# Patient Record
Sex: Male | Born: 1950 | Race: Black or African American | Hispanic: No
Health system: Southern US, Community
[De-identification: ages and names within clinical notes are randomized; demographics above are authoritative.]

## PROBLEM LIST (undated history)

## (undated) DIAGNOSIS — R0789 Other chest pain: Secondary | ICD-10-CM

## (undated) DIAGNOSIS — G4730 Sleep apnea, unspecified: Secondary | ICD-10-CM

## (undated) DIAGNOSIS — N289 Disorder of kidney and ureter, unspecified: Secondary | ICD-10-CM

## (undated) DIAGNOSIS — Z0000 Encounter for general adult medical examination without abnormal findings: Secondary | ICD-10-CM

## (undated) DIAGNOSIS — K5790 Diverticulosis of intestine, part unspecified, without perforation or abscess without bleeding: Secondary | ICD-10-CM

## (undated) DIAGNOSIS — D509 Iron deficiency anemia, unspecified: Secondary | ICD-10-CM

## (undated) DIAGNOSIS — B019 Varicella without complication: Secondary | ICD-10-CM

## (undated) DIAGNOSIS — E079 Disorder of thyroid, unspecified: Secondary | ICD-10-CM

## (undated) DIAGNOSIS — I10 Essential (primary) hypertension: Secondary | ICD-10-CM

## (undated) DIAGNOSIS — G4733 Obstructive sleep apnea (adult) (pediatric): Secondary | ICD-10-CM

## (undated) DIAGNOSIS — K219 Gastro-esophageal reflux disease without esophagitis: Secondary | ICD-10-CM

## (undated) DIAGNOSIS — E119 Type 2 diabetes mellitus without complications: Secondary | ICD-10-CM

## (undated) HISTORY — DX: Iron deficiency anemia, unspecified: D50.9

## (undated) HISTORY — DX: Encounter for general adult medical examination without abnormal findings: Z00.00

## (undated) HISTORY — DX: Varicella without complication: B01.9

## (undated) HISTORY — DX: Disorder of kidney and ureter, unspecified: N28.9

## (undated) HISTORY — DX: Other chest pain: R07.89

## (undated) HISTORY — DX: Essential (primary) hypertension: I10

## (undated) HISTORY — DX: Type 2 diabetes mellitus without complications: E11.9

## (undated) HISTORY — DX: Obstructive sleep apnea (adult) (pediatric): G47.33

## (undated) HISTORY — PX: KNEE SURGERY: SHX244

## (undated) HISTORY — DX: Gastro-esophageal reflux disease without esophagitis: K21.9

## (undated) HISTORY — DX: Morbid (severe) obesity due to excess calories: E66.01

## (undated) HISTORY — DX: Diverticulosis of intestine, part unspecified, without perforation or abscess without bleeding: K57.90

## (undated) HISTORY — DX: Disorder of thyroid, unspecified: E07.9

## (undated) HISTORY — DX: Sleep apnea, unspecified: G47.30

---

## 2001-08-03 ENCOUNTER — Observation Stay (HOSPITAL_COMMUNITY): Admission: EM | Admit: 2001-08-03 | Discharge: 2001-08-04 | Payer: Self-pay | Admitting: Emergency Medicine

## 2004-03-07 ENCOUNTER — Ambulatory Visit: Payer: Self-pay | Admitting: Internal Medicine

## 2004-05-02 ENCOUNTER — Ambulatory Visit: Payer: Self-pay | Admitting: Internal Medicine

## 2005-03-22 ENCOUNTER — Encounter: Admission: RE | Admit: 2005-03-22 | Discharge: 2005-03-22 | Payer: Self-pay | Admitting: Orthopaedic Surgery

## 2005-03-26 ENCOUNTER — Ambulatory Visit (HOSPITAL_COMMUNITY): Admission: RE | Admit: 2005-03-26 | Discharge: 2005-03-26 | Payer: Self-pay | Admitting: Orthopaedic Surgery

## 2005-03-26 ENCOUNTER — Ambulatory Visit (HOSPITAL_BASED_OUTPATIENT_CLINIC_OR_DEPARTMENT_OTHER): Admission: RE | Admit: 2005-03-26 | Discharge: 2005-03-26 | Payer: Self-pay | Admitting: Orthopaedic Surgery

## 2005-04-12 ENCOUNTER — Ambulatory Visit: Payer: Self-pay | Admitting: Internal Medicine

## 2005-06-03 ENCOUNTER — Ambulatory Visit: Payer: Self-pay | Admitting: Internal Medicine

## 2005-08-26 ENCOUNTER — Ambulatory Visit: Payer: Self-pay | Admitting: Internal Medicine

## 2005-08-29 ENCOUNTER — Ambulatory Visit: Payer: Self-pay | Admitting: Internal Medicine

## 2005-09-04 ENCOUNTER — Emergency Department (HOSPITAL_COMMUNITY): Admission: EM | Admit: 2005-09-04 | Discharge: 2005-09-04 | Payer: Self-pay | Admitting: Emergency Medicine

## 2005-09-16 ENCOUNTER — Ambulatory Visit: Payer: Self-pay | Admitting: Internal Medicine

## 2005-09-18 ENCOUNTER — Encounter: Admission: RE | Admit: 2005-09-18 | Discharge: 2005-09-18 | Payer: Self-pay | Admitting: Internal Medicine

## 2005-10-28 ENCOUNTER — Ambulatory Visit: Payer: Self-pay | Admitting: Internal Medicine

## 2006-04-15 ENCOUNTER — Ambulatory Visit: Payer: Self-pay | Admitting: Internal Medicine

## 2006-04-29 ENCOUNTER — Ambulatory Visit: Payer: Self-pay | Admitting: Pulmonary Disease

## 2006-06-28 ENCOUNTER — Emergency Department (HOSPITAL_COMMUNITY): Admission: EM | Admit: 2006-06-28 | Discharge: 2006-06-28 | Payer: Self-pay | Admitting: Emergency Medicine

## 2006-07-09 ENCOUNTER — Ambulatory Visit: Payer: Self-pay | Admitting: Internal Medicine

## 2006-07-09 LAB — CONVERTED CEMR LAB
BUN: 21 mg/dL (ref 6–23)
Creatinine, Ser: 1.5 mg/dL (ref 0.4–1.5)
GFR calc Af Amer: 62 mL/min
GFR calc non Af Amer: 51 mL/min
Hgb A1c MFr Bld: 6.7 % — ABNORMAL HIGH (ref 4.6–6.0)
Potassium: 3.6 meq/L (ref 3.5–5.1)

## 2006-09-16 ENCOUNTER — Ambulatory Visit: Payer: Self-pay | Admitting: Internal Medicine

## 2006-09-16 LAB — CONVERTED CEMR LAB
Albumin: 3.6 g/dL (ref 3.5–5.2)
Basophils Absolute: 0 10*3/uL (ref 0.0–0.1)
Bilirubin, Direct: 0.1 mg/dL (ref 0.0–0.3)
CRP, High Sensitivity: 4 (ref 0.00–5.00)
Cholesterol: 148 mg/dL (ref 0–200)
Eosinophils Absolute: 0.2 10*3/uL (ref 0.0–0.6)
Eosinophils Relative: 2.9 % (ref 0.0–5.0)
GFR calc non Af Amer: 48 mL/min
Glucose, Bld: 86 mg/dL (ref 70–99)
HCT: 38 % — ABNORMAL LOW (ref 39.0–52.0)
Hemoglobin: 13.1 g/dL (ref 13.0–17.0)
Hgb A1c MFr Bld: 6.6 % — ABNORMAL HIGH (ref 4.6–6.0)
Lymphocytes Relative: 30.5 % (ref 12.0–46.0)
MCHC: 34.4 g/dL (ref 30.0–36.0)
MCV: 76.2 fL — ABNORMAL LOW (ref 78.0–100.0)
Monocytes Absolute: 0.3 10*3/uL (ref 0.2–0.7)
Neutrophils Relative %: 60.8 % (ref 43.0–77.0)
Potassium: 4.5 meq/L (ref 3.5–5.1)
Sodium: 144 meq/L (ref 135–145)
Total Bilirubin: 1 mg/dL (ref 0.3–1.2)
Total CHOL/HDL Ratio: 4.2

## 2006-10-14 ENCOUNTER — Ambulatory Visit: Payer: Self-pay | Admitting: Internal Medicine

## 2006-12-15 ENCOUNTER — Ambulatory Visit: Payer: Self-pay | Admitting: Internal Medicine

## 2006-12-15 LAB — CONVERTED CEMR LAB
Basophils Absolute: 0 10*3/uL (ref 0.0–0.1)
Calcium: 9.7 mg/dL (ref 8.4–10.5)
Chloride: 107 meq/L (ref 96–112)
Creatinine, Ser: 1.6 mg/dL — ABNORMAL HIGH (ref 0.4–1.5)
Eosinophils Absolute: 0.2 10*3/uL (ref 0.0–0.6)
GFR calc non Af Amer: 48 mL/min
HCT: 37.1 % — ABNORMAL LOW (ref 39.0–52.0)
Hgb A1c MFr Bld: 6.7 % — ABNORMAL HIGH (ref 4.6–6.0)
Iron: 66 ug/dL (ref 42–165)
MCHC: 33.3 g/dL (ref 30.0–36.0)
MCV: 77.8 fL — ABNORMAL LOW (ref 78.0–100.0)
Neutrophils Relative %: 63.8 % (ref 43.0–77.0)
Platelets: 230 10*3/uL (ref 150–400)
RBC: 4.76 M/uL (ref 4.22–5.81)
RDW: 14.2 % (ref 11.5–14.6)
Saturation Ratios: 19.4 % — ABNORMAL LOW (ref 20.0–50.0)
Sodium: 145 meq/L (ref 135–145)
Transferrin: 242.9 mg/dL (ref >=212.0)
WBC: 7.3 10*3/uL (ref 4.5–10.5)

## 2007-02-18 DIAGNOSIS — I10 Essential (primary) hypertension: Secondary | ICD-10-CM

## 2007-02-18 DIAGNOSIS — D539 Nutritional anemia, unspecified: Secondary | ICD-10-CM | POA: Insufficient documentation

## 2007-02-18 DIAGNOSIS — E039 Hypothyroidism, unspecified: Secondary | ICD-10-CM | POA: Insufficient documentation

## 2007-02-18 DIAGNOSIS — E1165 Type 2 diabetes mellitus with hyperglycemia: Secondary | ICD-10-CM | POA: Insufficient documentation

## 2007-02-18 DIAGNOSIS — N259 Disorder resulting from impaired renal tubular function, unspecified: Secondary | ICD-10-CM | POA: Insufficient documentation

## 2007-02-18 DIAGNOSIS — E1159 Type 2 diabetes mellitus with other circulatory complications: Secondary | ICD-10-CM

## 2007-07-16 ENCOUNTER — Ambulatory Visit: Payer: Self-pay | Admitting: Pulmonary Disease

## 2007-07-16 ENCOUNTER — Encounter: Payer: Self-pay | Admitting: Adult Health

## 2007-07-16 ENCOUNTER — Ambulatory Visit: Payer: Self-pay | Admitting: Internal Medicine

## 2007-07-16 DIAGNOSIS — R0602 Shortness of breath: Secondary | ICD-10-CM | POA: Insufficient documentation

## 2007-07-20 LAB — CONVERTED CEMR LAB
BUN: 17 mg/dL (ref 6–23)
CO2: 33 meq/L — ABNORMAL HIGH (ref 19–32)
Chloride: 104 meq/L (ref 96–112)
Eosinophils Relative: 2.2 % (ref 0.0–5.0)
GFR calc Af Amer: 57 mL/min
Glucose, Bld: 86 mg/dL (ref 70–99)
Lymphocytes Relative: 26.3 % (ref 12.0–46.0)
Monocytes Absolute: 0.5 10*3/uL (ref 0.1–1.0)
Monocytes Relative: 7.7 % (ref 3.0–12.0)
Neutrophils Relative %: 63.6 % (ref 43.0–77.0)
Platelets: 232 10*3/uL (ref 150–400)
Potassium: 4.3 meq/L (ref 3.5–5.1)
RDW: 14.2 % (ref 11.5–14.6)
Sodium: 144 meq/L (ref 135–145)
WBC: 6.8 10*3/uL (ref 4.5–10.5)

## 2007-07-22 ENCOUNTER — Encounter: Payer: Self-pay | Admitting: Internal Medicine

## 2007-07-22 ENCOUNTER — Ambulatory Visit (HOSPITAL_BASED_OUTPATIENT_CLINIC_OR_DEPARTMENT_OTHER): Admission: RE | Admit: 2007-07-22 | Discharge: 2007-07-22 | Payer: Self-pay | Admitting: Internal Medicine

## 2007-07-28 ENCOUNTER — Ambulatory Visit: Payer: Self-pay | Admitting: Pulmonary Disease

## 2007-08-10 ENCOUNTER — Ambulatory Visit: Payer: Self-pay | Admitting: Internal Medicine

## 2007-08-12 ENCOUNTER — Ambulatory Visit: Payer: Self-pay

## 2007-08-13 ENCOUNTER — Encounter: Payer: Self-pay | Admitting: Adult Health

## 2007-08-13 ENCOUNTER — Ambulatory Visit: Payer: Self-pay

## 2007-08-24 ENCOUNTER — Ambulatory Visit: Payer: Self-pay | Admitting: Internal Medicine

## 2007-08-27 ENCOUNTER — Encounter: Admission: RE | Admit: 2007-08-27 | Discharge: 2007-08-27 | Payer: Self-pay | Admitting: Internal Medicine

## 2007-08-27 ENCOUNTER — Encounter: Payer: Self-pay | Admitting: Internal Medicine

## 2007-08-27 DIAGNOSIS — G4733 Obstructive sleep apnea (adult) (pediatric): Secondary | ICD-10-CM

## 2007-08-28 ENCOUNTER — Telehealth (INDEPENDENT_AMBULATORY_CARE_PROVIDER_SITE_OTHER): Payer: Self-pay | Admitting: *Deleted

## 2007-09-15 ENCOUNTER — Encounter: Payer: Self-pay | Admitting: Internal Medicine

## 2007-10-05 ENCOUNTER — Ambulatory Visit: Payer: Self-pay | Admitting: Pulmonary Disease

## 2007-10-11 ENCOUNTER — Encounter: Payer: Self-pay | Admitting: Pulmonary Disease

## 2007-10-16 ENCOUNTER — Telehealth (INDEPENDENT_AMBULATORY_CARE_PROVIDER_SITE_OTHER): Payer: Self-pay | Admitting: *Deleted

## 2007-10-28 ENCOUNTER — Telehealth (INDEPENDENT_AMBULATORY_CARE_PROVIDER_SITE_OTHER): Payer: Self-pay | Admitting: *Deleted

## 2007-11-05 ENCOUNTER — Ambulatory Visit: Payer: Self-pay | Admitting: Internal Medicine

## 2007-11-05 DIAGNOSIS — K589 Irritable bowel syndrome without diarrhea: Secondary | ICD-10-CM | POA: Insufficient documentation

## 2007-11-05 LAB — CONVERTED CEMR LAB
Alkaline Phosphatase: 65 units/L (ref 39–117)
Basophils Absolute: 0 10*3/uL (ref 0.0–0.1)
Bilirubin, Direct: 0.1 mg/dL (ref 0.0–0.3)
Calcium: 9.6 mg/dL (ref 8.4–10.5)
Cholesterol: 144 mg/dL (ref 0–200)
GFR calc Af Amer: 54 mL/min
HCT: 36.6 % — ABNORMAL LOW (ref 39.0–52.0)
HDL: 37.3 mg/dL — ABNORMAL LOW (ref >39.0)
Hemoglobin: 12.4 g/dL — ABNORMAL LOW (ref 13.0–17.0)
LDL Cholesterol: 96 mg/dL (ref 0–99)
Lymphocytes Relative: 22.7 % (ref 12.0–46.0)
MCHC: 33.8 g/dL (ref 30.0–36.0)
Monocytes Absolute: 0.4 10*3/uL (ref 0.1–1.0)
Neutro Abs: 4.1 10*3/uL (ref 1.4–7.7)
Platelets: 233 10*3/uL (ref 150–400)
Potassium: 4 meq/L (ref 3.5–5.1)
RDW: 14.6 % (ref 11.5–14.6)
Sodium: 140 meq/L (ref 135–145)
Total Bilirubin: 1.1 mg/dL (ref 0.3–1.2)
Total CHOL/HDL Ratio: 3.9
Triglycerides: 56 mg/dL (ref 0–149)

## 2007-11-11 ENCOUNTER — Encounter: Payer: Self-pay | Admitting: Internal Medicine

## 2007-11-19 ENCOUNTER — Ambulatory Visit: Payer: Self-pay | Admitting: Internal Medicine

## 2007-11-23 LAB — CONVERTED CEMR LAB: Hgb A1c MFr Bld: 6.2 % — ABNORMAL HIGH (ref 4.6–6.0)

## 2007-12-09 ENCOUNTER — Encounter: Payer: Self-pay | Admitting: Internal Medicine

## 2008-08-03 ENCOUNTER — Ambulatory Visit: Payer: Self-pay | Admitting: Internal Medicine

## 2008-08-03 LAB — CONVERTED CEMR LAB
Creatinine,U: 169.1 mg/dL
Microalb Creat Ratio: 1.8 mg/g (ref 0.0–30.0)
Microalb, Ur: 0.3 mg/dL (ref 0.0–1.9)

## 2008-08-04 ENCOUNTER — Encounter: Payer: Self-pay | Admitting: Adult Health

## 2008-08-04 ENCOUNTER — Ambulatory Visit: Payer: Self-pay | Admitting: Pulmonary Disease

## 2008-08-04 DIAGNOSIS — R42 Dizziness and giddiness: Secondary | ICD-10-CM

## 2008-08-09 ENCOUNTER — Ambulatory Visit: Payer: Self-pay | Admitting: Internal Medicine

## 2008-08-09 LAB — CONVERTED CEMR LAB
Basophils Absolute: 0.1 10*3/uL (ref 0.0–0.1)
Basophils Relative: 0.8 % (ref 0.0–3.0)
Bilirubin Urine: NEGATIVE
Calcium: 9.6 mg/dL (ref 8.4–10.5)
Eosinophils Absolute: 0.2 10*3/uL (ref 0.0–0.7)
Eosinophils Relative: 3 % (ref 0.0–5.0)
GFR calc non Af Amer: 61.52 mL/min (ref >60)
Hgb A1c MFr Bld: 6.3 % (ref 4.6–6.5)
Ketones, ur: NEGATIVE mg/dL
Leukocytes, UA: NEGATIVE
Lymphocytes Relative: 22.1 % (ref 12.0–46.0)
MCHC: 34.7 g/dL (ref 30.0–36.0)
Monocytes Relative: 6.7 % (ref 3.0–12.0)
Neutrophils Relative %: 67.4 % (ref 43.0–77.0)
RBC: 4.79 M/uL (ref 4.22–5.81)
Sodium: 141 meq/L (ref 135–145)
Specific Gravity, Urine: 1.01 (ref 1.000–1.030)
TSH: 5.85 microintl units/mL — ABNORMAL HIGH (ref 0.35–5.50)
Urine Glucose: NEGATIVE mg/dL
WBC: 8 10*3/uL (ref 4.5–10.5)
pH: 7 (ref 5.0–8.0)

## 2008-10-03 ENCOUNTER — Encounter: Payer: Self-pay | Admitting: Pulmonary Disease

## 2008-10-04 ENCOUNTER — Ambulatory Visit: Payer: Self-pay | Admitting: Pulmonary Disease

## 2009-01-01 ENCOUNTER — Encounter: Payer: Self-pay | Admitting: Pulmonary Disease

## 2009-01-23 ENCOUNTER — Encounter: Payer: Self-pay | Admitting: Internal Medicine

## 2009-02-17 ENCOUNTER — Encounter: Payer: Self-pay | Admitting: Pulmonary Disease

## 2009-06-13 ENCOUNTER — Telehealth (INDEPENDENT_AMBULATORY_CARE_PROVIDER_SITE_OTHER): Payer: Self-pay | Admitting: *Deleted

## 2009-06-13 ENCOUNTER — Ambulatory Visit: Payer: Self-pay | Admitting: Internal Medicine

## 2009-06-13 DIAGNOSIS — N401 Enlarged prostate with lower urinary tract symptoms: Secondary | ICD-10-CM

## 2009-06-13 DIAGNOSIS — E7800 Pure hypercholesterolemia, unspecified: Secondary | ICD-10-CM

## 2009-06-13 LAB — CONVERTED CEMR LAB
ALT: 32 units/L (ref 0–53)
AST: 34 units/L (ref 0–37)
Alkaline Phosphatase: 65 units/L (ref 39–117)
BUN: 14 mg/dL (ref 6–23)
Basophils Relative: 0.8 % (ref 0.0–3.0)
Bilirubin, Direct: 0.2 mg/dL (ref 0.0–0.3)
CRP, High Sensitivity: 6.5 — ABNORMAL HIGH (ref 0.00–5.00)
Calcium: 9.6 mg/dL (ref 8.4–10.5)
Chloride: 108 meq/L (ref 96–112)
Cholesterol: 151 mg/dL (ref 0–200)
Creatinine, Ser: 1.4 mg/dL (ref 0.4–1.5)
Eosinophils Relative: 2.6 % (ref 0.0–5.0)
GFR calc non Af Amer: 66.42 mL/min (ref >60)
Hgb A1c MFr Bld: 6.7 % — ABNORMAL HIGH (ref 4.6–6.5)
Ketones, ur: NEGATIVE mg/dL
LDL Cholesterol: 94 mg/dL (ref 0–99)
Leukocytes, UA: NEGATIVE
Lymphocytes Relative: 32.2 % (ref 12.0–46.0)
Monocytes Absolute: 0.3 10*3/uL (ref 0.1–1.0)
Monocytes Relative: 5 % (ref 3.0–12.0)
Neutrophils Relative %: 59.4 % (ref 43.0–77.0)
Platelets: 205 10*3/uL (ref 150.0–400.0)
Pro B Natriuretic peptide (BNP): 26 pg/mL (ref 0.0–100.0)
RBC: 4.98 M/uL (ref 4.22–5.81)
Specific Gravity, Urine: 1.02 (ref 1.000–1.030)
Total Bilirubin: 0.9 mg/dL (ref 0.3–1.2)
Total CHOL/HDL Ratio: 3
Total Protein, Urine: NEGATIVE mg/dL
Total Protein: 7.7 g/dL (ref 6.0–8.3)
Triglycerides: 52 mg/dL (ref 0.0–149.0)
VLDL: 10.4 mg/dL (ref 0.0–40.0)
WBC: 6.5 10*3/uL (ref 4.5–10.5)
pH: 6.5 (ref 5.0–8.0)

## 2009-06-28 ENCOUNTER — Ambulatory Visit: Payer: Self-pay | Admitting: Internal Medicine

## 2009-09-12 ENCOUNTER — Encounter: Payer: Self-pay | Admitting: Internal Medicine

## 2009-09-26 ENCOUNTER — Encounter: Payer: Self-pay | Admitting: Internal Medicine

## 2009-10-06 ENCOUNTER — Emergency Department (HOSPITAL_COMMUNITY): Admission: EM | Admit: 2009-10-06 | Discharge: 2009-10-07 | Payer: Self-pay | Admitting: Emergency Medicine

## 2009-10-24 ENCOUNTER — Ambulatory Visit: Payer: Self-pay | Admitting: Internal Medicine

## 2009-10-24 ENCOUNTER — Telehealth: Payer: Self-pay | Admitting: Internal Medicine

## 2009-10-30 LAB — CONVERTED CEMR LAB
Calcium: 9.7 mg/dL (ref 8.4–10.5)
Chloride: 104 meq/L (ref 96–112)
Creatinine, Ser: 1.6 mg/dL — ABNORMAL HIGH (ref 0.4–1.5)
Hgb A1c MFr Bld: 6 % (ref 4.6–6.5)
Sodium: 140 meq/L (ref 135–145)
TSH: 3.79 microintl units/mL (ref 0.35–5.50)

## 2009-12-12 ENCOUNTER — Encounter: Payer: Self-pay | Admitting: Internal Medicine

## 2010-01-26 ENCOUNTER — Encounter: Payer: Self-pay | Admitting: Internal Medicine

## 2010-02-01 ENCOUNTER — Telehealth: Payer: Self-pay | Admitting: Internal Medicine

## 2010-02-01 ENCOUNTER — Encounter: Payer: Self-pay | Admitting: Internal Medicine

## 2010-02-14 ENCOUNTER — Encounter: Payer: Self-pay | Admitting: Internal Medicine

## 2010-05-10 NOTE — Miscellaneous (Signed)
Summary: change of omeprazole to pantoprozole  Medications Added PANTOPRAZOLE SODIUM 40 MG TBEC (PANTOPRAZOLE SODIUM) take one tablet by mouth once daily       Clinical Lists Changes  Medications: Changed medication from OMEPRAZOLE 20 MG  CPDR (OMEPRAZOLE) Take 1 tablet by mouth once a day to PANTOPRAZOLE SODIUM 40 MG TBEC (PANTOPRAZOLE SODIUM) take one tablet by mouth once daily - Signed Rx of PANTOPRAZOLE SODIUM 40 MG TBEC (PANTOPRAZOLE SODIUM) take one tablet by mouth once daily;  #30 x 5;  Signed;  Entered by: Randell Loop CMA;  Authorized by: Nyoka Cowden MD;  Method used: [facility] [address] [phone]*, [address], [place], [place], [place]  [id number], Ph: [phone] or [phone], Fax: [phone]    Prescriptions: PANTOPRAZOLE SODIUM 40 MG TBEC (PANTOPRAZOLE SODIUM) take one tablet by mouth once daily  #30 x 5   Entered by:   Randell Loop CMA   Authorized by:   Nyoka Cowden MD   Signed by:   Randell Loop CMA on 01/26/2010   Method used:   [facility] to        [facility]  [address] [phone]* (retail)       [address]       [place], [place]  [id number]       Ph: [phone] or [phone]       Fax: [phone]   RxID:   [id number]  this replaces the omeprazole 20mg  Randell Loop [facility]  January 26, 2010 4:25 PM

## 2010-05-10 NOTE — Letter (Signed)
Summary: [facility]   Imported By: Lester Starrucca 10/02/2009 10:45:51  _____________________________________________________________________  External Attachment:    Type:   Image     Comment:   External Document

## 2010-05-10 NOTE — Assessment & Plan Note (Signed)
Summary: Primary svc/ cpx   Copy to:  Matthew Keller Primary Provider/Referring Provider:  [name]   History of Present Illness: 58  yobm  with  AO DM, HTN,and hyperlipidemia, morbid obesity & obstructive sleep apnea. 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  08/04/08 ov  cc  dizziness 5 days ago . He describes loose stools prior to this episode. Put on meclizine which makes him drowsy but helps. c/o Lt tinnitus x 4 y, neg ENT evaluation. On 3 BP meds, Orthostatic on exam with reproducible dizziness. Labs OK, cr 1.5  rec meclizine, no driving while dizzy or using meclizine.  October 04, 2008 Vassie Loll ov Sleep Medicine FU >> dizziness has not recurred, went to [place] for a 9 day trip, took CPAP, using it more than 4h/ night, Epworth Sleepiness Score 5, denies daytime drowsiness or problems driving. Nasal pillows work well, c/o water in tubing sometimes.  June 13, 2009 CPX new doe steps only,  x months otherwise no sob.  Pt denies any significant sore throat, dysphagia, itching, sneezing,  nasal congestion or excess secretions,  fever, chills, sweats, unintended wt loss, pleuritic or exertional cp, hempoptysis, change in activity tolerance  orthopnea pnd or leg swelling.  no tia or claudication.  Current Medications (verified): 1)  Omeprazole 20 Mg  Cpdr (Omeprazole) .... Take 1 Tablet By Mouth Once A Day 2)  Diovan Hct 320-25 Mg  Tabs (Valsartan-Hydrochlorothiazide) .... Take 1 Tablet By Mouth Once A Day 3)  Minoxidil 2.5 Mg  Tabs (Minoxidil) .... 2 Tabs Once Daily 4)  Verapamil Hcl Cr 240 Mg  Tbcr (Verapamil Hcl) .... Take 1 Tablet By Mouth Once A Day 5)  Levothyroxine Sodium 50 Mcg Tabs (Levothyroxine Sodium) .... Take 1 Tablet By Mouth Once A Day 6)  Actos 30 Mg  Tabs (Pioglitazone Hcl) .... Take 1 Tablet By Mouth Once A Day 7)  Glimepiride 2 Mg  Tabs (Glimepiride) .... Take 1 Tablet By Mouth Once A Day 8)  Citrucel   Powd (Methylcellulose (Laxative)) .... As Needed 9)  Autocpap ....  Use At Bedtime 10)  Veramyst 27.5 Mcg/spray  Susp (Fluticasone Furoate) .... As Needed For Nasal Congestion 11)  Tylenol 325 Mg  Tabs (Acetaminophen) .... Per Bottle 12)  Sulindac 200 Mg  Tabs (Sulindac) .... Take 1 Tablet By Mouth Two Times A Day As Needed 13)  Onetouch Ultra Test   Strp (Glucose Blood) .... 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 Ross  - LHC 08/03/01 nl coronaries and lv fn, lvedp 30  ~Cardiac w/u by Dr Tenny Craw 5/09 neg cardiolite for ischemia, rec risk reduction  DIVERTICULOSIS  colonoscopy 9/01..............................................................R  Kaplan  Severe OSA- on cpap, sleep study 09/2007,..................................................Marland KitchenDr. Vassie Loll       (HE IS COMMERCIAL BUS DRIVER) HEALTH MAINTENANCE..................................................................................Marland KitchenWert     - Pneumovax 09/2006     - Td 04/2004     - CPX June 13, 2009   Past Pulmonary History:  Pulmonary History: PSG shows obstructive sleep apnea with AHI 12/h, increases during REM to 50/h, lowest desaturation 75% c/w severe OSA. 6/09>> set up auto CPAP 5-20 cm, humidifier & nasal pillows  7/09>>reviewed auto CPAP download 5/29 to 7/5 , poor compliance , pressure 11.4  Family History: Reviewed history from 11/05/2007 and no changes required. Throat ca in brother who smoked Etoh in one brother neg premature ascvd DM in mother  Social History: Reviewed history from 08/03/2008 and no changes required. 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 Nl external ear canals without cough reflex NECK :  without JVD/Nodes/TM/ nl carotid upstrokes bilaterally LUNGS: no acc muscle use, clear to A and P bilaterally without cough on insp or exp maneuvers CV:  RRR  no s3 or murmur or increase in P2, no edema  ABD:  soft and nontender with nl excursion in the supine position. No bruits or organomegaly, bowel sounds nl MS:  warm without deformities, calf tenderness, cyanosis or clubbing SKIN: warm and dry without lesions   NEURO:  alert, approp, no deficits GU testes down bilaterally no nodules no IH Rectal mild bph, no mass. stool g neg    Cholesterol               151 mg/dL                   1-610     ATP III Classification            Desirable:  < 200 mg/dL                    Borderline High:  200 - 239 mg/dL               High:  > = 240 mg/dL   Triglycerides             52.0 mg/dL                  9.6-045.4     Normal:  <150 mg/dL     Borderline High:  098 - 199 mg/dL   HDL                       11.91 mg/dL                 >47.82   VLDL Cholesterol          10.4 mg/dL                  9.5-62.1   LDL Cholesterol           94 mg/dL                    3-08  CHO/HDL Ratio:  CHD Risk  3                    Men          Women     1/2 Average Risk     3.4          3.3     Average Risk          5.0          4.4     2X  Average Risk          9.6          7.1     3X Average Risk          15.0          11.0                           Tests: (2) BMP (METABOL)   Sodium                    143 mEq/L                   135-145   Potassium                 4.0 mEq/L                   3.5-5.1   Chloride                  108 mEq/L                   96-112   Carbon Dioxide       [H]  33 mEq/L                    19-32   Glucose              [H]  135 mg/dL                   21-30   BUN                       14 mg/dL                    8-65   Creatinine                1.4 mg/dL                   7.8-4.6   Calcium                   9.6 mg/dL                   9.6-29.5   GFR                       66.42 mL/min                >60  Tests: (3) CBC Platelet w/Diff (CBCD)   White Cell Count          6.5 K/uL                    4.5-10.5   Red Cell Count            4.98 Mil/uL  4.22-5.81   Hemoglobin                13.2 g/dL                   29.5-28.4   Hematocrit                40.3 %                      39.0-52.0   MCV                       80.9 fl                     78.0-100.0   MCHC                      32.7 g/dL                   13.2-44.0   RDW                       14.1 %                      11.5-14.6   Platelet Count            205.0 K/uL                  150.0-400.0   Neutrophil %              59.4 %                      43.0-77.0   Lymphocyte %              32.2 %                      12.0-46.0   Monocyte %                5.0 %                       3.0-12.0   Eosinophils%              2.6 %                       0.0-5.0   Basophils %               0.8 %                       0.0-3.0   Neutrophill Absolute      3.8 K/uL                    1.4-7.7   Lymphocyte Absolute       2.1 K/uL                    0.7-4.0   Monocyte Absolute         0.3 K/uL                    0.1-1.0  Eosinophils, Absolute                             0.2 K/uL  0.0-0.7   Basophils Absolute        0.1 K/uL                     0.0-0.1  Tests: (4) Hepatic/Liver Function Panel (HEPATIC)   Total Bilirubin           0.9 mg/dL                   0.4-5.4   Direct Bilirubin          0.2 mg/dL                   0.9-8.1   Alkaline Phosphatase      65 U/L                      39-117   AST                       34 U/L                      0-37   ALT                       32 U/L                      0-53   Total Protein             7.7 g/dL                    1.9-1.4   Albumin                   3.7 g/dL                    7.8-2.9  Tests: (5) TSH (TSH)   FastTSH                   3.72 uIU/mL                 0.35-5.50  Tests: (6) Full Range CRP (FCRP)   Full Range CRP       [H]  6.50 mg/L                   0.00-5.00     Note:  An elevated hs-CRP (>5 mg/L) should be repeated after 2 weeks to rule out recent infection or trauma.  Tests: (7) UDip Only (UDIP)   Color                     YELLOW       RANGE:  Yellow;Lt. Yellow   Clarity                   CLEAR                       Clear   Specific Gravity          1.020                       1.000 - 1.030   Urine Ph                  6.5  5.0-8.0   Protein                   NEGATIVE                    Negative   Urine Glucose             NEGATIVE                    Negative   Ketones                   NEGATIVE                    Negative   Urine Bilirubin           NEGATIVE                    Negative   Blood                     NEGATIVE                    Negative   Urobilinogen              0.2                         0.0 - 1.0   Leukocyte Esterace        NEGATIVE                    Negative   Nitrite                   NEGATIVE                    Negative  Tests: (8) Prostate Specific Antigen (PSA)   PSA-Hyb                   0.38 ng/mL                  0.10-4.00  Tests: (9) Hemoglobin A1C (A1C)   Hemoglobin A1C       [H]  6.7 %                       4.6-6.5  CXR  Procedure date:  06/13/2009  Findings:      Comparison:  11/05/2007   Findings: Decreased lung volumes with minimal basilar atelectasis. Normal heart size and vascularity.  No new superimposed pneumonia, edema, effusion or pneumothorax.  Midline trachea.  Overall stable exam.   IMPRESSION: Stable low volume exam with atelectasis  EKG  Procedure date:  06/13/2009  Findings:      nsr, wnl  Impression & Recommendations:  Problem # 1:  HYPERCHOLESTEROLEMIA (ICD-272.0)  Target < 70 LDL because of dm  Labs Reviewed: SGOT: 27 (11/05/2007)   SGPT: 23 (11/05/2007), wet   HDL:37.3 (11/05/2007), 35.4 (09/16/2006)  LDL:96 (11/05/2007),100 (09/16/2006)   LDL June 13, 2009 = 94  Chol:144 (11/05/2007), 148 (09/16/2006)  Trig:56 (11/05/2007), 63 (09/16/2006)  Problem # 2:  DYSPNEA (ICD-786.05) c/w deconditioning, reconditioning discussed.   Problem # 3:  DIABETES MELLITUS (ICD-250.00)  His updated medication list for this problem includes:    Actos 30 Mg Tabs (Pioglitazone hcl) .[name]... Take 1 tablet by mouth once a day    Glimepiride 2 Mg Tabs (Glimepiride) .[name]... Take  1 tablet by mouth once a day  Orders: Nutrition Referral (Nutrition) TLB-A1C / Hgb A1C (Glycohemoglobin) (83036-A1C)  Problem # 4:  RENAL INSUFFICIENCY (ICD-588.9) creat 1.5 > 1.4 June 13, 2009   Problem # 5:  HYPOTHYROIDISM, BORDERLINE (ICD-244.9)  His updated medication list for this problem includes:    Levothyroxine Sodium 50 Mcg Tabs (Levothyroxine sodium) .[name]... Take 1 tablet by mouth once a day  Labs Reviewed: TSH: 5.85 (08/03/2008)   > 3.72  June 13, 2009  HgBA1c: 6.3 (08/03/2008) Chol: 144 (11/05/2007)   HDL: 37.3 (11/05/2007)   LDL: 96 (11/05/2007)   TG: 56 (11/05/2007)  Other Orders: EKG w/ Interpretation (93000) T-2 View CXR (71020TC) TLB-Lipid Panel (80061-LIPID) TLB-BMP (Basic Metabolic Panel-BMET) (80048-METABOL) TLB-CBC Platelet - w/Differential (85025-CBCD) TLB-Hepatic/Liver Function Pnl (80076-HEPATIC) TLB-TSH (Thyroid Stimulating Hormone)  (84443-TSH) TLB-CRP-High Sensitivity (C-Reactive Protein) (86140-FCRP) TLB-Udip ONLY (81003-UDIP) TLB-PSA (Prostate Specific Antigen) (84153-PSA) TLB-BNP (B-Natriuretic Peptide) (83880-BNPR) Est. Patient 40-64 years (96295)  Patient Instructions: 1)  See Tammy NP w/in 2 weeks with all your medications, even over the counter meds, separated in two separate bags, the ones you take no matter what vs the ones you stop once you feel better and take only as needed.  She will generate for you a new user friendly medication calendar that will put [place] all on the same page re: your medication use. She will also go over your labs 2)  See Patient Care Coordinator before leaving for nutrition with a food diary 3)  Weight control is simply a matter of calorie balance which needs to be tilted in your favor by eating less and exercising more.  To get the most out of exercise, you need to be continuously aware that you are short of breath, but never out of breath, for 30 minutes daily. As you improve, it will actually be easier for you to do the same amount in  30 minutes so always push to the level where you are short of breath.    [facility] ECG  ID: [id number] Patient: Matthew Keller, Matthew Keller DOB: April 06, 1951 Age: 60 Years Old Sex: Male Race: Black Height: 71 Weight: 303 Status: Unconfirmed Past Medical History:  RENAL INSUFFICIENCY (ICD-588.9) Baseline 1.6 Creat DIABETES MELLITUS (ICD-250.00) ANEMIA, MICROCYTIC (ICD-281.9) HYPOTHYROIDISM, BORDERLINE (ICD-244.9) HYPERTENSION (ICD-401.9) MORBID OBESITY (ICD-278.01)   - All time high 310 2009   - Target wt  =  208  for BMI < 30 Atypical CP........................................................................................................P. Ross  - LHC 08/03/01 nl coronaries and lv fn, lvedp 30  ~Cardiac w/u by Dr Tenny Craw 5/09 neg cardiolite for ischemia, rec risk reduction  DIVERTICULOSIS  colonoscopy  9/01..............................................................R  Kaplan  Severe OSA- on cpap, sleep study 09/2007,..................................................Marland KitchenDr. Vassie Loll       (HE IS COMMERCIAL BUS DRIVER) HEALTH MAINTENANCE..................................................................................Marland KitchenWert  Recorded: 06/13/2009 09:18 AM P/PR: 113 ms / 157 ms - Heart rate (maximum exercise) QRS: 83 QT/QTc/QTd: 415 ms / 413 ms / 63 ms - Heart rate (maximum exercise)  P/QRS/T axis: 69 deg / 49 deg / 35 deg - Heart rate (maximum exercise)  Heartrate: 59 bpm  Interpretation:  nsr, wnl   CXR  Procedure date:  06/13/2009  Findings:      Comparison: 11/05/2007   Findings: Decreased lung volumes with minimal basilar atelectasis. Normal heart size and vascularity.  No new superimposed pneumonia, edema, effusion or pneumothorax.  Midline trachea.  Overall stable exam.   IMPRESSION: Stable low volume exam with atelectasis  EKG  Procedure date:  06/13/2009  Findings:  nsr, wnl

## 2010-05-10 NOTE — Assessment & Plan Note (Signed)
Summary: NP follow up - med calendar   Copy to:  Jarold Motto Primary Provider/Referring Provider:  [name]  CC:  est med calendar - pt did not bring any meds today.  no complaints..  History of Present Illness: 58  yobm  with  AO DM, HTN,and hyperlipidemia, morbid obesity & obstructive sleep apnea. 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  08/04/08 ov  cc  dizziness 5 days ago . He describes loose stools prior to this episode. Put on meclizine which makes him drowsy but helps. c/o Lt tinnitus x 4 y, neg ENT evaluation. On 3 BP meds, Orthostatic on exam with reproducible dizziness. Labs OK, cr 1.5  rec meclizine, no driving while dizzy or using meclizine.  October 04, 2008 Vassie Loll ov Sleep Medicine FU >> dizziness has not recurred, went to [place] for a 9 day trip, took CPAP, using it more than 4h/ night, Epworth Sleepiness Score 5, denies daytime drowsiness or problems driving. Nasal pillows work well, c/o water in tubing sometimes.  June 13, 2009 CPX new doe steps only,  x months otherwise no sob.  Pt denies any significant sore throat, dysphagia, itching, sneezing,  nasal congestion or excess secretions,  fever, chills, sweats, unintended wt loss, pleuritic or exertional cp, hempoptysis, change in activity tolerance  orthopnea pnd or leg swelling.  no tia or claudication.  June 28, 2009-Returns for follow up and med review. Unfortunately he did not bring meds today, we reviewed his list and update his med calendar. Gave him a new copy to bring along with his meds next ov. He has been doing well really working on diet, lost 8lbs, has had no fried foods. . Tolerating exericse in gym. Labs last visit showed good cholestrol on diet only. CRP was at  ~6. , A1C 6.7. Talked about diet, sweets and exercise. Denies chest pain, dyspnea, orthopnea, hemoptysis, fever, n/v/d, edema, headache.  Medications Prior to Update: 1)  Omeprazole 20 Mg  Cpdr (Omeprazole) .... Take 1 Tablet By Mouth Once A  Day 2)  Diovan Hct 320-25 Mg  Tabs (Valsartan-Hydrochlorothiazide) .... Take 1 Tablet By Mouth Once A Day 3)  Minoxidil 2.5 Mg  Tabs (Minoxidil) .... 2 Tabs Once Daily 4)  Verapamil Hcl Cr 240 Mg  Tbcr (Verapamil Hcl) .... Take 1 Tablet By Mouth Once A Day 5)  Levothyroxine Sodium 50 Mcg Tabs (Levothyroxine Sodium) .... Take 1 Tablet By Mouth Once A Day 6)  Actos 30 Mg  Tabs (Pioglitazone Hcl) .... Take 1 Tablet By Mouth Once A Day 7)  Glimepiride 2 Mg  Tabs (Glimepiride) .... Take 1 Tablet By Mouth Once A Day 8)  Citrucel   Powd (Methylcellulose (Laxative)) .... As Needed 9)  Autocpap .... Use At Bedtime 10)  Veramyst 27.5 Mcg/spray  Susp (Fluticasone Furoate) .... As Needed For Nasal Congestion 11)  Tylenol 325 Mg  Tabs (Acetaminophen) .... Per Bottle 12)  Sulindac 200 Mg  Tabs (Sulindac) .... Take 1 Tablet By Mouth Two Times A Day As Needed 13)  Onetouch Ultra Test   Strp (Glucose Blood) .... Use As Directed  Current Medications (verified): 1)  Omeprazole 20 Mg  Cpdr (Omeprazole) .... Take 1 Tablet By Mouth Once A Day 2)  Diovan Hct 320-25 Mg  Tabs (Valsartan-Hydrochlorothiazide) .... Take 1 Tablet By Mouth Once A Day 3)  Minoxidil 2.5 Mg  Tabs (Minoxidil) .... 2 Tabs Once Daily 4)  Verapamil Hcl Cr 240 Mg  Tbcr (Verapamil Hcl) .Marland KitchenMarland KitchenMarland Kitchen  Take 1 Tablet By Mouth Once A Day 5)  Levothyroxine Sodium 50 Mcg Tabs (Levothyroxine Sodium) .... Take 1 Tablet By Mouth Once A Day 6)  Actos 30 Mg  Tabs (Pioglitazone Hcl) .... Take 1 Tablet By Mouth Once A Day 7)  Glimepiride 2 Mg  Tabs (Glimepiride) .... Take 1 Tablet By Mouth Once A Day 8)  Citrucel   Powd (Methylcellulose (Laxative)) .... As Needed 9)  Autocpap .... Use At Bedtime 10)  Veramyst 27.5 Mcg/spray  Susp (Fluticasone Furoate) .... As Needed For Nasal Congestion 11)  Tylenol 325 Mg  Tabs (Acetaminophen) .... Per Bottle 12)  Sulindac 200 Mg  Tabs (Sulindac) .... Take 1 Tablet By Mouth Two Times A Day As Needed 13)  Onetouch Ultra Test   Strp  (Glucose Blood) .... 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 Ross  - LHC 08/03/01 nl coronaries and lv fn, lvedp 30  ~Cardiac w/u by Dr Tenny Craw 5/09 neg cardiolite for ischemia, rec risk reduction  DIVERTICULOSIS  colonoscopy 9/01..............................................................R  Kaplan  Severe OSA- on cpap, sleep study 09/2007,..................................................Marland KitchenDr. 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 6/09>> set up auto CPAP 5-20 cm, humidifier & nasal pillows  7/09>>reviewed auto CPAP download 5/29 to 7/5 , poor compliance , pressure 11.4  Review of Systems      See  HPI  Vital Signs:  Patient profile:   60 year old male Height:      71 inches Weight:      295 pounds BMI:     41.29 O2 Sat:      98 % on Room air Temp:     97.0 degrees F oral Pulse rate:   67 / minute BP sitting:   146 / 86  (left arm) Cuff size:   regular  Vitals Entered By: Boone Master CNA (June 28, 2009 9:49 AM)  O2 Flow:  Room air CC: est med calendar - pt did not bring any meds today.  no complaints. Is Patient Diabetic? Yes Comments Medications reviewed with patient Daytime contact number verified with patient. Boone Master CNA  June 28, 2009 9:48 AM    Physical Exam  Additional Exam:  wt 303 > 303 June 13, 2009>>295 June 28, 2009  obese amb bm nad HEENT: nl dentition, turbinates, and orophanx. Nl external ear canals without cough reflex NECK :  without JVD/Nodes/TM/ nl  carotid upstrokes bilaterally LUNGS: no acc muscle use, clear to A and P bilaterally without cough on insp or exp maneuvers CV:  RRR  no s3 or murmur or increase in P2, no edema  ABD:  soft and nontender with nl excursion in the supine position. No bruits or organomegaly, bowel sounds nl MS:  warm without deformities, calf tenderness, cyanosis or clubbing SKIN: warm and dry without lesions   NEURO:  alert, approp, no deficits     Impression & Recommendations:  Problem # 1:  HYPERCHOLESTEROLEMIA (ICD-272.0)  controlled on diet. LDL goal <70-100. would prefer to be <70 but for now cont w/ diet/exercise.  may need to add statin at some point w/ crp at  ~6 Labs Reviewed: SGOT: 34 (06/13/2009)   SGPT: 32 (06/13/2009)   HDL:46.30 (06/13/2009), 37.3 (11/05/2007)  LDL:94 (06/13/2009), 96 (11/05/2007)  Chol:151 (06/13/2009), 144 (11/05/2007)  Trig:52.0 (06/13/2009), 56 (11/05/2007)  Orders: Est. Patient Level IV (16109)  Problem # 2:  OBSTRUCTIVE SLEEP APNEA (ICD-327.23)  cont at bedtime CPAP  Orders: Est. Patient Level IV (60454)  Problem # 3:  DIABETES MELLITUS  (ICD-250.00)  controlled on rx. diet and exer cise. His updated medication list for this problem includes:    Actos 30 Mg Tabs (Pioglitazone hcl) .[name]... Take 1 tablet by mouth once a day    Glimepiride 2 Mg Tabs (Glimepiride) .[name]... Take 1 tablet by mouth once a day  Orders: Est. Patient Level IV (09811)  Problem # 4:  HYPERTENSION (ICD-401.9)  improved, cont on same meds Meds reviewed with pt education and computerized med calendar adjusted.  His updated medication list for this problem includes:    Diovan Hct 320-25 Mg Tabs (Valsartan-hydrochlorothiazide) .[name]... Take 1 tablet by mouth once a day    Minoxidil 2.5 Mg Tabs (Minoxidil) .[name]... 2 tabs once daily    Verapamil Hcl Cr 240 Mg Tbcr (Verapamil hcl) .[name]... Take 1 tablet by mouth once a day  BP today: 146/86 Prior BP: 176/108 (06/13/2009)  Labs Reviewed: K+: 4.0 (06/13/2009) Creat: : 1.4 (06/13/2009)   Chol: 151 (06/13/2009)   HDL: 46.30 (06/13/2009)   LDL: 94 (06/13/2009)   TG: 52.0 (06/13/2009)  Orders: Est. Patient Level IV (91478)  Complete Medication List: 1)  Omeprazole 20 Mg Cpdr (Omeprazole) .... Take 1 tablet by mouth once a day 2)  Diovan Hct 320-25 Mg Tabs (Valsartan-hydrochlorothiazide) .... Take 1 tablet by mouth once a day 3)  Minoxidil 2.5 Mg Tabs (Minoxidil) .... 2 tabs once daily 4)  Verapamil Hcl Cr 240 Mg Tbcr (Verapamil hcl) .... Take 1 tablet by mouth once a day 5)  Levothyroxine Sodium 50 Mcg Tabs (Levothyroxine sodium) .... Take 1 tablet by mouth once a day 6)  Actos 30 Mg Tabs (Pioglitazone hcl) .... Take 1 tablet by mouth once a day 7)  Glimepiride 2 Mg Tabs (Glimepiride) .... Take 1 tablet by mouth once a day 8)  Citrucel Powd (Methylcellulose (laxative)) .... As needed 9)  Autocpap  .... Use at bedtime 10)  Veramyst 27.5 Mcg/spray Susp (Fluticasone furoate) .... As needed for nasal congestion 11)  Tylenol 325 Mg Tabs (Acetaminophen) .... Per bottle 12)  Sulindac 200 Mg Tabs (Sulindac) .... Take 1  tablet by mouth two times a day as needed 13)  Onetouch Ultra Test Strp (Glucose blood) .... Use as directed  Patient Instructions: 1)  Follow med calendar closely and bring to each visit.  2)  follow up in 3 months Dr. [name]  3)  Keep up  good work. Goal is to lose 8lbs by return.   Appended Document: med calendar update Medications Added CITRUCEL   POWD (METHYLCELLULOSE (LAXATIVE)) 1 teaspoon  every morning and at bedtime VERAMYST 27.5 MCG/SPRAY  SUSP (FLUTICASONE FUROATE) 2 puffs each nostril two times a day as needed          Clinical Lists Changes  Medications: Changed medication from CITRUCEL   POWD (METHYLCELLULOSE (LAXATIVE)) as needed to CITRUCEL   POWD (METHYLCELLULOSE (LAXATIVE)) 1 teaspoon  every morning and at bedtime Changed medication from VERAMYST 27.5 MCG/SPRAY  SUSP (FLUTICASONE FUROATE) as needed for nasal congestion to VERAMYST 27.5 MCG/SPRAY  SUSP (FLUTICASONE FUROATE) 2 puffs each nostril two times a day as needed

## 2010-05-10 NOTE — Medication Information (Signed)
Summary: Tax adviser   Imported By: Valinda Hoar 02/14/2010 11:39:08  _____________________________________________________________________  External Attachment:    Type:   Image     Comment:   External Document

## 2010-05-10 NOTE — Assessment & Plan Note (Signed)
Summary: Primary svc/ f/u multiple issues, refer back to nutrition   Visit Type:  Follow-up Copy to:  Jarold Motto Primary Provider/Referring Provider:  [name]  CC:  The patient c/o ringing in his ears for several days. He was seen in ER approx 2 weeks ago for dehydration.[name]  History of Present Illness: 58  yobm  with  AO DM, HTN,and hyperlipidemia, morbid obesity & obstructive sleep apnea. 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  08/04/08 ov  cc  dizziness 5 days ago . He describes loose stools prior to this episode. Put on meclizine which makes him drowsy but helps. c/o Lt tinnitus x 4 y, neg ENT evaluation. On 3 BP meds, Orthostatic on exam with reproducible dizziness. Labs OK, cr 1.5  rec meclizine, no driving while dizzy or using meclizine.  October 04, 2008 Vassie Loll ov Sleep Medicine FU >> dizziness has not recurred, went to [place] for a 9 day trip, took CPAP, using it more than 4h/ night, Epworth Sleepiness Score 5, denies daytime drowsiness or problems driving. Nasal pillows work well, c/o water in tubing sometimes.  June 13, 2009 CPX new doe steps only,  x months otherwise no sob.  Pt denies any significant sore throat, dysphagia, itching, sneezing,  nasal congestion or excess secretions,  fever, chills, sweats, unintended wt loss, pleuritic or exertional cp, hempoptysis, change in activity tolerance  orthopnea pnd or leg swelling.  no tia or claudication.  June 28, 2009-Returns for follow up and med review. Unfortunately he did not bring meds today, we reviewed his list and update his med calendar. Gave him a new copy to bring along with his meds next ov. He has been doing well really working on diet, lost 8lbs, has had no fried foods. . Tolerating exericse in gym. Labs last visit showed good cholestrol on diet only. CRP was at  ~6. , A1C 6.7.   October 24, 2009 The patient c/o ringing in his ears for several days. He was seen in ER approx 2 weeks ago for dehydration, all symtpoms  resolved. Pt denies any significant sore throat, dysphagia, itching, sneezing,  nasal congestion or excess secretions,  fever, chills, sweats, unintended wt loss, pleuritic or exertional cp, hempoptysis, change in activity tolerance  orthopnea pnd or leg swelling   Current Medications (verified): 1)  Omeprazole 20 Mg  Cpdr (Omeprazole) .... Take 1 Tablet By Mouth Once A Day 2)  Diovan Hct 320-25 Mg  Tabs (Valsartan-Hydrochlorothiazide) .... Take 1 Tablet By Mouth Once A Day 3)  Minoxidil 2.5 Mg  Tabs (Minoxidil) .... 2 Tabs Once Daily 4)  Verapamil Hcl Cr 240 Mg  Tbcr (Verapamil Hcl) .... Take 1 Tablet By Mouth Once A Day 5)  Levothyroxine Sodium 50 Mcg Tabs (Levothyroxine Sodium) .... Take 1 Tablet By Mouth Once A Day 6)  Actos 30 Mg  Tabs (Pioglitazone Hcl) .... Take 1 Tablet By Mouth Once A Day 7)  Glimepiride 2 Mg  Tabs (Glimepiride) .... Take 1 Tablet By Mouth Once A Day 8)  Citrucel   Powd (Methylcellulose (Laxative)) .[name].. 1 Teaspoon  Every Morning and At Bedtime 9)  Autocpap .... Use At Bedtime 10)  Veramyst 27.5 Mcg/spray  Susp (Fluticasone Furoate) .... 2 Puffs Each Nostril Two Times A Day As Needed 11)  Tylenol 325 Mg  Tabs (Acetaminophen) .... Per Bottle 12)  Sulindac 200 Mg  Tabs (Sulindac) .... Take 1 Tablet By Mouth Two Times A Day As Needed 13)  Onetouch Ultra Test   Strp (Glucose Blood) .... Use As Directed  Allergies (verified): No Known Drug Allergies  Past History:  Past Medical History: RENAL INSUFFICIENCY (ICD-588.9)      -  Baseline 1.6 Creat > 1.6 October 24, 2009  DIABETES MELLITUS (ICD-250.00) ANEMIA, MICROCYTIC (ICD-281.9) HYPOTHYROIDISM, BORDERLINE (ICD-244.9) HYPERTENSION (ICD-401.9) MORBID OBESITY (ICD-278.01)   - All time high 310 2009   - Target wt  =  208  for BMI < 30   - Referred back to nutrition October 24, 2009 >>> Atypical CP........................................................................................................P. Ross  - LHC 08/03/01  nl coronaries and lv fn, lvedp 30  ~Cardiac w/u by Dr Tenny Craw 5/09 neg cardiolite for ischemia, rec risk reduction  DIVERTICULOSIS  colonoscopy 9/01..............................................................R  Kaplan  Severe OSA- on cpap, sleep study 09/2007,..................................................Marland KitchenDr. Vassie Loll       (HE IS COMMERCIAL BUS DRIVER) HEALTH MAINTENANCE..................................................................................Marland KitchenWert     - Pneumovax 09/2006     - Td 04/2004     - CPX June 13, 2009   Past Pulmonary History:  Pulmonary History: PSG shows obstructive sleep apnea with AHI 12/h, increases during REM to 50/h, lowest desaturation 75% c/w severe OSA. 6/09>> set up auto CPAP 5-20 cm, humidifier & nasal pillows  7/09>>reviewed auto CPAP download 5/29 to 7/5 , poor compliance , pressure 11.4  Vital Signs:  Patient profile:   60 year old male Height:      71 inches (180.34 cm) Weight:      300.13 pounds (136.42 kg) BMI:     42.01 O2 Sat:      93 % on Room air Temp:     98.5 degrees F (36.94 degrees C) oral Pulse rate:   92 / minute BP sitting:   154 / 82  (left arm) Cuff size:   large  Vitals Entered By: Michel Bickers CMA (October 24, 2009 2:11 PM)  O2 Sat at Rest %:  93 O2 Flow:  Room air CC: The patient c/o ringing in his ears for several days. He was seen in ER approx 2 weeks ago for dehydration. Comments Medications reviewed. Daytime phone verified. Michel Bickers [facility]  October 24, 2009 2:12 PM   Physical Exam  Additional Exam:  wt  303 June 13, 2009>> 295 June 28, 2009 > 303 October 24, 2009 > 300 October 24, 2009  obese amb bm nad HEENT: nl dentition, turbinates, and orophanx. Nl external ear canals without cough reflex NECK :  without JVD/Nodes/TM/ nl carotid upstrokes bilaterally LUNGS: no acc muscle use, clear to A and P bilaterally without cough on insp or exp maneuvers CV:  RRR  no s3 or murmur or increase in P2, no edema  ABD:  soft and nontender  with nl excursion in the supine position. No bruits or organomegaly, bowel sounds nl MS:  warm without deformities, calf tenderness, cyanosis or clubbing    Sodium                    140 mEq/L                   135-145   Potassium                 4.1 mEq/L                   3.5-5.1   Chloride                  104 mEq/L  96-112   Carbon Dioxide       [H]  33 mEq/L                    19-32   Glucose              [H]  151 mg/dL                   60-45   BUN                       23 mg/dL                    4-09   Creatinine           [H]  1.6 mg/dL                   8.1-1.9   Calcium                   9.7 mg/dL                   1.4-78.2   GFR                       58.55 mL/min                >60  Tests: (2) Hemoglobin A1C (A1C)   Hemoglobin A1C            6.0 %                       4.6-6.5     Glycemic Control Guidelines for People with Diabetes:     Non Diabetic:  <6%     Goal of Therapy: <7%     Additional Action Suggested:  >8%   Tests: (3) TSH (TSH)   FastTSH                   3.79 uIU/mL                 0.35-5.50  Impression & Recommendations:  Problem # 1:  HYPERTENSION (ICD-401.9)  His updated medication list for this problem includes:    Diovan Hct 320-25 Mg Tabs (Valsartan-hydrochlorothiazide) .[name]... Take 1 tablet by mouth once a day    Minoxidil 2.5 Mg Tabs (Minoxidil) .[name]... 2 tabs once daily    Verapamil Hcl Cr 240 Mg Tbcr (Verapamil hcl) .[name]... Take 1 tablet by mouth once a day  Orders: Est. Patient Level IV (95621)  Problem # 2:  DIABETES MELLITUS (ICD-250.00)  His updated medication list for this problem includes:    Actos 30 Mg Tabs (Pioglitazone hcl) .[name]... Take 1 tablet by mouth once a day    Glimepiride 2 Mg Tabs (Glimepiride) .[name]... Take 1 tablet by mouth once a day   Labs Reviewed: Creat: 1.4 (06/13/2009)    Reviewed HgBA1c results: 6.7 (06/13/2009) > 6.0 October 24, 2009   6.3 (08/03/2008)  Problem # 3:  HYPOTHYROIDISM, BORDERLINE  (ICD-244.9)  His updated medication list for this problem includes:    Levothyroxine Sodium 50 Mcg Tabs (Levothyroxine sodium) .[name]... Take 1 tablet by mouth once a day  Orders: TLB-TSH (Thyroid Stimulating Hormone) (84443-TSH)  Labs Reviewed: TSH: 3.72 (06/13/2009)    HgBA1c: 6.7 (06/13/2009)  > 3.79 October 24, 2009  Chol: 151 (06/13/2009)   HDL: 46.30 (06/13/2009)   LDL: 94 (06/13/2009)  TG: 52.0 (06/13/2009)  Problem # 4:  RENAL INSUFFICIENCY (ICD-588.9) Creat 1.6 > 1.6 October 24, 2009 so no change in rx  Other Orders: TLB-BMP (Basic Metabolic Panel-BMET) (80048-METABOL) TLB-A1C / Hgb A1C (Glycohemoglobin) (83036-A1C) Nutrition Referral (Nutrition)  Patient Instructions: 1)  Return to office in 3 months, sooner if needed  2)  See nutrition with food diary x 2 weeks

## 2010-05-10 NOTE — Progress Notes (Signed)
Summary: PA initiated for Pantoprazole approved  Phone Note Outgoing Call   Call placed by: Michel Bickers CMA,  February 01, 2010 11:07 AM Call placed to: [facility] [phone] Summary of Call: PA initiated for Pantoprazole 40MG  tabs. Case # [id number]. Awaiting fax. Initial call taken by: Michel Bickers CMA,  February 01, 2010 11:07 AM  Follow-up for Phone Call        PA form received and placed in MW look at for his review and signature.Michel Bickers CMA  February 01, 2010 11:22 AM  Cannot locate the form. I have called med recs scan dept to check to see if teh form was sent in his scan folder. will await call back. Carron Curie CMA  February 08, 2010 11:47 AM  Med recs does not have the PA form, so I called [facility] again to check on status and was advised medication had been approved from 01-15-10 to 02-05-11. Pharmacy notified. Carron Curie CMA  February 08, 2010 12:01 PM

## 2010-05-10 NOTE — Progress Notes (Signed)
Summary: Re-refer to Nutrition  Phone Note Call from Patient Call back at Home Phone [phone] Call back at [phone]   Caller: Patient Call For: Matthew Keller Summary of Call: needs to be sset back up with a [facility] nutritionist Initial call taken by: Lacinda Axon,  October 24, 2009 4:42 PM  Follow-up for Phone Call        called and spoke with pt.  pt states he saw MW  today.  Per instructions, pt was to f/u with nutritionist.  Pt states he spoke with the nutritionist at [facility] and  the order from March 2011 that MW had sent for a referral to a nutritionist has expired and pt needs a new referral sent.  Will forward message to MW.  Arman Filter LPN  October 24, 2009 4:49 PM  will reorderd Follow-up by: Nyoka Cowden MD,  October 24, 2009 4:52 PM     Appended Document: Orders Update     Clinical Lists Changes  Orders: Added new Referral order of Nutrition Referral (Nutrition) - Signed

## 2010-05-10 NOTE — Letter (Signed)
Summary: Unable to reach pt/[facility]  Unable to reach pt/[facility]   Imported By: Sherian Rein 09/20/2009 09:06:00  _____________________________________________________________________  External Attachment:    Type:   Image     Comment:   External Document

## 2010-05-10 NOTE — Progress Notes (Signed)
Summary: refills  Phone Note Call from Patient   Caller: Patient Call For: wert Summary of Call: pt need all of his  meds refilled  [facility] [address]  Initial call taken by: Rickard Patience,  June 13, 2009 10:51 AM  Follow-up for Phone Call        pt was just seen by MW today.  Rx sent to pharmacy.  LMOM informing pt rx sent to pharmacy.  Arman Filter LPN  June 13, 1608 10:55 AM     Prescriptions: [place] TEST   STRP (GLUCOSE BLOOD) use as directed  #100 x 11   Entered by:   Arman Filter LPN   Authorized by:   Nyoka Cowden MD   Signed by:   Arman Filter LPN on 96/07/5407   Method used:   [facility]        [facility]  [address] [phone][facility] (retail)       [address]       [place], [place]  [address]       Ph: [phone] or [phone]       Fax: [phone]   RxID:   [id number] SULINDAC 200 MG  TABS (SULINDAC) Take 1 tablet by mouth two times a day as needed  #60 x 5   Entered by:   Arman Filter LPN   Authorized by:   Nyoka Cowden MD   Signed by:   Arman Filter LPN on 87/56/4332   Method used:   [facility]ron[facility]        [facility] [phone]* (retail)       [address]       [place], [place]  [address]       Ph: [phone] or [phone]       Fax: [phone]   RxID:   [id number] GLIMEPIRIDE 2 MG  TABS (GLIMEPIRIDE) Take 1 tablet by mouth once a day  #30 x 5   Entered by:   Arman Filter LPN   Authorized by:   Nyoka Cowden MD   Signed by:   Arman Filter LPN on 94/85/4627   Method used:   Electronically to        [facility] [phone]* (retail)       [address]       [place], [place]  [address]       Ph: [phone] or [phone]       Fax: [phone]   RxID:   [id number] ACTOS 30 MG  TABS (PIOGLITAZONE HCL) Take 1 tablet by mouth once a day  #30 x 5   Entered by:   Arman Filter LPN   Authorized by:    Nyoka Cowden MD   Signed by:   Arman Filter LPN on 19/50/9326   Method used:   Electronically to        [facility] [phone]* (retail)       [address]       [place], [place]  [address]       Ph: [phone] or [phone]       Fax: [phone]   RxID:   [id number] VERAPAMIL HCL CR 240 MG  TBCR (VERAPAMIL HCL) Take 1 tablet by mouth once a day  #  30 x 5   Entered by:   Arman Filter LPN   Authorized by:   Nyoka Cowden MD   Signed by:   Arman Filter LPN on 81/19/1478   Method used:   Electronically to        [facility] [phone]* (retail)       [address]       [place], [place]  [address]       Ph: [phone] or [phone]       Fax: [phone]   RxID:   [id number] MINOXIDIL 2.5 MG  TABS (MINOXIDIL) 2 tabs once daily  #60 x 5   Entered by:   Arman Filter LPN   Authorized by:   Nyoka Cowden MD   Signed by:   Arman Filter LPN on 95/18/8416   Method used:   Electronically to        [facility] [phone]* (retail)       [address]       [place], [place]  [address]       Ph: [phone] or [phone]       Fax: [phone]   RxID:   [id number] DIOVAN HCT 320-25 MG  TABS (VALSARTAN-HYDROCHLOROTHIAZIDE) Take 1 tablet by mouth once a day  #30 x 0   Entered by:   Arman Filter LPN   Authorized by:   Nyoka Cowden MD   Signed by:   Arman Filter LPN on 03/50/0938   Method used:   Electronically to        [facility] [address] [phone]* (retail)       [address]       [place], [place]  [address]       Ph: [phone] or [phone]       Fax: [phone]   RxID:   [id number] OMEPRAZOLE 20 MG  CPDR (OMEPRAZOLE) Take 1 tablet by mouth once a day  #30 x 5   Entered by:   Arman Filter LPN   Authorized by:   Nyoka Cowden MD   Signed by:   Arman Filter LPN on 71/24/5809   Method used:    Electronically to        [facility] [address] [phone]* (retail)       [address]       [place], [place]  [address]       Ph: [phone] or [phone]       Fax: [phone]   RxID:   [id number]

## 2010-05-10 NOTE — Letter (Signed)
Summary: Missed Appt/[employer]Mg[employer]  Missed Appt/[employer]DBS-Mgmt   Imported By: Lester [place] 12/19/2009 08:47:22  _____________________________________________________________________  External Attachment:    Type:   Image     Comment:   External Document

## 2010-05-10 NOTE — Medication Information (Signed)
Summary: Prior Auth/medco  Prior Auth/medco   Imported By: Lester [facility] 02/14/2010 09:01:10  _____________________________________________________________________  External Attachment:    Type:   Image     Comment:   External Document

## 2010-05-15 ENCOUNTER — Telehealth: Payer: Self-pay | Admitting: Internal Medicine

## 2010-05-23 ENCOUNTER — Telehealth (INDEPENDENT_AMBULATORY_CARE_PROVIDER_SITE_OTHER): Payer: Self-pay | Admitting: *Deleted

## 2010-05-23 ENCOUNTER — Ambulatory Visit: Payer: Self-pay | Admitting: Internal Medicine

## 2010-05-24 ENCOUNTER — Telehealth: Payer: Self-pay | Admitting: Internal Medicine

## 2010-05-24 NOTE — Progress Notes (Signed)
Summary: refills of his Verapamil and Actos-  Phone Note Call from Patient   Caller: Patient Call For: Ailynn Gow Summary of Call: patient phoned and he wanted to speak to the nurse. Patient stated that [facility] [address] and they advised him that our office denied his varapamill and the Actos and patient wants to  know why he stated that the doctor told him that he would not deny his blood pressure medications. Patient can be reached at [phone] Patient last seen 10/2009. Patient has been out of his Verapami for three days. Initial call taken by: Vedia Coffer,  May 15, 2010 2:31 PM  Follow-up for Phone Call        LMTCBx1 pt needs appt per refill denial. Carron Curie CMA  May 15, 2010 4:56 PM  pt set to see MW on 05-23-10 at 9:15am. refills sent. Carron Curie CMA  May 15, 2010 5:03 PM     Prescriptions: GLIMEPIRIDE 2 MG  TABS (GLIMEPIRIDE) Take 1 tablet by mouth once a day  #30 Tablet x 0   Entered by:   Carron Curie CMA   Authorized by:   Nyoka Cowden MD   Signed by:   Carron Curie CMA on 05/15/2010   Method used:   Electronically to        [facility] [phone]* (retail)       [address]       [place], [place]  [address]       Ph: [phone] or [phone]       Fax: [phone]   RxID:   [id number] ACTOS 30 MG  TABS (PIOGLITAZONE HCL) Take 1 tablet by mouth once a day  #30 Tablet x 0   Entered by:   Carron Curie CMA   Authorized by:   Nyoka Cowden MD   Signed by:   Carron Curie CMA on 05/15/2010   Method used:   Electronically to        [facility] [phone]* (retail)       [address]       [place], [place]  [address]       Ph: [phone] or [phone]       Fax: [phone]   RxID:   [id number] VERAPAMIL HCL CR 240 MG  TBCR (VERAPAMIL HCL) Take 1 tablet by mouth once a day  #30 Tablet x 0   Entered by:    Carron Curie CMA   Authorized by:   Nyoka Cowden MD   Signed by:   Carron Curie CMA on 05/15/2010   Method used:   Electronically to        [facility] [phone]* (retail)       [address]       [place], [place]  [address]       Ph: [phone] or [phone]       Fax: [phone]   RxID:   [id number] PANTOPRAZOLE SODIUM 40 MG TBEC (PANTOPRAZOLE SODIUM) take one tablet by mouth once daily  #30 x 0   Entered by:   Carron Curie CMA   Authorized by:   Nyoka Cowden MD   Signed by:   Carron Curie CMA on 05/15/2010   Method used:  Electronically to        [facility] [phone]* (retail)       [address]       [place], [place]  [address]       Ph: [phone] or [phone]       Fax: [phone]   RxID:   [id number]

## 2010-05-29 ENCOUNTER — Telehealth (INDEPENDENT_AMBULATORY_CARE_PROVIDER_SITE_OTHER): Payer: Self-pay | Admitting: *Deleted

## 2010-05-30 NOTE — Progress Notes (Signed)
Summary: nos appt  Phone Note Call from Patient   Caller: [email or web address]  Call For: Caroleann Casler Summary of Call: [facility]sc nos from 2/15 to 3/6. Initial call taken by: Darletta Moll,  May 24, 2010 10:14 AM     Appended Document: nos appt see prev phone note

## 2010-06-05 NOTE — Progress Notes (Signed)
Summary: has concerns about Actos and bladder cancer  Phone Note Call from Patient   Caller: Patient Call For: wert Summary of Call: patient phoned stated that had a call from our office wasnt sure if it was regarding an appointment or about a question he had on his Actos because he has been seeing the commerical on TV stating that a side effect is that it can cause bladder cancer and his wife keeps asking him to call and see if still needs to stay on this medicine or if it needs to be changed. Patient stated that he isnt having any problems that he knows about. He can be reached at [phone]  Initial call taken by: Vedia Coffer,  May 29, 2010 11:33 AM  Follow-up for Phone Call        Spoke with pt and advised needs ov per last phone note to discuss this and no need to change meds for now.  Pt verbalized understanding.  I advised we can see him this wk if he can come in and he refused, stating wll need to just keep his sched appt for 3/6.   Follow-up by: Vernie Murders,  May 29, 2010 11:51 AM

## 2010-06-05 NOTE — Progress Notes (Signed)
Summary: NEEDS OV re ? change actos-LMTCBx4  Phone Note Call from Patient   Caller: Patient Call For: wert Summary of Call: pt requests refills of all meds. also wants to discuss actos and side effects "seen on tv". pt # [phone] (cell) [facility] [address] is scheduling pt for rov w/ dr wert for 06/12/10. Initial call taken by: Tivis Ringer, CNA,  May 23, 2010 1:51 PM  Follow-up for Phone Call        LMTCBx1. Carron Curie CMA  May 23, 2010 3:18 PM  Spoke with pt and he saw commercialon TV about Actos and how it has been causing liver and kidney problems in patients and he wants to [facility] what MW thinks about this and if he feels the pt should change medicaitons because he is concerned about the side effects. Please advise. Carron Curie CMA  May 23, 2010 4:17 PM no change needed for now but  strongly advise ov now and every 3 months - he is a complicated pt that needs closer f/u so we can address these issues effectively Follow-up by: Nyoka Cowden MD,  May 24, 2010 9:05 AM  Additional Follow-up for Phone Call Additional follow up Details #1::        LMTCBx1. Carron Curie CMA  May 24, 2010 9:23 AM  [facility] Gweneth Dimitri RN  May 25, 2010 5:36 PM  lmomtcb x 2  need to get pt in for an earlier appointment with MW. Zackery Barefoot CMA  May 28, 2010 5:18 PM   [facility] x 4 and will sign per protocol Vernie Murders  May 29, 2010 11:24 AM

## 2010-06-12 ENCOUNTER — Other Ambulatory Visit: Payer: Self-pay | Admitting: Internal Medicine

## 2010-06-12 ENCOUNTER — Other Ambulatory Visit: Payer: BC Managed Care – PPO

## 2010-06-12 ENCOUNTER — Ambulatory Visit (INDEPENDENT_AMBULATORY_CARE_PROVIDER_SITE_OTHER)
Admission: RE | Admit: 2010-06-12 | Discharge: 2010-06-12 | Disposition: A | Discharge status: Home / Self Care | Payer: BC Managed Care – PPO | Source: Ambulatory Visit | Attending: Internal Medicine | Admitting: Internal Medicine

## 2010-06-12 ENCOUNTER — Ambulatory Visit (INDEPENDENT_AMBULATORY_CARE_PROVIDER_SITE_OTHER): Payer: BC Managed Care – PPO | Admitting: Internal Medicine

## 2010-06-12 ENCOUNTER — Encounter: Payer: Self-pay | Admitting: Internal Medicine

## 2010-06-12 DIAGNOSIS — Z0000 Encounter for general adult medical examination without abnormal findings: Secondary | ICD-10-CM

## 2010-06-12 LAB — CBC WITH DIFFERENTIAL/PLATELET
Basophils Absolute: 0 10*3/uL (ref 0.0–0.1)
Eosinophils Absolute: 0.2 10*3/uL (ref 0.0–0.7)
HCT: 40.2 % (ref 39.0–52.0)
Hemoglobin: 13.5 g/dL (ref 13.0–17.0)
Lymphs Abs: 1.8 10*3/uL (ref 0.7–4.0)
MCHC: 33.6 g/dL (ref 30.0–36.0)
MCV: 78.4 fl (ref 78.0–100.0)
Monocytes Absolute: 0.6 10*3/uL (ref 0.1–1.0)
Neutro Abs: 5.3 10*3/uL (ref 1.4–7.7)
RDW: 15.4 % — ABNORMAL HIGH (ref 11.5–14.6)

## 2010-06-12 LAB — URINALYSIS
Ketones, ur: NEGATIVE
Specific Gravity, Urine: 1.02 (ref 1.000–1.030)
Urine Glucose: NEGATIVE
Urobilinogen, UA: 0.2 (ref 0.0–1.0)

## 2010-06-12 LAB — HEPATIC FUNCTION PANEL
AST: 25 U/L (ref 0–37)
Albumin: 3.9 g/dL (ref 3.5–5.2)

## 2010-06-12 LAB — LIPID PANEL
Cholesterol: 139 mg/dL (ref 0–200)
HDL: 39.3 mg/dL (ref >39.00)

## 2010-06-12 LAB — MICROALBUMIN / CREATININE URINE RATIO
Creatinine,U: 206.7 mg/dL
Microalb, Ur: 2.1 mg/dL — ABNORMAL HIGH (ref 0.0–1.9)

## 2010-06-12 LAB — HIGH SENSITIVITY CRP: CRP, High Sensitivity: 7.44 mg/L — ABNORMAL HIGH (ref 0.00–5.00)

## 2010-06-12 LAB — BASIC METABOLIC PANEL
CO2: 33 mEq/L — ABNORMAL HIGH (ref 19–32)
Glucose, Bld: 116 mg/dL — ABNORMAL HIGH (ref 70–99)
Potassium: 4.4 mEq/L (ref 3.5–5.1)
Sodium: 142 mEq/L (ref 135–145)

## 2010-06-12 LAB — TSH: TSH: 5.24 u[IU]/mL (ref 0.35–5.50)

## 2010-06-13 LAB — CONVERTED CEMR LAB: PSA: 0.27 ng/mL (ref <=4.00)

## 2010-06-14 ENCOUNTER — Ambulatory Visit: Payer: Self-pay | Admitting: Internal Medicine

## 2010-06-15 ENCOUNTER — Telehealth (INDEPENDENT_AMBULATORY_CARE_PROVIDER_SITE_OTHER): Payer: Self-pay | Admitting: *Deleted

## 2010-06-19 NOTE — Progress Notes (Signed)
Summary: needs all of his meds called in he had his physical 3/6  Phone Note Call from Patient Call back at Home Phone [phone]   Caller: Patient Call For: wert Summary of Call: Patient phoned stated that he came in on 3/6 for annual and he is out of his medication and was told at his physical our office would call and renew all of his prescriptions. [facility] [address] and they informed him that our office has not called or sent the prescriptions in. He can be reached at [phone]. He is completely out of his meds and he is going into his third day with no medication.  Initial call taken by: Vedia Coffer,  June 15, 2010 10:37 AM  Follow-up for Phone Call        Rxs were sent at 9:57am; pt aware and states he got his meds.Reynaldo Minium CMA  June 15, 2010 2:35 PM

## 2010-06-19 NOTE — Assessment & Plan Note (Signed)
Summary: Primary svc/ cpx   Copy to:  Jarold Motto Primary Provider/Referring Provider:  [name]  CC:  HTN.  History of Present Illness: 60 yobm  with  AO DM, HTN,and hyperlipidemia, morbid obesity & obstructive sleep apnea. 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  08/04/08 ov  cc  dizziness 5 days ago . He describes loose stools prior to this episode. Put on meclizine which makes him drowsy but helps. c/o Lt tinnitus x 4 y, neg ENT evaluation. On 3 BP meds, Orthostatic on exam with reproducible dizziness. Labs OK, cr 1.5  rec meclizine, no driving while dizzy or using meclizine.  October 04, 2008 Vassie Loll ov Sleep Medicine FU >> dizziness has not recurred, went to [place] for a 9 day trip, took CPAP, using it more than 4h/ night, Epworth Sleepiness Score 5, denies daytime drowsiness or problems driving. Nasal pillows work well, c/o water in tubing sometimes.  June 13, 2009 CPX new doe steps only,  x months otherwise no sob.  Pt denies any significant sore throat, dysphagia, itching, sneezing,  nasal congestion or excess secretions,  fever, chills, sweats, unintended wt loss, pleuritic or exertional cp, hempoptysis, change in activity tolerance  orthopnea pnd or leg swelling.  no tia or claudication.  June 28, 2009-Returns for follow up and med review. Unfortunately he did not bring meds today, we reviewed his list and update his med calendar. Gave him a new copy to bring along with his meds next ov. He has been doing well really working on diet, lost 8lbs, has had no fried foods. . Tolerating exericse in gym. Labs last visit showed good cholestrol on diet only. CRP was at  ~6. , A1C 6.7.   October 24, 2009 The patient c/o ringing in his ears for several days. He was seen in ER approx 2 weeks ago for dehydration, all symtpoms resolved.    June 12, 2010 ov cpx  Current Medications (verified): 1)  Pantoprazole Sodium 40 Mg Tbec (Pantoprazole Sodium) .... Take One Tablet By Mouth Once  Daily 2)  Diovan Hct 320-25 Mg  Tabs (Valsartan-Hydrochlorothiazide) .... Take 1 Tablet By Mouth Once A Day 3)  Minoxidil 2.5 Mg  Tabs (Minoxidil) .... 2 Tabs Once Daily 4)  Verapamil Hcl Cr 240 Mg  Tbcr (Verapamil Hcl) .... Take 1 Tablet By Mouth Once A Day 5)  Levothyroxine Sodium 50 Mcg Tabs (Levothyroxine Sodium) .... Take 1 Tablet By Mouth Once A Day 6)  Actos 30 Mg  Tabs (Pioglitazone Hcl) .... Take 1 Tablet By Mouth Once A Day 7)  Glimepiride 2 Mg  Tabs (Glimepiride) .... Take 1 Tablet By Mouth Once A Day 8)  Citrucel   Powd (Methylcellulose (Laxative)) .[name].. 1 Teaspoon  Every Morning and At Bedtime 9)  Autocpap .... Use At Bedtime 10)  Veramyst 27.5 Mcg/spray  Susp (Fluticasone Furoate) .... 2 Puffs Each Nostril Two Times A Day As Needed 11)  Tylenol 325 Mg  Tabs (Acetaminophen) .... Per Bottle 12)  Sulindac 200 Mg  Tabs (Sulindac) .... Take 1 Tablet By Mouth Two Times A Day As Needed 13)  Onetouch Ultra Test   Strp (Glucose Blood) .... Use As Directed  Allergies (verified): No Known Drug Allergies  Past History:  Past Medical History: RENAL INSUFFICIENCY (ICD-588.9)      -  Baseline  1.6 October 24, 2009 > 1.3 June 12, 2010  DIABETES MELLITUS (ICD-250.00) ANEMIA, MICROCYTIC (ICD-281.9) HYPOTHYROIDISM, BORDERLINE (ICD-244.9) HYPERTENSION (ICD-401.9)  MORBID OBESITY (ICD-278.01)   - All time high 310 2009   - Target wt  =  208  for BMI < 30   - Referred back to nutrition again June 12, 2010  Atypical CP........................................................................................................P. Ross  - LHC 08/03/01 nl coronaries and lv fn, lvedp 30  ~Cardiac w/u by Dr Tenny Craw 5/09 neg cardiolite for ischemia, rec risk reduction  DIVERTICULOSIS  colonoscopy 9/01..............................................................R  Kaplan  Severe OSA- on cpap, sleep study 09/2007,..................................................Marland KitchenDr. Vassie Loll       (HE IS COMMERCIAL BUS  DRIVER) HEALTH MAINTENANCE..................................................................................Marland KitchenWert     - Pneumovax 09/2006     - Td 04/2004     - CPX June 12, 2010   Past Pulmonary History:  Pulmonary History: PSG shows obstructive sleep apnea with AHI 12/h, increases during REM to 50/h, lowest desaturation 75% c/w severe OSA. 6/09>> set up auto CPAP 5-20 cm, humidifier & nasal pillows  7/09>>reviewed auto CPAP download 5/29 to 7/5 , poor compliance , pressure 11.4  Vital Signs:  Patient profile:   60 year old male Weight:      309 pounds O2 Sat:      98 % on Room air Temp:     97.4 degrees F oral Pulse rate:   72 / minute BP sitting:   148 / 96  (left arm) Cuff size:   large  Vitals Entered By: Vernie Murders (June 12, 2010 8:51 AM)  O2 Flow:  Room air  Physical Exam  Additional Exam:  wt  303 June 13, 2009>> 295 June 28, 2009 > 303 October 24, 2009 > 300 October 24, 2009 > 309 June 12, 2010  obese amb bm nad HEENT: nl dentition, turbinates, and orophanx. Nl external ear canals without cough reflex NECK :  without JVD/Nodes/TM/ nl carotid upstrokes bilaterally LUNGS: no acc muscle use, clear to A and P bilaterally without cough on insp or exp maneuvers CV:  RRR  no s3 or murmur or increase in P2, no edema  ABD:  soft and nontender with nl excursion in the supine position. No bruits or organomegaly, bowel sounds nl MS:  warm without deformities, calf tenderness, cyanosis or clubbing GU  testes down bilat, neg ih Rectal  mild bph,  stool g neg   Cholesterol               139 mg/dL                   6-045     ATP III Classification            Desirable:  < 200 mg/dL                    Borderline High:  200 - 239 mg/dL               High:  > = 240 mg/dL   Triglycerides             67.0 mg/dL                  4.0-981.1     Normal:  <150 mg/dL     Borderline High:  914 - 199 mg/dL   HDL                       78.29 mg/dL                 >56.21   VLDL Cholesterol  13.4 mg/dL                  1.6-10.9   LDL Cholesterol           86 mg/dL                    6-04  CHO/HDL Ratio:  CHD Risk                             4                    Men          Women     1/2 Average Risk     3.4          3.3     Average Risk          5.0          4.4     2X Average Risk          9.6          7.1     3X Average Risk          15.0          11.0                           Tests: (2) BMP (METABOL)   Sodium                    142 mEq/L                   135-145   Potassium                 4.4 mEq/L                   3.5-5.1   Chloride                  105 mEq/L                   96-112   Carbon Dioxide       [H]  33 mEq/L                    19-32   Glucose              [H]  116 mg/dL                   54-09   BUN                       19 mg/dL                    8-11   Creatinine                1.3 mg/dL                   9.1-4.7   Calcium                   10.2 mg/dL                  8.2-95.6   GFR  70.86 mL/min                >60.00  Tests: (3) CBC Platelet w/Diff (CBCD)   White Cell Count          7.9 K/uL                    4.5-10.5   Red Cell Count            5.13 Mil/uL                 4.22-5.81   Hemoglobin                13.5 g/dL                   09.8-11.9   Hematocrit                40.2 %                      39.0-52.0   MCV                       78.4 fl                     78.0-100.0   MCHC                      33.6 g/dL                   14.7-82.9   RDW                  [H]  15.4 %                      11.5-14.6   Platelet Count            215.0 K/uL                  150.0-400.0   Neutrophil %              67.5 %                      43.0-77.0   Lymphocyte %              22.3 %                      12.0-46.0   Monocyte %                7.3 %                       3.0-12.0   Eosinophils%              2.6 %                       0.0-5.0   Basophils %               0.3 %                       0.0-3.0   Neutrophill Absolute       5.3 K/uL                    1.4-7.7   Lymphocyte Absolute  1.8 K/uL                    0.7-4.0   Monocyte Absolute         0.6 K/uL                    0.1-1.0  Eosinophils, Absolute                             0.2 K/uL                    0.0-0.7   Basophils Absolute        0.0 K/uL                    0.0-0.1  Tests: (4) Hepatic/Liver Function Panel (HEPATIC)   Total Bilirubin           1.0 mg/dL                   4.5-4.0   Direct Bilirubin          0.2 mg/dL                   9.8-1.1   Alkaline Phosphatase      60 U/L                      39-117   AST                       25 U/L                      0-37   ALT                       23 U/L                      0-53   Total Protein             7.0 g/dL                    9.1-4.7   Albumin                   3.9 g/dL                    8.2-9.5  Tests: (5) TSH (TSH)   FastTSH                   5.24 uIU/mL                 0.35-5.50  Tests: (6) Full Range CRP (FCRP)   CRPH                 [H]  7.44 mg/L                   0.00-5.00     Note:  An elevated hs-CRP (>5 mg/L) should be repeated after 2 weeks to rule out recent infection or trauma.  Tests: (7) Microalbumin/Creatinine Ratio (MALB)   Microalbumin         [H]  2.1 mg/dL                   6.2-1.3   Urine Creainine  206.7 mg/dL   Microalbumin Ratio        1.0 mg/g                    0.0-30.0  Tests: (8) UDip Only (UDIP)   Color                     LT. YELLOW       RANGE:  Yellow;Lt. Yellow   Clarity                   CLEAR                       Clear   Specific Gravity          1.020                       1.000 - 1.030   Urine Ph                  7.0                         5.0-8.0   Protein                   NEGATIVE                    Negative   Urine Glucose             NEGATIVE                    Negative   Ketones                   NEGATIVE                    Negative   Urine Bilirubin           NEGATIVE                    Negative   Blood                      NEGATIVE                    Negative   Urobilinogen              0.2                         0.0 - 1.0   Leukocyte Esterace        NEGATIVE                    Negative   Nitrite                   NEGATIVE                    Negative  Tests: (9) Hemoglobin A1C (A1C)   Hemoglobin A1C       [H]  7.2 %                       4.6-6.5  Impression & Recommendations:  Problem # 1:  DIABETES MELLITUS (ICD-250.00)  His updated medication list for this problem includes:    Actos 30 Mg Tabs (Pioglitazone hcl) .Marland KitchenMarland KitchenMarland KitchenMarland Kitchen  Take 1 tablet by mouth once a day    Glimepiride 2 Mg Tabs (Glimepiride) .[name]... Take 1 tablet by mouth once a day  Labs Reviewed: Creat: 1.6 (10/24/2009)    Reviewed HgBA1c results: 6.0 (10/24/2009) > 7.2 June 12, 2010 so add metformin next  6.7 (06/13/2009)  Problem # 2:  HYPERCHOLESTEROLEMIA (ICD-272.0)  Labs Reviewed: SGOT: 34 (06/13/2009)   SGPT: 32 (06/13/2009)   HDL:46.30 (06/13/2009), 37.3 (11/05/2007)  LDL:94 (06/13/2009), 96 (11/05/2007) > 86 June 12, 2010    Chol:151 (06/13/2009), 144 (11/05/2007)  Trig:52.0 (06/13/2009), 56 (11/05/2007)  Problem # 3:  RENAL INSUFFICIENCY (ICD-588.9) much better on arb  Problem # 4:  HYPOTHYROIDISM, BORDERLINE (ICD-244.9)  His updated medication list for this problem includes:    Levothyroxine Sodium 50 Mcg Tabs (Levothyroxine sodium) .[name]... Take 1 tablet by mouth once a day  Labs Reviewed: TSH: 3.79 (10/24/2009)   > 5.2 in 100 so needs 125 next refill HgBA1c: 6.0 (10/24/2009) Chol: 151 (06/13/2009)   HDL: 46.30 (06/13/2009)   LDL: 94 (06/13/2009)   TG: 52.0 (06/13/2009)  Problem # 5:  MORBID OBESITY (ICD-278.01)  Orders: Nutrition Referral (Nutrition)  Problem # 6:  HYPERTENSION (ICD-401.9)  His updated medication list for this problem includes:    Diovan Hct 320-25 Mg Tabs (Valsartan-hydrochlorothiazide) .[name]... Take 1 tablet by mouth once a day    Minoxidil 2.5 Mg Tabs (Minoxidil) .[name]... 2 tabs once daily    Verapamil Hcl  Cr 240 Mg Tbcr (Verapamil hcl) .[name]... Take 1 tablet by mouth once a day  not optimal increase minoxidil next  Other Orders: Est. Patient 40-64 years ([address]) T-2 View CXR (71020TC) TLB-Lipid Panel (80061-LIPID) TLB-BMP (Basic Metabolic Panel-BMET) (80048-METABOL) TLB-CBC Platelet - w/Differential (85025-CBCD) TLB-Hepatic/Liver Function Pnl (80076-HEPATIC) TLB-TSH (Thyroid Stimulating Hormone) (84443-TSH) TLB-CRP-High Sensitivity (C-Reactive Protein) (86140-FCRP) TLB-Microalbumin/Creat Ratio, Urine (82043-MALB) TLB-Udip ONLY (81003-UDIP) TLB-A1C / Hgb A1C (Glycohemoglobin) (83036-A1C) T-PSA Total ([address]) Misc. Referral (Misc. Ref)  Patient Instructions: 1)  See Patient Care Coordinator before leaving for  nutrition eval 2)  Weight control is simply a matter of calorie balance which needs to be tilted in your favor by eating less and exercising more.  To get the most out of exercise, you need to be continuously aware that you are short of breath, but never out of breath, for 30 minutes daily. As you improve, it will actually be easier for you to do the same amount in  30 minutes so always push to the level where you are short of breath. 3)  See Tammy NP w/in 2 weeks with all your medications, even over the counter meds, separated in two separate bags, the ones you take no matter what vs the ones you stop once you feel better and take only as needed.  She will generate for you a new user friendly medication calendar that will put [place] all on the same page re: your medication use.  She will set you up to see me in 3 months 4)  late add increase minoxidil and synthoid next ov

## 2010-06-24 LAB — POCT CARDIAC MARKERS
CKMB, poc: 2.1 ng/mL (ref 1.0–8.0)
CKMB, poc: 2.2 ng/mL (ref 1.0–8.0)
Myoglobin, poc: 137 ng/mL (ref 12–200)
Myoglobin, poc: 200 ng/mL (ref 12–200)
Troponin i, poc: <0.05 ng/mL (ref 0.00–0.09)
Troponin i, poc: <0.05 ng/mL (ref 0.00–0.09)

## 2010-06-24 LAB — DIFFERENTIAL
Basophils Absolute: 0 10*3/uL (ref 0.0–0.1)
Basophils Relative: 0 % (ref 0–1)
Neutro Abs: 7.6 10*3/uL (ref 1.7–7.7)
Neutrophils Relative %: 80 % — ABNORMAL HIGH (ref 43–77)

## 2010-06-24 LAB — BASIC METABOLIC PANEL
BUN: 14 mg/dL (ref 6–23)
Calcium: 9.7 mg/dL (ref 8.4–10.5)
GFR calc non Af Amer: 47 mL/min — ABNORMAL LOW (ref >60)
Glucose, Bld: 139 mg/dL — ABNORMAL HIGH (ref 70–99)
Sodium: 140 mEq/L (ref 135–145)

## 2010-06-24 LAB — CBC
Hemoglobin: 13 g/dL (ref 13.0–17.0)
MCHC: 33.6 g/dL (ref 30.0–36.0)
Platelets: 194 10*3/uL (ref 150–400)
RDW: 15.6 % — ABNORMAL HIGH (ref 11.5–15.5)

## 2010-06-24 LAB — GLUCOSE, CAPILLARY: Glucose-Capillary: 196 mg/dL — ABNORMAL HIGH (ref 70–99)

## 2010-07-02 ENCOUNTER — Encounter: Payer: BC Managed Care – PPO | Attending: Internal Medicine | Admitting: *Deleted

## 2010-07-02 DIAGNOSIS — E119 Type 2 diabetes mellitus without complications: Secondary | ICD-10-CM | POA: Insufficient documentation

## 2010-07-02 DIAGNOSIS — Z713 Dietary counseling and surveillance: Secondary | ICD-10-CM | POA: Insufficient documentation

## 2010-07-02 DIAGNOSIS — E669 Obesity, unspecified: Secondary | ICD-10-CM | POA: Insufficient documentation

## 2010-07-10 ENCOUNTER — Encounter: Payer: Self-pay | Admitting: Adult Health

## 2010-07-12 ENCOUNTER — Encounter: Payer: BC Managed Care – PPO | Admitting: Adult Health

## 2010-07-20 ENCOUNTER — Encounter: Payer: Self-pay | Admitting: Adult Health

## 2010-07-23 ENCOUNTER — Ambulatory Visit (INDEPENDENT_AMBULATORY_CARE_PROVIDER_SITE_OTHER): Payer: BC Managed Care – PPO | Admitting: Adult Health

## 2010-07-23 ENCOUNTER — Encounter: Payer: BC Managed Care – PPO | Admitting: Adult Health

## 2010-07-23 ENCOUNTER — Encounter: Payer: Self-pay | Admitting: Adult Health

## 2010-07-23 DIAGNOSIS — K5790 Diverticulosis of intestine, part unspecified, without perforation or abscess without bleeding: Secondary | ICD-10-CM | POA: Insufficient documentation

## 2010-07-23 DIAGNOSIS — E119 Type 2 diabetes mellitus without complications: Secondary | ICD-10-CM

## 2010-07-23 DIAGNOSIS — R0789 Other chest pain: Secondary | ICD-10-CM

## 2010-07-23 DIAGNOSIS — N259 Disorder resulting from impaired renal tubular function, unspecified: Secondary | ICD-10-CM

## 2010-07-23 DIAGNOSIS — G4733 Obstructive sleep apnea (adult) (pediatric): Secondary | ICD-10-CM

## 2010-07-23 DIAGNOSIS — K5730 Diverticulosis of large intestine without perforation or abscess without bleeding: Secondary | ICD-10-CM

## 2010-07-23 DIAGNOSIS — I10 Essential (primary) hypertension: Secondary | ICD-10-CM

## 2010-07-23 DIAGNOSIS — E039 Hypothyroidism, unspecified: Secondary | ICD-10-CM

## 2010-07-23 MED ORDER — SITAGLIPTIN PHOSPHATE 50 MG PO TABS: 50.0000 mg | ORAL_TABLET | Freq: Every day | ORAL | Status: DC

## 2010-07-23 MED ORDER — LEVOTHYROXINE SODIUM 75 MCG PO TABS: 75.0000 ug | ORAL_TABLET | Freq: Every day | ORAL | Status: DC

## 2010-07-23 MED ORDER — MINOXIDIL 2.5 MG PO TABS: 5.0000 mg | ORAL_TABLET | Freq: Every day | ORAL | Status: DC

## 2010-07-23 NOTE — Assessment & Plan Note (Signed)
OSA on nocturnal CPAP Set up follow up with Dr. Vassie Loll  (last seen 09/2008)

## 2010-07-23 NOTE — Assessment & Plan Note (Signed)
Increase Levothyroxine daily   follow up in 6 weeks

## 2010-07-23 NOTE — Patient Instructions (Addendum)
Increase Minoxidil 5mg  daily  - you need to take 2 tabs- each tab is 2.5mg  each .  Increase Levothyroxine daily  Stop Actos  Begin Januvia 50mg  daily  Check blood sugars daily , keep log and bring to each visit.  follow up in 6 weeks  Follow Med calendar closely and bring to each visit.

## 2010-07-23 NOTE — Progress Notes (Signed)
Subjective:    Patient ID: Matthew Keller, male    DOB: 23-Feb-1951, 60 y.o.   MRN: [id number]  HPI 89 yobm with AO DM, HTN,and hyperlipidemia, morbid obesity & obstructive sleep apnea. 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   08/04/08 ov  cc dizziness 5 days ago . He describes loose stools prior to this episode. Put on meclizine which makes him drowsy but helps. c/o Lt tinnitus x 4 y, neg ENT evaluation.  On 3 BP meds, Orthostatic on exam with reproducible dizziness.  Labs OK, cr 1.5 rec meclizine, no driving while dizzy or using meclizine.   October 04, 2008 Vassie Loll ov  Sleep Medicine FU >> dizziness has not recurred, went to [place] for a 9 day trip, took CPAP, using it more than 4h/ night, Epworth Sleepiness Score 5, denies daytime drowsiness or problems driving. Nasal pillows work well, c/o water in tubing sometimes.   June 13, 2009 CPX new doe steps only, x months otherwise no sob.>>no changes, labs   June 28, 2009-Returns for follow up and med review. Unfortunately he did not bring meds today, we reviewed his list and update his med calendar. Gave him a new copy to bring along with his meds next ov. He has been doing well really working on diet, lost 8lbs, has had no fried foods. . Tolerating exericse in gym. Labs last visit showed good cholestrol on diet only. CRP was at ~6. , A1C 6.7.    June 12, 2010 ov cpx>>labs   07/23/2010 Follow up and med review Pt returns for follow up and med review  Returns for follow up and med review. Unfortunately he did not bring meds today, we reviewed his list and update his med calendar. Gave him a new copy to bring along with his meds next ov. He has been doing well really working on diet, has seen nutritionist and increased intensity at gym but  Has only lost 4 lbs.  . Labs last visit showed good cholestrol on diet only. CRP was at  ~7 . A1C increased 6.7. -7.2.  [name] We discussed his DM meds and control. We have not used metformin due to  renal insufficiency. He does get fluid retention on Actos.  Labs showed TSH borderline on Levothyroxine daily. He says he is very compliant with his CPAP. No daytime hypersolomenence.  Last seen by Dr. Vassie Loll in 09/2008.  We reviewed his med list and updated his list. It appears he is only taking Minoxidil 2.5mg  daily . B/p has been borderline , emphasized that he needs to be on 5mg  daily .         Review of Systems Constitutional:   No  weight loss, night sweats,  Fevers, chills  HEENT:   No headaches,  Difficulty swallowing,  Tooth/dental problems, or  Sore throat,                No sneezing, itching, ear ache, nasal congestion, post nasal drip,   CV:  No chest pain,  Orthopnea, PND, swelling in lower extremities, anasarca, dizziness, palpitations, syncope.   GI  No heartburn, indigestion, abdominal pain, nausea, vomiting, diarrhea, change in bowel habits, loss of appetite, bloody stools.   Resp: No shortness of breath with exertion or at rest.  No excess mucus, no productive cough,  No non-productive cough,  No coughing up of blood.  No change in color of mucus.  No wheezing.  No chest wall deformity  Skin: no rash or lesions.  GU: no dysuria, change in color of urine, no urgency or frequency.  No flank pain, no hematuria   MS:  No joint swelling.   No decreased range of motion.  No back pain.  Psych:  No change in mood or affect. No depression or anxiety.  No memory loss.   Objective:   Physical Exam GEN: A/Ox3; pleasant , NAD, well nourished . Obese male   HEENT:  [place]/AT,  EACs-clear, TMs-wnl, NOSE-clear, THROAT-clear, no lesions, no postnasal drip or exudate noted.   NECK:  Supple w/ fair ROM; no JVD; normal carotid impulses w/o bruits; no thyromegaly or nodules palpated; no lymphadenopathy.  RESP  Clear  P & A; w/o, wheezes/ rales/ or rhonchi.no accessory muscle use, no dullness to percussion  CARD:  RRR, no m/r/g  , no peripheral edema, pulses intact, no cyanosis or  clubbing.  GI:   Soft & nt; nml bowel sounds; no organomegaly or masses detected.  Musco: Warm bil, no deformities or joint swelling noted.   Neuro: alert, no focal deficits noted.    Skin: Warm, no lesions or rashes           Assessment & Plan:

## 2010-07-23 NOTE — Assessment & Plan Note (Signed)
Not at goal Plan:  Increase Minoxidil 5mg  daily  - you need to take 2 tabs- each tab is 2.5mg  each .  follow up 6 weeks

## 2010-07-23 NOTE — Assessment & Plan Note (Signed)
Not at goal  Plan:  Stop Actos  Begin Januvia 50mg  daily  Check blood sugars daily , keep log and bring to each visit.  follow up in 6 weeks  Follow Med calendar closely and bring to each visit.

## 2010-07-25 ENCOUNTER — Other Ambulatory Visit: Payer: Self-pay | Admitting: *Deleted

## 2010-08-21 NOTE — Letter (Signed)
September 09, 2006    Matthew Keller  [address]  [place], [place] [address]   RE:  [name], [name]  MRN:  [id number]  /  DOB:  Jun 24, 1950   [name]:   My records indicate that you were scheduled for a comprehensive  healthcare evaluation today, September 09, 2006.  We reserve 30-minute slots  for patients for this purpose and this space was therefore not available  for any other patients but you.   This may be a scheduling error on our part, and if so please forgive me  for writing this note.   However, if you desire to continue with me as your primary physician, I  would like you to schedule a comprehensive healthcare evaluation at your  convenience within the next 3 months, and be sure to call us if there is  a conflict on your part and you cannot keep the scheduled appointment so  we can make this available to another patient.    Sincerely,      Casimiro Needle B. [name], MD, [facility]  Electronically Signed    MBW/MedQ  DD: 09/09/2006  DT: 09/09/2006  Job #: [id number]

## 2010-08-21 NOTE — Assessment & Plan Note (Signed)
[facility]                             PULMONARY OFFICE NOTE   Matthew Keller, Matthew Keller                        MRN:          [id number]  DATE:09/16/2006                            DOB:          Matthew Keller    PRIMARY SERVICE COMPREHENSIVE HEALTHCARE EVALUATION   HISTORY:  A 60 year old black male, remote smoker, with morbid obesity  complicated by hypertension and diabetes. He admits that he has been  diabetic since 2003. He admits that he has not been consistent about  diet or exercise. He has already seen nutrition and says he just does  not have the discipline to follow through on the recommendations.  However, he denies any exertional chest pain, dyspnea, orthopnea, PND or  leg swelling, TIA or claudication symptoms or daytime hypersomnolence  related to obesity.   PAST MEDICAL HISTORY:  1. Hypothyroidism.  2. Hypertension.  3. Chronic renal insufficiency with creatinine between 1.5 and 1.9.   ALLERGIES:  None known.   MEDICATIONS:  Taken in detail on the work sheet column dated September 16, 2006.   SOCIAL HISTORY:  He drinks an occasional beer. He quit smoking 21 years  ago. 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   FAMILY HISTORY:  Is positive for throat cancer in his brother who  smoked. One brother had alcoholism. Father died of alcohol related  disease. Mother has diabetes. No premature heart disease or other  cancers in his family to his knowledge.   REVIEW OF SYSTEMS:  Taken in detail on the worksheet and negative except  as outlined above.   PHYSICAL EXAMINATION:  This is an ambulatory, obese, black male who has  gained another pound over the last year to 287 now, blood pressure  128/80.  HEENT: Ocular examination that was done was limited. Funduscopy  revealing no obvious arterial change.  NECK: Supple without cervical adenopathy or tenderness. Carotid  upstrokes were brisk without any bruits.  Dentition was  intact. Oropharynx was clear. Ear canals were clear  bilaterally.  CHEST: Completely clear bilaterally to auscultation and percussion.  HEART: Regular rate and rhythm without murmur, gallop or rub. No  displacement of PMI.  ABDOMEN: Obese, but otherwise benign with no palpable organomegaly, mass  or tenderness.  Femoral pulses were present bilaterally.  GENITOURINARY: Testes descended bilaterally with no nodules or evidence  of inguinal hernia.  RECTAL: Revealed mild BPH. Smooth texture. Stool guaiac was negative.  EXTREMITIES: Warm without calf tenderness, cyanosis, clubbing or edema.   LABORATORY DATA:  CRP was 4.0, HDL cholesterol was 35, LDL was 100.  Hemoglobin A1c was 6.6, TSH was 4.6. CBC was normal. Creatinine is 1.6.  Hematocrit was 38 with an MCV of 76.   IMPRESSION:  1. Hypertension, adequately controlled on present regimen.  2. Mild renal insufficiency, probably secondary to hypertensive      nephrosclerosis with no change from baseline.  3. Hypothyroidism, adequately treated with Synthroid.  4. Morbid obesity as yet to be addressed by this patient in a      consistent fashion. I discussed  calorie balance issues with him      today. He has the phone number to call for a nutritionist and has      failed to follow through, which he promises to do as he has in      the past.  5. Diabetes secondary to obesity. Hemoglobin A1c is 6.6 compared to      6.7 on previous visit and I did not change recommendations.  6. Possible iron deficiency anemia. He underwent colonoscopy in 2001      and was found to have diverticulosis only. When he returns, we need      to repeat a CBC with iron levels and also do stools for occult      blood.   Note, that a PSA was not done today and he is [place] American with  otherwise negative risk factors. Will discuss screening with PSAs on his  next visit in terms of risks and benefits. Next visit in three months.     [name]. [name], MD, [facility]   Electronically Signed    MBW/MedQ  DD: 09/16/2006  DT: 09/16/2006  Job #: [id number]

## 2010-08-21 NOTE — Procedures (Signed)
Matthew Keller, Matthew Keller                 ACCOUNT NO.:  [id number]   MEDICAL RECORD NO.:  [id number]          PATIENT TYPE:  OUT   LOCATION:  SLEEP CENTER                 FACILITY:  [facility]   PHYSICIAN:  Oretha Milch, MD      DATE OF BIRTH:  Jan 15, 1951   DATE OF STUDY:  07/22/2007                            NOCTURNAL POLYSOMNOGRAM   REFERRING PHYSICIAN:  [name]. [name], MD, FCCP   INDICATION FOR STUDY:  Mr. Miera is a 60 year old gentleman with  hypertension, loud snoring and obesity and drives a tour bus.  His  weight is 298 pounds with a height of 5 feet 11 inches and a BMI of 42,  neck size 17.5 inches.   EPWORTH SLEEPINESS SCORE:  Four.   MEDICATIONS:  Verapamil, Diovan, HCTZ, Minoxidil, Glimepiride, Actos,  Levothyroxine, Sulindac.   This overnight polysomnogram was performed with a sleep technologist in  attendance.  EEG, EOG, EMG, EKG and respiratory parameters were recorded  according to criteria laid out by the American Academy of Sleep  Medicine.   SLEEP ARCHITECTURE:  Lights out was at 22:51 p.m.  Total sleep time was  275 minutes with a sleep period of time of 388.5 minutes.  Sleep latency  was 9 minutes and latency to REM sleep was 230.5 minutes.  Wake after  sleep onset was 114 minutes with a sleep efficiency of 69.1%.   SLEEP STAGES:  As a percentage of total sleep time was N1 13.5%, N2  70.9%, N3 0%, REM 15.6% (43 minutes).  The longest period of REM sleep  was noted around 04:30 a.m. He spent 18 minutes in the supine position.   AROUSAL DATA:  There were a total of 41 arousals of which 30 were  spontaneous and 11 were associated with respiratory movements leading to  an arousal index of 8.9 events per hour.   RESPIRATORY DATA:  There were a total of 27 obstructive apneas, 4  central apneas.  During 43 minutes of REM sleep the REM related AHI was  50.2 events per hour.  The longest apnea duration was 28 seconds and  longest hypopnea duration was 44.9 seconds.   OXYGEN DATA:  The desaturation index was 28.4 events per hour.  The  lowest desaturation was 75% during non-REM sleep.  He spent 6.4 minutes  with a saturation less than 88%.   LIMB MOVEMENT DATA:  No significant limb movements were observed.   CARDIAC DATA:  The lowest heart rate was 49 beats per minute during REM  sleep and the highest heart rate was 104 beats per minute during non-REM  sleep.  Occasional isolated PVC's were observed.   DISCUSSION:  Although the study was ordered as split protocol there was  insufficient sleep and criteria were not met to initiate CPAP.  Predominant REM-related events.   MOVEMENT-PARASOMNIA:  No evidence of behavioural disorder   IMPRESSIONS:  1. Mild to moderate degree of obstructive sleep apnea with hypopneas      causing sleep fragmentation and severe oxygen desaturation.  2. No slow wave sleep was seen.  REM onset was delayed.  Supine REM  sleep was not seen.  3. No evidence of cardiac arrhythmias or periodic limb movements.   RECOMMENDATIONS:  1. I would treat this degree of sleep disturbance because he does seem      to have uncontrolled hypertension and is a Airline pilot.  2. The treatment options include CPAP and possibly oral appliances.  3. I would consider a formal CPAP titration study for optimal      titration.  4. He should be advised not to drive when sleepy and to avoid      medications with sedative side effects.      Oretha Milch, MD  Electronically Signed     RVA/MEDQ  D:  07/28/2007 14:50:39  T:  07/28/2007 15:05:49  Job:  [id number]

## 2010-08-21 NOTE — Assessment & Plan Note (Signed)
Matthew Keller                             PULMONARY OFFICE NOTE   Matthew Keller, Matthew Keller                        MRN:          [id number]  DATE:12/11/2006                            DOB:          [birth year]    PRIMARY SERVICE FOLLOWUP OFFICE VISIT   HISTORY:  A 60 year old black male with morbid obesity unable to get his  weight down, and frequently forgetting to take his medications, or  running out of them and not getting them refilled.  He returns today up  another 2 pounds from previous visit at 301, stating that he is  exercising a minimum of 45 minutes 4 times weekly, but not making any  headway in terms of weight loss.   PHYSICAL EXAMINATION:  He is a morbidly obese ambulatory black male in  no acute distress.  VITAL SIGNS:  Stable vital signs.  HEENT:  Unremarkable.  Oropharynx is clear.  LUNGS:  The lung fields are completely clear bilaterally to auscultation  and percussion.  CARDIAC:  Regular rate and rhythm without murmur, gallop, or rub.  ABDOMEN:  Soft and benign.  EXTREMITIES:  Warm without calf tenderness, cyanosis, clubbing, or  edema.   I reviewed his lab work with him from September 16, 2006 indicating a  hemoglobin A1c of 6.6 with an LDL cholesterol then of 100 and an HDL of  35.   IMPRESSION:  1. Hypertension is actually adequately controlled on his present      regimen, but note his records reflect the fact that he is not      consistently taking his medications.  I used this opportunity,      therefore, to emphasize to him that, whereas he can use sulindac      p.r.n. back pain, he cannot do the same with his other medications      and that is why we separate the maintenance versus p.r.n. and      provide him a written inventory of these medications that we have      asked him to keep up with and to bring to each office visit, which      so far, he has failed to do, and I emphasized that, to obtain      optimal medical care, he  needs to keep up with his medicines more      consistently.  2. Borderline hypothyroidism has been suggested by the slightly      elevated TSH on record (although technically still within normal      limits) as well as reported excellent aerobic exercise habits, but      no improvement in weight/calorie balance.  I have, therefore,      recommended increasing Synthroid up to 50 mcg daily.  3. Chart review also indicates a microcytic anemia.  Will check an      iron and TIBC profile.  If indeed he does meet the criteria for      iron deficiency, will consider referring him to Dr. Melvia Heaps,      his GI physician of record for GI  workup.  In the meantime, the      only medication that      could put him at risk of GI bleeding is sulindac, which I have      emphasized should only be      taken on a p.r.n. basis, as per the calendar he has at home.      Followup will be every 3 months, sooner if needed.     [name]. [name], MD, [facility]  Electronically Signed    MBW/MedQ  DD: 12/15/2006  DT: 12/16/2006  Job #: [id number]

## 2010-08-21 NOTE — Assessment & Plan Note (Signed)
[facility]                             PULMONARY OFFICE NOTE   ARJUN, HARD                        MRN:          [id number]  DATE:10/14/2006                            DOB:          [birth year]    HISTORY OF PRESENT ILLNESS:  The patient is a 60 year old African-  American male patient of Dr Thurston Hole, who has a known history of morbid  obesity, complicated by hypertension and diabetes, who presents today  for blood pressure followup. The patient reports that he was recently on  vacation and ran out of his blood pressure medications for 3 days. 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  The patient reports that he restarted his medications and systolic blood  pressure has been back down to 130 to 140. The patient denies any chest  pain, palpitations, leg swelling, orthopnea, or PND.   PAST MEDICAL HISTORY:  Reviewed.   CURRENT MEDICATIONS:  Reviewed.   PHYSICAL EXAMINATION:  GENERAL:  A pleasant, obese male in no acute  distress.  VITAL SIGNS:  Afebrile with stable vital signs. O2 saturation is 97% on  room air. Blood pressure recheck is 130/80 bilaterally.  HEENT:  Unremarkable.  NECK:  Supple without adenopathy. No JVD.  LUNGS:  Sounds are clear.  CARDIAC:  Regular rate.  ABDOMEN:  Morbidly obese and soft.  EXTREMITIES:  Warm without any edema.   IMPRESSION:  Hypertension with recently elevated blood pressure due to  discontinuation of medications.   PLAN:  The patient has been recommended to maintain medications on a  daily basis. He is also recommended on dietary issues and weight loss,  plus exercise. The patient's blood pressure is well controlled on  today's visit.   FOLLOWUP:  The patient will follow back up with Dr. [name] in 4 to 6 weeks  as scheduled, or sooner if needed.      Rubye Oaks, NP  Electronically  Signed      [name]. [name], MD, [facility]  Electronically Signed   TP/MedQ  DD: 10/17/2006  DT: 10/18/2006  Job #: [id number]

## 2010-08-21 NOTE — Assessment & Plan Note (Signed)
Matthew Keller                            CARDIOLOGY OFFICE NOTE   Matthew Keller, Matthew Keller                        MRN:          [id number]  DATE:08/10/2007                            DOB:          Feb 19, 1951    IDENTIFICATION:  Matthew Keller is a 60 year old gentleman who was referred  for evaluation of chest pressure   HISTORY OF PRESENT ILLNESS:  The patient has a history of chest pain in  the past. Cardiac catheterization in 2003 showed no disease.   He said recently he has had some spells when he is walking up the steps  over the past year where he will get a heaviness in his chest with  shortness of breath.  He is on the treadmill, though, walking at a level  of 28 (question speed).  He notes no problems with this.  He would like  to enter into a rigorous physical regimen to try to get his weight down  and improve his health, and he was sent for further evaluation.   He denies any episodes that wake him from sleep.   ALLERGIES:  None.   CURRENT MEDICINES:  1. Omeprazole 20.  2. Diovan/hydrochlorothiazide 320/25.  3. Minoxidil 2.5 two tablets once a day.  4. Verapamil HR 240.  5. Synthroid.  6. Actos 30.  7. Glyburide 2.  8. Sulindac 200.   PAST MEDICAL HISTORY:  1. Diabetes on oral agents.  2. Hypothyroidism.  3. Hypertension.  4. Morbid obesity.  5. History of reflux.   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  Does not smoke, does  not drink.   FAMILY HISTORY:  Mother is alive at 10.  Father died at 62 of liver  problems. Two sisters, three brothers, one deceased of throat cancer.  No history of premature CAD.   REVIEW OF SYSTEMS:  All systems reviewed and negative to the above  problem except as noted above.   PHYSICAL EXAMINATION:  GENERAL:  On exam, the patient is in no distress.  VITAL SIGNS:  Blood pressure is 135/86. Pulse is 94, weight 298.  HEENT:   Normocephalic, atraumatic. EOMI. PERRLA.  Mucous membranes are  moist.  NECK:  JVP is normal without bruits.  LUNGS:  Clear to auscultation.  No rales or wheezes.  CARDIAC:  Regular rate and rhythm, S1-S2. No S3, no murmurs.  ABDOMEN:  Supple, obese.  No hepatomegaly.  EXTREMITIES:  Good distal pulses.  No edema.   IMPRESSION:  1. Chest pressure. I am not convinced this represents significant      angina.  Still he has risk factors.  I would go ahead and set up      for a Myoview, particularly since he drives a bus carrying multiple      people and before he enters into a workout program.  2. Health care maintenance.  We will check fasting lipid panel when he      comes in for the stress test.  3. Hypertension, adequate control.  4. Diabetes on oral agents.  5. Sleep apnea.  The patient is due for followup in pulmonary clinic.   I have not set a definite followup but will be in touch with him with  the lab and test results.     Matthew Riffle, MD, [facility]  Electronically Signed    PVR/MedQ  DD: 08/10/2007  DT: 08/10/2007  Job #: [id number]   cc:   [name]. [name], MD, FCCP

## 2010-08-24 NOTE — Assessment & Plan Note (Signed)
[facility]                               PULMONARY OFFICE NOTE   [name], Matthew Keller                        MRN:          [id number]  DATE:10/28/2005                            DOB:          02/09/1951    This is a primary service/extended followup office visit of this 60 year old  black male with morbid obesity complicated by diabetes, hypertension, and  hyperlipidemia.  He has not been able to tolerate Vytorin because it makes  him tired.  Stopped it at least 2 weeks ago, did not call to report this,  but is on all the other medicines as they are listed in the column dated  October 28, 2005, and correlate with his medication counter 100%.   He is getting regular aerobic exercise now three to four times weekly and  has been to nutrition with improvement in his weight down 5pounds, generally  feeling quite good.   He denies any exertional chest pain, orthopnea, PND, or leg swelling.   The patient reports fasting blood sugars typically between 80 and 120 with  resolution of previous polyuria/urgency and no difficulty with force of  stream.   PHYSICAL EXAMINATION:  GENERAL:  He is a pleasant ambulatory black male in  no acute distress.  VITAL SIGNS:  Blood pressure 130/80 after taking his medicines this morning.  HEENT:  Unremarkable.  NECK:  Clear.  LUNG FIELDS:  Completely clear bilaterally to auscultation and percussion.  HEART:  Regular rhythm without murmur, gallop, rub.  ABDOMEN:  Soft, benign, moderately obese, benign.  EXTREMITIES:  Warm without calf tenderness, cyanosis, clubbing, edema.   IMPRESSION:  1.  Hypertension at goal on present complex regimen which I reviewed with      the patient in detail with no changes recommended.  2.  Hypothyroidism.  Will need to be sure his TSH is normalized on the      Synthroid dose to help him improve problem #3.  3.  Morbid obesity complicated by diabetes and hyperlipidemia.  Hemoglobin      A1c  is pending as well as nonfasting BMET.  In the future I have asked      him to try to make sure that the morning appointment is fasting and      asked him to hold his oral hypoglycemics in the morning prior to coming      in for a visit so we can check a true lipid profile.  4.  Apparently intolerance to Vytorin with nonspecific fatigue that improved      off of Vytorin.  I do not believe that this necessarily proves that he      cannot take this category of medication and would ask him to keep an      open mind in the event that he is not able to manage his lipids through      diet and exercise.   Overall, however, I am impressed that he is beginning to turn things around  and reinforced as much as possible the improvement in both calorie intake  and calorie  output, with followup by nutrition with a food diary if at all  possible between now and his next visit in 3 months.                                   [name]. [name], MD, [facility]   MBW/MedQ  DD:  10/28/2005  DT:  10/28/2005  Job #:  [id number]

## 2010-08-24 NOTE — Assessment & Plan Note (Signed)
[facility]                             PULMONARY OFFICE NOTE   ELSIE, SAKUMA                        MRN:          [id number]  DATE:07/09/2006                            DOB:          [birth year]    HISTORY:  This is a 60 year old black male with morbid obesity  complicated by hypertension and diabetes, complaining of dyspnea mostly  after he eats.  He denies any overt reflux symptoms, fevers, chills,  sweats, chest pain.  States this problem has gradually worsened over the  last year with associated weight gain.   His main concern, however, today is acute back pain that occurred after  falling on his buttocks, and was seen in the emergency room with x-rays  and was treated with hydrocodone.  The pain radiated down his left knee  all the way to his foot at one point, but now is somewhat improved.  He  denies any pain with coughing, fevers, chills, sweats, change of bowel  or bladder habits.   MEDICATIONS:  Medications reviewed in calendar format in the column  dated April 2008, correct as listed.   He denies any over symptoms of polydipsia, polyuria, polyphagia.   PHYSICAL EXAMINATION:  He is a robust, ambulatory black male who has  gained another 10 pounds, up to 295 now.  HEENT:  Unremarkable.  Oropharynx is clear.  NECK:  Supple without cervical adenopathy or tenderness.  Trachea is  midline.  No thyromegaly.  Lung fields are completely clear bilaterally to auscultation and  percussion.  Heart is a regular rhythm without murmurs, rubs, or gallops.  ABDOMEN:  Soft, benign.  EXTREMITIES:  Warm without any calf tenderness, cyanosis, clubbing, or  edema.  He had minimal straight leg raising testing on the left with no  true radicular symptoms observed.  He had normal reflexes and normal  heel to toe walking along with sensory exam.   BMET today is normal except for a creatinine of 1.5, no change in  baseline, fasting blood sugar 97, and  hemoglobin A1c was 6.7.   IMPRESSION:  1. Dyspnea after eating is probably related to obesity and reflux.  I      reviewed with him a reflux diet, and emphasized to continue to take      the omeprazole before meals.  However, a main concern I have is      that the patient has not been consistent about diet and exercise,      and I have referred him back to his nutritionist with a food diary,      if he will keep the appointment.  2. Diabetes mellitus.  Hemoglobin A1c is at 6.7 with a fasting blood      sugar of 97 which is a reasonable goal.  However, again, I am      concerned about the weight issue as noted above.  3. Traumatic low back pain radiating to his left hip consistent with      possible mild radiculopathy; however, note that this is improving.      He has already  been x-rayed at [facility] so I am going to recommend      Clinoril 200 mg b.i.d. p.r.n. with followup by neurosurgery if he      develops      more radicular features, or orthopedics if the problem is more      localized to the hip and back itself.   Chart review indicates he is due for followup June of 2008.     [name]. [name], MD, [facility]  Electronically Signed    MBW/MedQ  DD: 07/10/2006  DT: 07/10/2006  Job #: [id number]

## 2010-08-24 NOTE — H&P (Signed)
[place]  Patient:    Matthew Keller, Matthew Keller Visit Number: [id number] MRN: [id number]          Service Type: MED Location: [phone] Attending Physician:  Avie Echevaria Dictated by:   [name]. [name], M.D. [facility] Admit Date:  08/03/2001                           History and Physical  CHIEF COMPLAINT:  Chest pain.  HISTORY OF PRESENT ILLNESS:  This is a 60 year old black male with morbid obesity complicated by hypertension and borderline diabetes.  Reporting new onset chest pain today, "not quite like anything he has had before" that occurred initially while he was out for a walk when he started going up a steep hill.  He had actually been walking 10 minutes before he started to walk up the hill and then began noticing discomfort in his left chest, slightly left to the sternum.  The pain resolved after a few minutes of resting, but it occurred off and on during the rest of the day, kind of typically when he is "stressed," for instance when he practicing to give a speech this morning.  It was not associated with any diaphoresis, nausea, vomiting, radiation of pain, or significant dyspnea over baseline.  He came to my office with this history and because of all of his risk factors, I recommended that he be admitted to the hospital as a new onset angina until proven otherwise.  An EKG in the office was normal, but he was pain-free at the time I saw him in the office.  PAST MEDICAL HISTORY: 1. Hypertension. 2. Hypothyroidism, diagnosed in April of 1999. 3. Mild chronic renal insufficiency, creatinine baseline between 1.9 and 1.5.    Most recent creatinine was obtained on May 01, 2001, and was 1.5 then. 4. Borderline diabetes with hemoglobin A1C of 7.0 and fasting blood sugar of    146, recorded in April of 2003, felt to be related to obesity.  ALLERGIES:  No known drug allergies.  MEDICATIONS:  He is presently on Norvasc 10 mg per day,  Synthroid 25 mcg per day, Diovan 160 mg q.d., and metoprolol 50 mg one-half q.a.m.  SOCIAL HISTORY:  He has an occasional beer.  He quit smoking 17 to 18 years ago.  He is Artist at [employer].  FAMILY HISTORY:  Positive for throat cancer in his brother who smoked.  His father died of alcohol-related disease.  No other cancer or premature heart disease in his family to his knowledge.  REVIEW OF SYSTEMS:  Taken in detail and essentially negative except as noted above except for some "tingling" into the left hand and also dyspnea with exertion, but not directly related to his present complaints today.  He has also noted some "trembling" intermittently that he feels is related to his "nerves."  PHYSICAL EXAMINATION:  GENERAL:  This is an anxious, but pleasant ambulatory black male in no acute distress.  VITAL SIGNS:  Blood pressure 160/100 today in the office, pulse rate is 60 and regular.  He is afebrile and normal vital signs otherwise.  HEENT:  Oropharynx is clear.  Dentition was intact.  Ear canals clear bilaterally.  Nasal turbinates are normal.  LUNGS:  Fields perfectly clear bilaterally to auscultation and percussion.  CARDIAC:  There is a regular rate and rhythm without murmurs, gallops, or rubs present.  PMI was not displaced.  ABDOMEN:  Obese and otherwise benign with no palpable organomegaly, masses, or tenderness.  No bruits.  EXTREMITIES:  Warm without calf tenderness, cyanosis, clubbing, or edema. Pedal pulses were present bilaterally.  NEUROLOGIC:  No focal deficits or pathologic reflexes.  SKIN:  Warm and dry.  LABORATORY DATA:  Oxygen saturation 99% on room air.  EKG in the office without pain was normal.  IMPRESSION:  New onset chest pain that initially occurred with exertion and now is waxing and waning during the day, lasting up to several minutes at a time, not associated with any cardiac features, but in a patient who is at very  high risk of cardiac disease based on morbid obesity, hypertension, and borderline diabetes.  PLAN:  I told him that I did not think it was likely that he was having acute myocardial infarction, but I could not rule out the possibility that some of his pain was ischemic in nature, especially since the initial onset was while he was walking up hill.  He also has poorly-controlled hypertension as noted above, also related to obesity.  I believe it is appropriate at this point, therefore, to admit him to the hospital to control his blood pressure and have active ischemia ruled out with serial enzymes and cardiology evaluation. Dictated by:   [name]. [name], M.D. [facility] Attending Physician:  Avie Echevaria DD:  08/03/01 TD:  08/04/01 Job: [id number]

## 2010-08-24 NOTE — Consult Note (Signed)
[place]. [facility]  Patient:    Matthew Keller, Matthew Keller Visit Number: [id number] MRN: [id number]          Service Type: MED Location: [phone] Attending Physician:  Ilene Qua Dictated by:   Lewayne Bunting, M.D. [facility] Proc. Date: 08/03/01 Admit Date:  08/03/2001   CC:         Casimiro Needle B. [name], M.D. [facility]   Consultation Report  REFERRING PHYSICIAN:  [name]. [name], M.D.  CHIEF COMPLAINT:  Substernal chest pain.  HISTORY OF PRESENT ILLNESS:  Matthew Keller is a 60 year old African-American male with no prior history of coronary artery disease, who now reports increasing substernal chest pressure and tightness, particularly in the subxiphoid area. The patient stated that he was in his usual state of health until a few weeks ago when he started having occasional brief episodes of a pressure-like sensation, particularly worse after eating, but also worse after exertion.  He had seen Casimiro Needle B. Wert, M.D., in the past and was given Diovan for hypertension.  The patients symptoms, however, continued to worsen.  He was at a lecture today at [employer] and after the speech felt tightness and shortness of breath.  Also, earlier last week the patient felt increased dyspnea on exertion and chest tightness associated with lightheadedness while on the treadmill.  The patient currently denies substernal chest pain.  He has no evidence of ischemia by 12-lead electrocardiogram.  He denies palpitations or syncope.  He has no orthopnea or PND.  He does report lower extremity edema.  ALLERGIES:  No known drug allergies.  MEDICATIONS: 1. Norvasc 10 mg a day. 2. Synthroid 25 mcg p.o. q.d. 3. Metoprolol 50 mg half of a tablet p.o. q.d. 4. Diovan 160 mg p.o. q.d.  PAST MEDICAL HISTORY: 1. History of hypertension. 2. Reportedly history of diet-controlled diabetes mellitus. 3. History of hypothyroidism.  SOCIAL HISTORY:  The patient lives in [place], [place], with his wife.   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  He is married and has a daughter. He does not smoke.  He denies alcohol use.  He denies herbal medication use.  FAMILY HISTORY:  Notable for mother with diabetes.  His father died of unknown causes.  There is no significant family history of coronary artery disease.  REVIEW OF SYSTEMS:  No fever or chills.  No headache or sore throat.  Positive for chest pain, shortness of breath, and dyspnea on exertion.  No PND or edema.  Positive for dizziness.  No weakness or numbness.  No frequency or dysuria.  No myalgias or arthralgias.  No nausea or vomiting.  No polyuria or polydipsia.  PHYSICAL EXAMINATION:  VITAL SIGNS:  Blood pressure 187/94, heart rate 60 beats per minute, respirations 20, temperature 98.6 degrees.  GENERAL APPEARANCE:  An obese, African-American male in no apparent distress.  HEENT:  NCAT.  PERRLA.  EOMI.  NECK:  Supple.  No bruit or JVD.  No lymphadenopathy.  HEART:  Regular rate and rhythm.  Normal S1 and S2.  No murmurs, rubs, or gallops.  LUNGS:  Clear bilaterally.  SKIN:  No rash or lesions.  ABDOMEN:  Soft and nontender.  Obese.  No bruits.  GENITOURINARY:  Deferred.  RECTAL:  Deferred.  EXTREMITIES:  No clubbing, cyanosis, or edema.  Positive for lower extremity pedal edema.  NEUROLOGIC:  The patient is alert and oriented.  Grossly nonfocal.  LABORATORY DATA:  The chest x-ray is pending.  EKG with normal sinus rhythm, rate  60 beats per minute, normal intervals, and no acute ischemic changes.  The hemoglobin is 13.7, hematocrit 40, white count 7.9, and platelet count 235.  Sodium 140, potassium 3.5, chloride 102, bicarbonate 31, BUN 10, creatinine 1.5.  LFTs are within normal limits.  IMPRESSION AND PLAN: 1. Substernal chest pain.  The patients symptoms are somewhat atypical, but    he has multiple risk factors.  He may well be a borderline diabetic.    Symptoms of dyspnea on exertion and decreased exercise  tolerance are    concerning, although they could be related to hypertensive heart disease.    However, given the patients risk factor profile, I do think that the most    prudent approach is to proceed with a cardiac catheterization.  I have    discussed the risks and benefits of the cardiac catheterization with the    patient and he is willing to proceed.  In the interim, the patient will be    placed on aspirin, beta blocker will be continued, and he will also be    placed on Lovenox anticoagulation. 2. Dyspnea.  Differential diagnosis as outlined above.  Rule out ischemic    heart disease.  Rule out hypertensive heart disease. 3. Hypertension.  Relative low potassium.  Multiple medications.  May need to    consider evaluation secondary to hypertension, particularly renovascular    hypertension or hyperaldosteronism. 4. Diabetes mellitus per Casimiro Needle B. [name], M.D. 5. Hypothyroidism on treatment.  DISPOSITION:  The patient will be ruled out for myocardial infarction and will anticipate a cardiac catheterization tomorrow. Dictated by:   Lewayne Bunting, M.D. [facility] Attending Physician:  Ilene Qua DD:  08/03/01 TD:  08/03/01 Job: [id number]

## 2010-08-24 NOTE — Assessment & Plan Note (Signed)
[facility]                             PULMONARY OFFICE NOTE   RENSO, SWETT                        MRN:          [id number]  DATE:04/15/2006                            DOB:          1950/06/26    HISTORY OF PRESENT ILLNESS:  Patient is a 60 year old African-American  male, patient of Dr. Thurston Hole, with a known history of hypertension,  diabetes mellitus, and hyperlipidemia, presents for an acute office  visit.  Patient complains over the last week he has had some nasal  congestion, intermittent mild frontal headaches.  Patient does complain  that this morning when he got up that he felt somewhat dizzy when he  turned his head, looked up, or tried to bend over to pick up something.  Patient denies any headache today, visual changes, chest pain, shortness  of breath, palpitations, presyncopal, syncopal episodes.  Patient does  have diabetes.  Last A1c was 7.1 on October 28, 2005.  Patient did have  some worsening renal insufficiency with creatinine up to 1.8 on lab work  last visit.  He was recommended to discontinue his Glucovance and begin  Actos.  Patient reports that he did not get those instructions, and, in  fact, is on Glucovance.  Unfortunately, patient had been listed on  Glucovance 2.5/500, but today we have realized that patient is actually  taking Glucovance 5/500 one in the morning and a half in the afternoon.  Patient does report that he has fasting blood sugars in the morning  averaging between 54 and 106.  Blood sugar this morning was 67.   PAST MEDICAL HISTORY:  Reviewed.   CURRENT MEDICATIONS:  Reviewed.   PHYSICAL EXAMINATION:  Patient is a morbidly obese black male in no  acute distress.  He is afebrile.  Blood pressure is 162/98.  Weight is  up 10 pounds at 288.  O2 saturation is 97% on room air.  HEENT:  PERRLA.  EOMI without nystagmus.  Nasal mucosa is pale.  Left TM  and EAC are clear.  Right EAC with some mild redness.  TM  is normal.  Posterior pharynx is clear.  NECK:  Supple without cervical adenopathy.  No JVD.  LUNGS:  Clear to auscultation bilaterally.  CARDIAC:  S1, S2, without murmurs, rubs, or gallops.  ABDOMEN:  Morbidly obese, soft, without any noted hepatosplenomegaly.  No guarding or rebound is noted.  EXTREMITIES:  Warm without any calf tenderness, cyanosis, clubbing, or  edema.  NEURO:  Alert and oriented x3.  Cranial nerves II-XII are intact.  Moves  all extremities well.  Normal gait.  Head maneuvers without nystagmus or  reproducible symptoms.   IMPRESSION/PLAN:  1. Dizziness, suspect it is multifactorial in nature.  Patient appears      to have some mild rhinitis and possible eustachian tube dysfunction      on exam.  Have recommended that he begin Veramyst 2 puffs twice      daily.  Change positions slowly.  Also, patient has been having      some low blood sugars which also could be  contributing to his      symptoms as well.  Will need to adjust diabetic medications as      below.  Patient will recheck here in 2 weeks or sooner if needed.      Patient is advised if symptoms worsen, or do not improve, he is to      contact us immediately.  2. Diabetes mellitus.  Patient is unclear exactly why patient did not      discontinue Glucovance and begin Actos.  However, at this time, we      will decrease his Glucovance down to a half a tablet twice daily      due to his frequent hypoglycemic readings.  I have recommended that      patient not skip meals, eat more frequent, smaller meals.  I will      check a BMET and A1c today and adjust medications accordingly.  3. Hypertension.  Blood pressure is currently elevated today.  Patient      has been taking some Advil recently.  I have recommended he avoid      all nonsteroidals which also could increase his blood pressure and      contribute to his worsening renal insufficiency.  4. Renal insufficiency.  Patient is advised to avoid all       nonsteroidals.  Will take a BMET today.  Patient also may need to,      if is not returned back to baseline of 1.5, I may need to      discontinue Glucovance as previously recommended and change over to      a different medication to control diabetes.  5. Complex medication regimen.  Patient's medications reviewed today.      Patient does appear to be somewhat unclear about his medications,      and have recommended that he bring in all his medicines in 2 weeks      for complete review and adjustment of his medication calendar.      Rubye Oaks, NP  Electronically Signed      [name]. [name], MD, [facility]  Electronically Signed   TP/MedQ  DD: 04/15/2006  DT: 04/15/2006  Job #: [id number]

## 2010-08-24 NOTE — Op Note (Signed)
Matthew Keller, GUGLIELMO                 ACCOUNT NO.:  [id number]   MEDICAL RECORD NO.:  [id number]          PATIENT TYPE:  AMB   LOCATION:  DSC                          FACILITY:  [facility]   PHYSICIAN:  Lubertha Basque. Dalldorf, M.D.DATE OF BIRTH:  [birth year]   DATE OF PROCEDURE:  03/26/2005  DATE OF DISCHARGE:                                 OPERATIVE REPORT   PREOPERATIVE DIAGNOSIS:  Left knee torn medial meniscus.   POSTOPERATIVE DIAGNOSES:  1.  Left knee partial medial meniscectomy.  2.  Left knee abrasion chondroplasty patellofemoral.   ANESTHESIA:  General.   ATTENDING SURGEON:  Lubertha Basque. Jerl Santos, M.D.   ASSISTANT:  Lindwood Qua, P.A.   INDICATIONS FOR PROCEDURE:  The patient is a 60 year old man with about a  year of left knee pain and swelling. This has persisted despite oral anti-  inflammatories, use of brace, and activity restriction. He underwent an MRI  scan about 5 months ago; it showed a torn medial meniscus. He has pain which  interrupts his rest and ability to play golf; and he is offered an  arthroscopy. Informed operative consent was obtained after discussion of the  possible complications of, reaction to anesthesia, and infection.   DESCRIPTION OF PROCEDURE:  The patient was taken to the operating suite  where general anesthetic was applied without difficulty. He was positioned  supine and prepped and draped in a normal sterile fashion. After the  administration of IV Kefzol, an arthroscopy of the left knee was performed  through two inferior portals. The suprapatellar pouch was benign while the  patellofemoral joint did exhibit some grade 3 and four change through the  infratrochlear groove which I addressed with a chondroplasty back to stable  surfaces. We then did do an abrasion in the central area and created a small  area of bleeding bone. In the medial compartment he did have a degenerative  and radial tear of the posterior horn of the medial meniscus,  consistent  with his MRI scan. About a 10% partial medial meniscectomy was done removing  a portion of posterior horn. There were no significant degenerative changes  in this compartment. The ACL appeared to be intact and normal; and the  lateral compartment exhibited no evidence of meniscal or articular cartilage  injury. The knee was thoroughly irrigated, followed by the placement of  Marcaine with epinephrine and morphine. Some Adaptic was placed over the  portals, followed by dry gauze and a loose Ace wrap. Estimated blood loss  and intraoperative fluids can be obtained from anesthesia records.   DISPOSITION:  The patient was extubated in the operating room and taken to  recovery room in stable addition. Plans were for him to go home same-day;  and to followup in the office in less than a week.  I will contact him by  phone tonight.      Lubertha Basque Jerl Santos, M.D.  Electronically Signed     PGD/MEDQ  D:  03/26/2005  T:  03/27/2005  Job:  [id number]

## 2010-08-24 NOTE — Assessment & Plan Note (Signed)
[facility]                             PULMONARY OFFICE NOTE   FRANCISCO, OSTROVSKY                        MRN:          [id number]  DATE:04/29/2006                            DOB:          07-08-1950    HISTORY OF PRESENT ILLNESS:  The patient is a 60 year old African  American male patient of Dr. Thurston Hole with a known history of  hypertension, diabetes mellitus, and hyperlipidemia.  Returns for a two-  week followup.  Last visit the patient had been having some dizziness  and low blood sugars.  The patient was felt to  have some mild rhinitis  and started on Veramyst nasal spray twice daily.  Lab work revealed the  patient's renal insufficiency had slightly worsened with the creatinine  moving from 1.7 to 1.8.  Blood sugars were at 214.  A1C actually was  down from 7.1 to 6.8.  Last visit Glucovance was discontinued.  The  patient was restarted on Actos at 30 mg and started on Amaryl 2 mg.  The  patient returns today reporting that his dizziness and hypoglycemic  episodes had totally resolved and is tolerating new medications well.  Blood pressure is also improved today as well.  The patient denies any  chest pain, orthopnea, PND, or leg swelling.  The patient has brought  all his medications which are correct with our medicine list.   PAST MEDICAL HISTORY:  Reviewed.   CURRENT MEDICATIONS:  Reviewed.   PHYSICAL EXAMINATION:  GENERAL:  The patient is a pleasant, morbidly  obese male in no acute distress.  VITAL SIGNS:  He is afebrile with stable vital signs.  Oxygen saturation  98% on room air.  HEENT:  Nasal mucosa is pink and moist.  NECK:  Supple without adenopathy.  LUNGS:  Lung sounds are clear.  CARDIAC:  S1 and S2 without murmur, rub, or gallop.  ABDOMEN:  Morbidly obese and soft and nontender.  EXTREMITIES:  Warm without any edema.   IMPRESSION AND PLAN:  1. Diabetes mellitus with recent hypoglycemic episode now improved      with new  medications of Amaryl and Actos.  The patient will      continue on his present regimen along with diet and exercise.  The      patient is a member a gym here locally in [place].  He was      recommended to continue with his exercise regimen as tolerated.      The patient will return back with Dr. [name] in six weeks or sooner      if needed. The patient is to check blood sugars every other day and      bring his blood sugar log to his visit.  2. Hypertension is improved today.  The patient will continue on his      current regimen and weight loss program.  3. Dizziness now resolved with improvement in blood sugar with      resolution of hypoglycemic episodes.  4. Complex medication regimen.  The patient's medication is reviewed      in  detail.  Patient education was provided.  The patient's      computerized medication calendar will be adjusted and accordingly      reviewed with this patient.  5. Mild rhinitis.  The patient's symptoms are improved.  He may switch      Veramyst to an as-needed basis.      Rubye Oaks, NP  Electronically Signed      [name]. [name], MD, [facility]  Electronically Signed   TP/MedQ  DD: 04/29/2006  DT: 04/29/2006  Job #: [id number]

## 2010-08-24 NOTE — Discharge Summary (Signed)
[place]. [facility]  Patient:    Matthew Keller, Matthew Keller Visit Number: [id number] MRN: [id number]          Service Type: MED Location: [phone] Attending Physician:  Avie Echevaria Dictated by:   Earley Favor, RN, MSN, ACNP Admit Date:  08/03/2001 Disc. Date: 08/04/01   CC:         Lewayne Bunting, M.D. [facility]   Discharge Summary  DATE OF BIRTH:  1951/01/08.  DISCHARGE DIAGNOSES: 1. Substernal chest pain, questionable etiology, with a negative cardiac    catheterization. 2. Hypertension. 3. Obesity.  HISTORY OF PRESENT ILLNESS:  Matthew Keller is a 60 year old African-American male with no previous history of coronary artery disease.  He complained of a one to two-week history of substernal pressure that usually increased, was worse with eating but one week ago, chest pain came with walking on a treadmill, was better once the activity level stopped.  He has no family history of coronary artery disease but does have a history of dyspnea on exertion and some lightheadedness with chest pain.  Due to his dyspnea and chest pain while engaged in activity, he was admitted for further evaluation and treatment with a cardiology consult.  LABORATORY DATA:  CK is 346, CK-MB is 2.8, troponin I is less than 0.01. Sodium 140, potassium 3.5, chloride 102, CO2 31, glucose 147, BUN 10, creatinine 1.5, calcium 9.7.  WBC 7.9, hemoglobin 13.7, hematocrit 40.0, platelets 235.  INR is 1.0, PTT is 28.  TSH is pending at this time.  Twelve-lead EKG shows sinus bradycardia with a ventricular rate of 109 beats per minute.  HOSPITAL COURSE:  #1 -  SUBSTERNAL CHEST PAIN OF QUESTIONABLE ETIOLOGY:  Mr. Dibello was admitted to [facility], was evaluated by [facility] Cardiology by Dr. Lewayne Bunting.  He underwent a cardiac catheterization on August 04, 2001, which noted essentially unremarkable but did have some increased left ventricular end-diastolic pressure without  significant coronary artery disease.  His recommendation was aggressive blood pressure control.  For that reason, his metoprolol was increased to 50 mg b.i.d.  His blood pressure on discharge was 127/63.  Substernal chest pain may be associated with a GI condition; therefore, he was placed on a proton pump inhibitor.  #2 -  HYPERTENSION:  Again, his metoprolol was increased to 50 mg b.i.d.  #3 -  OBESITY:  He is 276 pounds on a 5 foot 11 inch frame.  He has been given instructions on diet and exercise.  DISCHARGE MEDICATIONS: 1. Norvasc 10 mg one q.d. 2. Synthroid 75 mcg one q.d. 3. Metoprolol 50 mg one tablet b.i.d. 4. Diovan 160 mg q.d. 5. Protonix 40 mg one q.d.  DISCHARGE INSTRUCTIONS:  His diet is a low-salt, low-fat diet.  Wound care is care of his right femoral groin as per instructions.  SPECIAL INSTRUCTIONS:  He can be discharged today if he is okayed by cardiology.  He has a follow-up appointment with Dr. [name] on Aug 13, 2001.  DISPOSITION/CONDITION ON DISCHARGE:  His substernal chest pain is now relieved with the addition of proton pump inhibitors.  He has a negative cardiac catheterization and is discharged home in improved condition. Dictated by:   Earley Favor, RN, MSN, ACNP Attending Physician:  Avie Echevaria DD:  08/04/01 TD:  08/04/01 Job: [id number]

## 2010-08-24 NOTE — Cardiovascular Report (Signed)
[place]. [facility]  Patient:    Matthew Keller, Matthew Keller Visit Number: [id number] MRN: [id number]          Service Type: MED Location: [id number] Attending Physician:  Avie Echevaria Dictated by:   Daisey Must, M.D. [facility] Proc. Date: 08/04/01 Admit Date:  08/03/2001   CC:         Casimiro Needle B. [name], M.D. [facility]  Lewayne Bunting, M.D. [facility]  Cardiac Catheterization Lab   Cardiac Catheterization  PROCEDURES PERFORMED:  Left heart catheterization with coronary angiography, left ventriculography, and abdominal aortography.  INDICATIONS:  Mr. Rabenold is a 60 year old male with cardiac risk factors.  He presented to the hospital with progressive substernal chest pain.  He was referred for cardiac catheterization to rule out coronary artery disease.  PROCEDURAL NOTE:  A #6 French sheath was placed in the right femoral artery. Standard Judkins #6 French catheters were utilized.  Contrast was Omnipaque. There were no complications.  RESULTS:  HEMODYNAMICS: 1. Left ventricular pressure 162/30. 2. Aortic pressure 160/92. 3. There was no aortic valve gradient.  LEFT VENTRICULOGRAM:  Wall motion is normal.  Ejection fraction estimated at greater than or equal to 65%.  There is no mitral regurgitation.  ABDOMINAL AORTOGRAM:  Abdominal aortogram reveals normal abdominal aorta, renal arteries, and iliac arteries.  CORONARY ARTERIOGRAPHY (codominant): 1. The left main is normal.  2. The left anterior descending artery artery gives rise to a small first    diagonal and normal sized second diagonal and normal sized third diagonal.    There is a 30% stenosis in the distal LAD.  3. The left circumflex is a codominant vessel.  It gives rise to 2 small    obtuse marginal branches, a large first posterolateral branch, and a small    second posterolateral branch.  There are minor luminal irregularities in    the left circumflex.  4. The right coronary artery is a codominant  vessel ending as a normal sized    posterior descending artery.  There is less than 20% stenosis in the mid    right coronary artery.  IMPRESSIONS: 1. Normal left ventricular systolic function with elevated left ventricular    end diastolic pressure. 2. No significant coronary artery disease identified.  RECOMMENDATIONS:  Patient needs aggressive antihypertensive therapy. Dictated by:   Daisey Must, M.D. [facility] Attending Physician:  Avie Echevaria DD:  08/04/01 TD:  08/04/01 Job: [id number]

## 2010-09-05 ENCOUNTER — Ambulatory Visit: Payer: BC Managed Care – PPO | Admitting: Internal Medicine

## 2010-09-28 ENCOUNTER — Ambulatory Visit: Payer: BC Managed Care – PPO | Admitting: Internal Medicine

## 2010-10-05 ENCOUNTER — Other Ambulatory Visit: Payer: Self-pay | Admitting: Internal Medicine

## 2011-04-03 ENCOUNTER — Other Ambulatory Visit: Payer: Self-pay | Admitting: Internal Medicine

## 2011-04-05 MED ORDER — OMEPRAZOLE 20 MG PO CPDR: 20.0000 mg | DELAYED_RELEASE_CAPSULE | Freq: Every day | ORAL | Status: DC

## 2011-05-10 ENCOUNTER — Other Ambulatory Visit: Payer: Self-pay | Admitting: Internal Medicine

## 2011-05-15 ENCOUNTER — Telehealth: Payer: Self-pay | Admitting: Internal Medicine

## 2011-05-15 NOTE — Telephone Encounter (Signed)
Pt needs refill for Omeprazole until OV with Mw on 06/05/11. Refill for 1 month supply sent to his pharmacy and he is aware.

## 2011-05-17 ENCOUNTER — Telehealth: Payer: Self-pay | Admitting: Internal Medicine

## 2011-05-17 NOTE — Telephone Encounter (Signed)
Member ID # [id number]. APPROVED from 04/26/2011 through 05/16/2012. Case # [id number]. Patient and pharmacy notified.

## 2011-06-05 ENCOUNTER — Other Ambulatory Visit (INDEPENDENT_AMBULATORY_CARE_PROVIDER_SITE_OTHER): Payer: BC Managed Care – PPO

## 2011-06-05 ENCOUNTER — Encounter: Payer: Self-pay | Admitting: Internal Medicine

## 2011-06-05 ENCOUNTER — Ambulatory Visit (INDEPENDENT_AMBULATORY_CARE_PROVIDER_SITE_OTHER)
Admission: RE | Admit: 2011-06-05 | Discharge: 2011-06-05 | Disposition: A | Discharge status: Home / Self Care | Payer: BC Managed Care – PPO | Source: Ambulatory Visit | Attending: Internal Medicine | Admitting: Internal Medicine

## 2011-06-05 ENCOUNTER — Ambulatory Visit (INDEPENDENT_AMBULATORY_CARE_PROVIDER_SITE_OTHER): Payer: BC Managed Care – PPO | Admitting: Internal Medicine

## 2011-06-05 VITALS — BP 132/90 | HR 70 | Temp 97.8°F | Ht 71.0 in | Wt 296.4 lb

## 2011-06-05 DIAGNOSIS — R42 Dizziness and giddiness: Secondary | ICD-10-CM

## 2011-06-05 DIAGNOSIS — E7800 Pure hypercholesterolemia, unspecified: Secondary | ICD-10-CM

## 2011-06-05 DIAGNOSIS — I10 Essential (primary) hypertension: Secondary | ICD-10-CM

## 2011-06-05 DIAGNOSIS — R0602 Shortness of breath: Secondary | ICD-10-CM

## 2011-06-05 DIAGNOSIS — N259 Disorder resulting from impaired renal tubular function, unspecified: Secondary | ICD-10-CM

## 2011-06-05 DIAGNOSIS — E119 Type 2 diabetes mellitus without complications: Secondary | ICD-10-CM

## 2011-06-05 LAB — CBC WITH DIFFERENTIAL/PLATELET
HCT: 40.9 % (ref 39.0–52.0)
Hemoglobin: 13.6 g/dL (ref 13.0–17.0)
Lymphocytes Relative: 23.1 % (ref 12.0–46.0)
Lymphs Abs: 1.8 10*3/uL (ref 0.7–4.0)
MCHC: 33.2 g/dL (ref 30.0–36.0)
Monocytes Relative: 7.1 % (ref 3.0–12.0)
Neutro Abs: 5.2 10*3/uL (ref 1.4–7.7)
Platelets: 254 10*3/uL (ref 150.0–400.0)
RDW: 16.4 % — ABNORMAL HIGH (ref 11.5–14.6)

## 2011-06-05 LAB — HEPATIC FUNCTION PANEL
ALT: 32 U/L (ref 0–53)
Alkaline Phosphatase: 68 U/L (ref 39–117)
Bilirubin, Direct: 0.2 mg/dL (ref 0.0–0.3)
Total Protein: 7.5 g/dL (ref 6.0–8.3)

## 2011-06-05 LAB — BASIC METABOLIC PANEL
Chloride: 103 mEq/L (ref 96–112)
Potassium: 4.4 mEq/L (ref 3.5–5.1)
Sodium: 137 mEq/L (ref 135–145)

## 2011-06-05 LAB — HEMOGLOBIN A1C: Hgb A1c MFr Bld: 6.8 % — ABNORMAL HIGH (ref 4.6–6.5)

## 2011-06-05 LAB — LIPID PANEL
Total CHOL/HDL Ratio: 4
Triglycerides: 54 mg/dL (ref 0.0–149.0)

## 2011-06-05 LAB — BRAIN NATRIURETIC PEPTIDE: Pro B Natriuretic peptide (BNP): 6 pg/mL (ref 0.0–100.0)

## 2011-06-05 MED ORDER — METFORMIN HCL 500 MG PO TABS: 500.0000 mg | ORAL_TABLET | Freq: Two times a day (BID) | ORAL | Status: DC

## 2011-06-05 NOTE — Progress Notes (Signed)
Subjective:    Patient ID: Matthew Keller, male    DOB: 1950/11/10     MRN: [id number]  HPI 62 yobm with AODM, HTN,and hyperlipidemia, morbid obesity & obstructive sleep apnea. 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   08/04/08 ov  cc dizziness 5 days ago . He describes loose stools prior to this episode. Put on meclizine which makes him drowsy but helps. c/o Lt tinnitus x 4 y, neg ENT evaluation.  On 3 BP meds, Orthostatic on exam with reproducible dizziness.  Labs OK, cr 1.5 rec meclizine, no driving while dizzy or using meclizine.   October 04, 2008 Vassie Loll ov  Sleep Medicine FU >> dizziness has not recurred, went to [place] for a 9 day trip, took CPAP, using it more than 4h/ night, Epworth Sleepiness Score 5, denies daytime drowsiness or problems driving. Nasal pillows work well, c/o water in tubing sometimes.   June 13, 2009 CPX new doe steps only, x months otherwise no sob.>>no changes, labs   June 28, 2009-Returns for follow up and med review. Unfortunately he did not bring meds today, we reviewed his list and update his med calendar. Gave him a new copy to bring along with his meds next ov. He has been doing well really working on diet, lost 8lbs, has had no fried foods. . Tolerating exericse in gym. Labs last visit showed good cholestrol on diet only. CRP was at ~6. , A1C 6.7.    June 12, 2010 ov cpx>>labs   07/23/2010 Follow up and med review Pt returns for follow up and med review  Returns for follow up and med review. Unfortunately he did not bring meds today, we reviewed his list and update his med calendar. Gave him a new copy to bring along with his meds next ov. He has been doing well really working on diet, has seen nutritionist and increased intensity at gym but  Has only lost 4 lbs.  . Labs last visit showed good cholestrol on diet only. CRP was at  ~7 . A1C increased 6.7. -7.2.  [name] We discussed his DM meds and control. We have not used metformin due to renal  insufficiency. He does get fluid retention on Actos.  Labs showed TSH borderline on Levothyroxine daily. He says he is very compliant with his CPAP. No daytime hypersolomenence.  Last seen by Dr. Vassie Loll in 09/2008.  We reviewed his med list and updated his list. It appears he is only taking Minoxidil 2.5mg  daily . B/p has been borderline , emphasized that he needs to be on 5mg  daily .   06/05/2011 f/u ov/Kristene Liberati cc increased SOB with exertion over the past several months- esp when walks up steps, but seems better p the first flight of the day or after working out than it does when he does it "cold" with no assoc cp. cbgs running around 120, can't afford [place]  Sleeping ok without nocturnal  or early am exacerbation  of respiratory  c/o's or need for noct saba. Also denies any obvious fluctuation of symptoms with weather or environmental changes or other aggravating or alleviating factors except as outlined above   ROS  At present neg for  any significant sore throat, dysphagia, itching, sneezing,  nasal congestion or excess/ purulent secretions,  fever, chills, sweats, unintended wt loss, pleuritic or exertional cp, hempoptysis, orthopnea pnd or leg swelling.  Also denies presyncope, palpitations, heartburn, abdominal pain, nausea, vomiting, diarrhea  or  change in bowel or urinary habits, dysuria,hematuria,  rash, arthralgias, visual complaints, headache, numbness weakness or ataxia.       Objective:   Physical Exam GEN: A/Ox3; pleasant , NAD, well nourished . Obese male  Wt 07/2010 309   >   Wt 296 06/05/2011   HEENT:  [place]/AT,  EACs-clear, TMs-wnl, NOSE-clear, THROAT-clear, no lesions, no postnasal drip or exudate noted.   NECK:  Supple w/ fair ROM; no JVD; normal carotid impulses w/o bruits; no thyromegaly or nodules palpated; no lymphadenopathy.  RESP  Clear  P & A; w/o, wheezes/ rales/ or rhonchi.no accessory muscle use, no dullness to percussion  CARD:  RRR, no m/r/g  , no peripheral  edema, pulses intact, no cyanosis or clubbing.  GI:   Soft & nt; nml bowel sounds; no organomegaly or masses detected.  Musco: Warm bil, no deformities or joint swelling noted.   Neuro: alert, no focal deficits noted.    Skin: Warm, no lesions or rashes  CXR  06/05/2011 :  No active disease. No significant change.             Assessment & Plan:

## 2011-06-05 NOTE — Patient Instructions (Signed)
Stop [employer] metformin 500mg  twice daily with meals (bfast and supper) If sugar under 80 stop amaryl  Please schedule a follow up office visit in 4 weeks for cpx, call sooner if needed

## 2011-06-06 ENCOUNTER — Telehealth: Payer: Self-pay | Admitting: Internal Medicine

## 2011-06-06 NOTE — Assessment & Plan Note (Addendum)
Prev cardiac w/u neg x lvedp 30  But note bnp <<<100 now so this is likely just deconditioning, reviewed.

## 2011-06-06 NOTE — Assessment & Plan Note (Signed)
Lab Results  Component Value Date   CREATININE 1.7* 06/05/2011   CREATININE 1.3 06/12/2010   CREATININE 1.6* 10/24/2009     May not be able to tolerate metformin - the decision to rx was based on crdeat 1.3 - will check with pharmacy and f/u one month, sooner if worse symptomatically.

## 2011-06-06 NOTE — Assessment & Plan Note (Signed)
Marginally Adequate control on present rx, reviewed - see sob

## 2011-06-06 NOTE — Telephone Encounter (Signed)
Pt given cxr results per Dr [name].

## 2011-06-06 NOTE — Assessment & Plan Note (Signed)
Adequate control on present rx, reviewed  

## 2011-06-06 NOTE — Assessment & Plan Note (Signed)
    Not ideal control and can't afford januvia. He has mild cri but should be able to tolerate metformin  Discussed in detail all the  indications, usual  risks and alternatives  relative to the benefits with patient who agrees to proceed with trial of metformin

## 2011-06-06 NOTE — Telephone Encounter (Signed)
Pt return call. Matthew Keller  °

## 2011-07-01 ENCOUNTER — Other Ambulatory Visit: Payer: Self-pay | Admitting: Internal Medicine

## 2011-07-08 ENCOUNTER — Encounter: Payer: BC Managed Care – PPO | Admitting: Internal Medicine

## 2011-07-31 ENCOUNTER — Encounter: Payer: Self-pay | Admitting: Internal Medicine

## 2011-07-31 ENCOUNTER — Ambulatory Visit
Admission: RE | Admit: 2011-07-31 | Discharge: 2011-07-31 | Disposition: A | Discharge status: Home / Self Care | Payer: BC Managed Care – PPO | Source: Ambulatory Visit | Attending: Internal Medicine | Admitting: Internal Medicine

## 2011-07-31 ENCOUNTER — Ambulatory Visit (INDEPENDENT_AMBULATORY_CARE_PROVIDER_SITE_OTHER): Payer: BC Managed Care – PPO | Admitting: Internal Medicine

## 2011-07-31 VITALS — BP 138/78 | HR 78 | Temp 98.1°F | Ht 71.0 in | Wt 290.8 lb

## 2011-07-31 DIAGNOSIS — E039 Hypothyroidism, unspecified: Secondary | ICD-10-CM

## 2011-07-31 DIAGNOSIS — I10 Essential (primary) hypertension: Secondary | ICD-10-CM

## 2011-07-31 DIAGNOSIS — I739 Peripheral vascular disease, unspecified: Secondary | ICD-10-CM | POA: Insufficient documentation

## 2011-07-31 DIAGNOSIS — E7800 Pure hypercholesterolemia, unspecified: Secondary | ICD-10-CM

## 2011-07-31 DIAGNOSIS — N259 Disorder resulting from impaired renal tubular function, unspecified: Secondary | ICD-10-CM

## 2011-07-31 DIAGNOSIS — G4733 Obstructive sleep apnea (adult) (pediatric): Secondary | ICD-10-CM

## 2011-07-31 DIAGNOSIS — E119 Type 2 diabetes mellitus without complications: Secondary | ICD-10-CM

## 2011-07-31 NOTE — Patient Instructions (Addendum)
Stop amaryl Increase metformin to twice daily Please see patient coordinator before you leave today  to schedule for referral to podiatry and arterial doppler  Keep toes dry and apply desitin powder after bathing.  See Tammy NP around June 1  with all your medications, even over the counter meds, separated in two separate bags, the ones you take no matter what vs the ones you stop once you feel better and take only as needed when you feel you need them.   Tammy  will generate for you a new user friendly medication calendar that will put [place] all on the same page re: your medication use.     Without this process, it simply isn't possible to assure that we are providing  your outpatient care  with  the attention to detail we feel you deserve.   If we cannot assure that you're getting that kind of care,  then we cannot manage your problem effectively from this clinic.  Once you have seen Tammy and we are sure that we're all on the same page with your medication use she will arrange follow up with me.   Late add: Needs tsh, bmet next ov ? May need to stop metformin?

## 2011-07-31 NOTE — Progress Notes (Signed)
Subjective:    Patient ID: Matthew Keller, male    DOB: April 16, 1950     MRN: [id number]  HPI 30 yobm with AODM, HTN,and hyperlipidemia, morbid obesity & obstructive sleep apnea. 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     October 04, 2008 Vassie Loll ov  Sleep Medicine FU >> dizziness has not recurred, went to [place] for a 9 day trip, took CPAP, using it more than 4h/ night, Epworth Sleepiness Score 5, denies daytime drowsiness or problems driving. Nasal pillows work well, c/o water in tubing sometimes.   June 13, 2009 CPX new doe steps only, x months otherwise no sob.>>no changes, labs   June 28, 2009-Returns for follow up and med review. Unfortunately he did not bring meds today, we reviewed his list and update his med calendar. Gave him a new copy to bring along with his meds next ov. He has been doing well really working on diet, lost 8lbs, has had no fried foods. . Tolerating exericse in gym. Labs last visit showed good cholestrol on diet only. CRP was at ~6. , A1C 6.7.   07/23/2010 Follow up and med review Pt returns for follow up and med review  Returns for follow up and med review. Unfortunately he did not bring meds today, we reviewed his list and update his med calendar. Gave him a new copy to bring along with his meds next ov. He has been doing well really working on diet, has seen nutritionist and increased intensity at gym but  Has only lost 4 lbs.  . Labs last visit showed good cholestrol on diet only. CRP was at  ~7 . A1C increased 6.7. -7.2.  [name] We discussed his DM meds and control. We have not used metformin due to renal insufficiency. He does get fluid retention on Actos.  Labs showed TSH borderline on Levothyroxine daily. He says he is very compliant with his CPAP. No daytime hypersolomenence.  Last seen by Dr. Vassie Loll in 09/2008.  We reviewed his med list and updated his list. It appears he is only taking Minoxidil 2.5mg  daily . B/p has been borderline , emphasized  that he needs to be on 5mg  daily .   06/05/2011 f/u ov/Aviona Martenson cc increased SOB with exertion over the past several months- esp when walks up steps, but seems better p the first flight of the day or after working out than it does when he does it "cold" with no assoc cp. cbgs running around 120, can't afford [place]. rec Stop [place] Start metformin 500mg  twice daily with meals (bfast and supper) If sugar under 80 stop amaryl  07/31/2011 f/u ov/Taquana Bartley very confused with meds, does not recognize names, reduced metformin instead of stoppiing amaryl when sugar dropped,now better with no symptoms or high or low sugars. No cp or sob or tia/ claudication.  Sleeping ok without nocturnal  or early am exacerbation  of respiratory  c/o's or need for noct saba. Also denies any obvious fluctuation of symptoms with weather or environmental changes or other aggravating or alleviating factors except as outlined above   ROS  At present neg for  any significant sore throat, dysphagia, itching, sneezing,  nasal congestion or excess/ purulent secretions,  fever, chills, sweats, unintended wt loss, pleuritic or exertional cp, hempoptysis, orthopnea pnd or leg swelling.  Also denies presyncope, palpitations, heartburn, abdominal pain, nausea, vomiting, diarrhea  or change in bowel or urinary habits, dysuria,hematuria,  rash, arthralgias, visual complaints,  headache, numbness weakness or ataxia.    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 Ross  - LHC 08/03/01 nl coronaries and  lv fn, lvedp 30  ~Cardiac w/u by Dr Tenny Craw 5/09 neg cardiolite for ischemia, rec risk reduction  DIVERTICULOSIS colonoscopy 9/01..............................................................R Kaplan  Severe OSA- on cpap, sleep study 09/2007,..................................................Marland KitchenDr. Vassie Loll  (HE IS [employer]ER[employer])  HEALTH MAINTENANCE..................................................................................Marland KitchenWert  - Pneumovax 09/2006  - Td 04/2004  - CPX 07/31/11  Pulmonary History:  PSG shows obstructive sleep apnea with AHI 12/h, increases during REM to 50/h, lowest desaturation 75% c/w severe OSA.  6/09>> set up auto CPAP 5-20 cm, humidifier & nasal pillows  7/09>>reviewed auto CPAP download 5/29 to 7/5 , poor compliance , pressure 11.4      Objective:   Physical Exam GEN: A/Ox3; pleasant , NAD, well nourished . Obese male  Wt 07/2010 309   >   Wt 296 06/05/2011 > 290 07/31/2011    wt 303 June 13, 2009>> 295 June 28, 2009 > 303 October 24, 2009 > 300 October 24, 2009 > 309 June 12, 2010  obese amb bm nad  HEENT: nl dentition, turbinates, and orophanx. Nl external ear canals without cough reflex  NECK : without JVD/Nodes/TM/ nl carotid upstrokes bilaterally  LUNGS: no acc muscle use, clear to A and P bilaterally without cough on insp or exp maneuvers  CV: RRR no s3 or murmur or increase in P2, no edema  ABD: soft and nontender with nl excursion in the supine position. No bruits or organomegaly, bowel sounds nl  MS: warm without deformities, calf tenderness, cyanosis or clubbing  GU testes down bilat, neg ih uncircum Rectal mild bph, stool g neg Pulses reduced L vs R Skin mild tinea pedis with some skin cracking at mtps bilterally            Assessment & Plan:

## 2011-08-01 ENCOUNTER — Other Ambulatory Visit: Payer: Self-pay | Admitting: Internal Medicine

## 2011-08-01 NOTE — Assessment & Plan Note (Signed)
Adequate control on present rx, reviewed  

## 2011-08-01 NOTE — Assessment & Plan Note (Signed)
-   Target LDL < 70 due to hbp/ dm  Adequate control on present rx, reviewed

## 2011-08-01 NOTE — Assessment & Plan Note (Signed)
Low sugars on amaryl so try off this and on metformin 500 bid unless renal function deteriorates

## 2011-08-01 NOTE — Assessment & Plan Note (Signed)
(  HE IS [employer]) --PSG shows obstructive sleep apnea with AHI 12/h, increases during REM to 50/h, lowest desaturation 75% c/w severe OSA. 6/09>> set up auto CPAP 5-20 cm, humidifier & nasal pillows  7/09>>reviewed auto CPAP download 5/29 to 7/5 , poor compliance , pressure 11.4  No excess hypersomnolence, f/u Vassie Loll

## 2011-08-01 NOTE — Assessment & Plan Note (Signed)
asym pulses > venous dopplers ordered

## 2011-08-01 NOTE — Assessment & Plan Note (Signed)
Needs recheck tsh next ov

## 2011-08-01 NOTE — Assessment & Plan Note (Signed)
-   Baseline 1.6 October 24, 2009 > 1.3 June 12, 2010   Lab Results  Component Value Date   CREATININE 1.7* 06/05/2011   CREATININE 1.3 06/12/2010   CREATININE 1.6* 10/24/2009   Adequate control on present rx, reviewed

## 2011-08-01 NOTE — Assessment & Plan Note (Signed)
-   All time high 310 2009 - Target wt = 208 for BMI < 30 - Referred back to nutrition again June 12, 2010  Beginning to make progress on metformin

## 2011-08-06 ENCOUNTER — Encounter (INDEPENDENT_AMBULATORY_CARE_PROVIDER_SITE_OTHER): Payer: BC Managed Care – PPO

## 2011-08-06 DIAGNOSIS — I739 Peripheral vascular disease, unspecified: Secondary | ICD-10-CM

## 2011-08-13 ENCOUNTER — Other Ambulatory Visit: Payer: Self-pay | Admitting: Adult Health

## 2011-08-13 ENCOUNTER — Other Ambulatory Visit: Payer: Self-pay | Admitting: Internal Medicine

## 2011-08-13 ENCOUNTER — Encounter: Payer: Self-pay | Admitting: Internal Medicine

## 2011-08-13 NOTE — Progress Notes (Signed)
Quick Note:  Spoke with pt and notified of results per Dr. Wert. Pt verbalized understanding and denied any questions.  ______ 

## 2011-08-29 ENCOUNTER — Other Ambulatory Visit: Payer: Self-pay | Admitting: Adult Health

## 2011-09-11 ENCOUNTER — Encounter: Payer: BC Managed Care – PPO | Admitting: Adult Health

## 2011-10-01 ENCOUNTER — Other Ambulatory Visit (INDEPENDENT_AMBULATORY_CARE_PROVIDER_SITE_OTHER): Payer: BC Managed Care – PPO

## 2011-10-01 ENCOUNTER — Ambulatory Visit (INDEPENDENT_AMBULATORY_CARE_PROVIDER_SITE_OTHER): Payer: BC Managed Care – PPO | Admitting: Adult Health

## 2011-10-01 ENCOUNTER — Encounter: Payer: Self-pay | Admitting: Gastroenterology

## 2011-10-01 ENCOUNTER — Encounter: Payer: Self-pay | Admitting: Adult Health

## 2011-10-01 VITALS — BP 150/82 | HR 66 | Temp 98.6°F | Ht 71.0 in | Wt 295.8 lb

## 2011-10-01 DIAGNOSIS — K5730 Diverticulosis of large intestine without perforation or abscess without bleeding: Secondary | ICD-10-CM

## 2011-10-01 DIAGNOSIS — I10 Essential (primary) hypertension: Secondary | ICD-10-CM

## 2011-10-01 DIAGNOSIS — K5790 Diverticulosis of intestine, part unspecified, without perforation or abscess without bleeding: Secondary | ICD-10-CM

## 2011-10-01 DIAGNOSIS — E119 Type 2 diabetes mellitus without complications: Secondary | ICD-10-CM

## 2011-10-01 DIAGNOSIS — E039 Hypothyroidism, unspecified: Secondary | ICD-10-CM

## 2011-10-01 DIAGNOSIS — G4733 Obstructive sleep apnea (adult) (pediatric): Secondary | ICD-10-CM

## 2011-10-01 DIAGNOSIS — N259 Disorder resulting from impaired renal tubular function, unspecified: Secondary | ICD-10-CM

## 2011-10-01 LAB — BASIC METABOLIC PANEL
BUN: 16 mg/dL (ref 6–23)
CO2: 32 mEq/L (ref 19–32)
Calcium: 9.7 mg/dL (ref 8.4–10.5)
Creatinine, Ser: 1.5 mg/dL (ref 0.4–1.5)
Glucose, Bld: 153 mg/dL — ABNORMAL HIGH (ref 70–99)

## 2011-10-01 MED ORDER — SULINDAC 200 MG PO TABS: 200.0000 mg | ORAL_TABLET | Freq: Two times a day (BID) | ORAL | Status: DC | PRN

## 2011-10-01 MED ORDER — MINOXIDIL 2.5 MG PO TABS: 5.0000 mg | ORAL_TABLET | Freq: Every day | ORAL | Status: DC

## 2011-10-01 MED ORDER — METFORMIN HCL 500 MG PO TABS: 500.0000 mg | ORAL_TABLET | Freq: Two times a day (BID) | ORAL | Status: DC

## 2011-10-01 MED ORDER — OMEPRAZOLE 20 MG PO CPDR: 20.0000 mg | DELAYED_RELEASE_CAPSULE | Freq: Every day | ORAL | Status: DC

## 2011-10-01 MED ORDER — VERAPAMIL HCL 240 MG (CO) PO TB24: 240.0000 mg | ORAL_TABLET | Freq: Every day | ORAL | Status: DC

## 2011-10-01 MED ORDER — GLIMEPIRIDE 2 MG PO TABS: 2.0000 mg | ORAL_TABLET | Freq: Every day | ORAL | Status: DC

## 2011-10-01 NOTE — Patient Instructions (Addendum)
Work on weight loss,  Low sweet diet. Low salt diet.  I will call with labs.  Follow med calendar closely and bring to each visit.  Refer for routine colonscopy with Dr. Arlyce Dice.  follow up Dr. [name]  In 3 months and As needed

## 2011-10-01 NOTE — Addendum Note (Signed)
Addended by: Fenton Foy on: 10/01/2011 04:27 PM   Modules accepted: Orders

## 2011-10-01 NOTE — Assessment & Plan Note (Signed)
Advised on diet weight loss Same rx Patient's medications were reviewed today and patient education was given. Computerized medication calendar was adjusted/completed

## 2011-10-01 NOTE — Assessment & Plan Note (Signed)
Diet  Check labs May need to d/c metformin if renal fx cont to be decreased

## 2011-10-01 NOTE — Progress Notes (Signed)
Subjective:    Patient ID: Matthew Keller, male    DOB: 1950-10-22     MRN: [id number]  HPI 63 yobm with AODM, HTN,and hyperlipidemia, morbid obesity & obstructive sleep apnea. 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   October 04, 2008 Matthew Keller ov  Sleep Medicine FU >> dizziness has not recurred, went to [place] for a 9 day trip, took CPAP, using it more than 4h/ night, Epworth Sleepiness Score 5, denies daytime drowsiness or problems driving. Nasal pillows work well, c/o water in tubing sometimes.   June 13, 2009 CPX new doe steps only, x months otherwise no sob.>>no changes, labs   June 28, 2009-Returns for follow up and med review. Unfortunately he did not bring meds today, we reviewed his list and update his med calendar. Gave him a new copy to bring along with his meds next ov. He has been doing well really working on diet, lost 8lbs, has had no fried foods. . Tolerating exericse in gym. Labs last visit showed good cholestrol on diet only. CRP was at ~6. , A1C 6.7.   07/23/2010 Follow up and med review Pt returns for follow up and med review  Returns for follow up and med review. Unfortunately he did not bring meds today, we reviewed his list and update his med calendar. Gave him a new copy to bring along with his meds next ov. He has been doing well really working on diet, has seen nutritionist and increased intensity at gym but  Has only lost 4 lbs.  . Labs last visit showed good cholestrol on diet only. CRP was at  ~7 . A1C increased 6.7. -7.2.  [name] We discussed his DM meds and control. We have not used metformin due to renal insufficiency. He does get fluid retention on Actos.  Labs showed TSH borderline on Levothyroxine daily. He says he is very compliant with his CPAP. No daytime hypersolomenence.  Last seen by Matthew Keller in 09/2008.  We reviewed his med list and updated his list. It appears he is only taking Minoxidil 2.5mg  daily . B/p has been borderline , emphasized that he  needs to be on 5mg  daily .   06/05/2011 f/u ov/Wert cc increased SOB with exertion over the past several months- esp when walks up steps, but seems better p the first flight of the day or after working out than it does when he does it "cold" with no assoc cp. cbgs running around 120, can't afford [place]. rec Stop [place] Start metformin 500mg  twice daily with meals (bfast and supper) If sugar under 80 stop amaryl  07/31/2011 f/u ov/Wert very confused with meds, does not recognize names, reduced metformin instead of stoppiing amaryl when sugar dropped,now better with no symptoms or high or low sugars. No cp or sob or tia/ claudication. >amaryl stopped. Metformin stopped Arterial dopplers w/ no decreased flow c/w diabetic   10/01/2011 Follow up  Returns for follow up and med review.  We reviewed all his meds and organized them into a med calendar.  Still taking amaryl . No low sugars.  Taking metformin Twice daily  . Last scr 1.7  We discussed possible changes to DM regimen. Was not able to afford januvia in past due to cost factor.  Encouraged on weight loss and diet issues.  No chest pain or dyspnea.  Foot irritation resolved. Arterial doppler ok.  Due for routine colon.  Wears CPAP everynight.  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)  HYPOTHYROIDISM, BORDERLINE (ICD-244.9)  HYPERTENSION (ICD-401.9)  MORBID OBESITY (ICD-278.01)  - All time high 310 2009  - Target wt = 208 for BMI < 30  - Referred back to nutrition again June 12, 2010  Atypical CP........................................................................................................Matthew Keller  - LHC 08/03/01 nl coronaries and lv fn, lvedp 30  ~Cardiac w/u by Dr Tenny Keller 5/09 neg cardiolite for ischemia, rec risk reduction  DIVERTICULOSIS colonoscopy  9/01..............................................................Matthew Keller  Severe OSA- on cpap, sleep study 09/2007,..................................................Marland KitchenDr. Vassie Keller  (HE IS [employer])  HEALTH MAINTENANCE..................................................................................Marland KitchenWert  - Pneumovax 09/2006  - Td 04/2004  - CPX 07/31/11  Pulmonary History:  PSG shows obstructive sleep apnea with AHI 12/h, increases during REM to 50/h, lowest desaturation 75% c/w severe OSA.  6/09>> set up auto CPAP 5-20 cm, humidifier & nasal pillows  7/09>>reviewed auto CPAP download 5/29 to 7/5 , poor compliance , pressure 11.4    ROS:  Constitutional:   No  weight loss, night sweats,  Fevers, chills, fatigue, or  lassitude.  HEENT:   No headaches,  Difficulty swallowing,  Tooth/dental problems, or  Sore throat,                No sneezing, itching, ear ache, nasal congestion, post nasal drip,   CV:  No chest pain,  Orthopnea, PND, swelling in lower extremities, anasarca, dizziness, palpitations, syncope.   GI  No heartburn, indigestion, abdominal pain, nausea, vomiting, diarrhea, change in bowel habits, loss of appetite, bloody stools.   Resp: No shortness of breath with exertion or at rest.  No excess mucus, no productive cough,  No non-productive cough,  No coughing up of blood.  No change in color of mucus.  No wheezing.  No chest wall deformity  Skin: no rash or lesions.  GU: no dysuria, change in color of urine, no urgency or frequency.  No flank pain, no hematuria   MS:  No joint pain or swelling.  No decreased range of motion.     Psych:  No change in mood or affect. No depression or anxiety.  No memory loss.        Objective:   Physical Exam GEN: A/Ox3; pleasant , NAD, well nourished . Obese male  Wt 07/2010 309   >   Wt 296 06/05/2011 > 290 07/31/2011    wt 303 June 13, 2009>> 295 June 28, 2009 > 303 October 24, 2009 > 300 October 24, 2009 > 309 June 12, 2010 >295  10/01/2011  obese amb bm nad  HEENT: nl dentition, turbinates, and orophanx. Nl external ear canals without cough reflex  NECK : without JVD/Nodes/TM/ nl carotid upstrokes bilaterally  LUNGS: no acc muscle use, clear to A and P bilaterally without cough on insp or exp maneuvers  CV: RRR no s3 or murmur or increase in P2, no edema  ABD: soft and nontender with nl excursion in the supine position. No bruits or organomegaly, bowel sounds nl  MS: warm without deformities, calf tenderness, cyanosis or clubbing   Skin clear             Assessment & Plan:

## 2011-10-01 NOTE — Assessment & Plan Note (Signed)
Cont on CPAP  Weight loss

## 2011-10-01 NOTE — Assessment & Plan Note (Signed)
F/u bmet  Advised to use nsaids cautiously  Consider d/c diovan hct +/- metformin if scr tr up .  Advised to limit nsaids use.

## 2011-10-01 NOTE — Assessment & Plan Note (Signed)
tsh today

## 2011-10-02 ENCOUNTER — Telehealth: Payer: Self-pay | Admitting: Adult Health

## 2011-10-02 MED ORDER — LEVOTHYROXINE SODIUM 75 MCG PO TABS: 75.0000 ug | ORAL_TABLET | Freq: Every day | ORAL | Status: DC

## 2011-10-02 NOTE — Addendum Note (Signed)
Addended by: Boone Master E on: 10/02/2011 12:29 PM   Modules accepted: Orders

## 2011-10-02 NOTE — Telephone Encounter (Signed)
Per 6.25.13 lab results: Result Notes     Notes Recorded by Sherre Lain, MA on 10/01/2011 at 5:22 PM LMOM TCB x1. ------  Notes Recorded by Julio Sicks, NP on 10/01/2011 at 1:44 PM Labs are improved  Cont on current regimen follow up as planned and As needed     Pt returned call from 6.25.13 regarding his lab results.  Pt is aware of his lab results / recs as stated by TP and denied any questions.  Nothing further needed; will sign off.

## 2011-10-25 ENCOUNTER — Encounter: Payer: Self-pay | Admitting: Gastroenterology

## 2011-10-30 ENCOUNTER — Ambulatory Visit (AMBULATORY_SURGERY_CENTER): Payer: BC Managed Care – PPO

## 2011-10-30 ENCOUNTER — Encounter: Payer: Self-pay | Admitting: Gastroenterology

## 2011-10-30 DIAGNOSIS — Z1211 Encounter for screening for malignant neoplasm of colon: Secondary | ICD-10-CM

## 2011-10-30 MED ORDER — MOVIPREP 100 G PO SOLR: 1.0000 | Freq: Once | ORAL | Status: DC

## 2011-11-12 ENCOUNTER — Ambulatory Visit (AMBULATORY_SURGERY_CENTER): Payer: BC Managed Care – PPO | Admitting: Gastroenterology

## 2011-11-12 ENCOUNTER — Encounter: Payer: Self-pay | Admitting: Gastroenterology

## 2011-11-12 VITALS — BP 142/84 | HR 92 | Temp 97.7°F | Resp 19 | Ht 71.0 in | Wt 286.0 lb

## 2011-11-12 DIAGNOSIS — D126 Benign neoplasm of colon, unspecified: Secondary | ICD-10-CM

## 2011-11-12 DIAGNOSIS — K5730 Diverticulosis of large intestine without perforation or abscess without bleeding: Secondary | ICD-10-CM

## 2011-11-12 DIAGNOSIS — Z1211 Encounter for screening for malignant neoplasm of colon: Secondary | ICD-10-CM

## 2011-11-12 LAB — GLUCOSE, CAPILLARY: Glucose-Capillary: 119 mg/dL — ABNORMAL HIGH (ref 70–99)

## 2011-11-12 MED ORDER — SODIUM CHLORIDE 0.9 % IV SOLN: 500.0000 mL | INTRAVENOUS | Status: DC

## 2011-11-12 NOTE — Progress Notes (Signed)
Patient did not experience any of the following events: a burn prior to discharge; a fall within the facility; wrong site/side/patient/procedure/implant event; or a hospital transfer or hospital admission upon discharge from the facility. (G8907) Patient did not have preoperative order for IV antibiotic SSI prophylaxis. (G8918)  

## 2011-11-12 NOTE — Op Note (Signed)
[facility] [address] [place], [place]  [address]  COLONOSCOPY PROCEDURE REPORT  PATIENT:  Matthew Keller, Matthew Keller  MR#:  [id number] BIRTHDATE:  1950-11-19, 60 yrs. old  GENDER:  male ENDOSCOPIST:  Barbette Hair. Arlyce Dice, MD REF. BY:  [name]. [name], M.D. PROCEDURE DATE:  11/12/2011 PROCEDURE:  Colonoscopy with snare polypectomy, Colon with cold biopsy polypectomy ASA CLASS:  Class II INDICATIONS:  Routine Risk Screening MEDICATIONS:   MAC sedation, administered by CRNA propofol 120mg IV  DESCRIPTION OF PROCEDURE:   After the risks benefits and alternatives of the procedure were thoroughly explained, informed consent was obtained.  Digital rectal exam was performed and revealed no abnormalities.   The LB CF-[id number] endoscope was introduced through the anus and advanced to the cecum, which was identified by both the appendix and ileocecal valve, without limitations.  The quality of the prep was excellent, using MoviPrep.  The instrument was then slowly withdrawn as the colon was fully examined. <<PROCEDUREIMAGES>>  FINDINGS:  Two polyps were found in the descending colon (see image3 and image4). 4 and 2mm sessile polyps - removed with cold snare, cold bx forceps, respectively, submitted to pathology Scattered diverticula were found in the descending colon (see image2).  This was otherwise a normal examination of the colon (see image1 and image5).   Retroflexed views in the rectum revealed no abnormalities.    The time to cecum =  1) 2.50 minutes. The scope was then withdrawn in  1) 10.0  minutes from the cecum and the procedure completed. COMPLICATIONS:  None ENDOSCOPIC IMPRESSION: 1) Two polyps in the descending colon 2) Diverticula, scattered in the descending colon 3) Otherwise normal examination RECOMMENDATIONS: 1) If the polyp(s) removed today are proven to be adenomatous (pre-cancerous) polyps, you will need a repeat colonoscopy in 5 years. Otherwise you should  continue to follow colorectal cancer screening guidelines for "routine risk" patients with colonoscopy in 10 years. You will receive a letter within 1-2 weeks with the results of your biopsy as well as final recommendations. Please call my office if you have not received a letter after 3 weeks. REPEAT EXAM:  You will receive a letter from Dr. Arlyce Dice in 1-2 weeks, after reviewing the final pathology, with followup recommendations.  ______________________________ Barbette Hair Arlyce Dice, MD  CC:  n. eSIGNED:   Barbette Hair. Sidrah Harden at 11/12/2011 09:08 AM  [facility], [phone]

## 2011-11-12 NOTE — Patient Instructions (Addendum)
YOU HAD AN ENDOSCOPIC PROCEDURE TODAY AT THE [facility]: Refer to the procedure report that was given to you for any specific questions about what was found during the examination.  If the procedure report does not answer your questions, please call your gastroenterologist to clarify.  If you requested that your care partner not be given the details of your procedure findings, then the procedure report has been included in a sealed envelope for you to review at your convenience later.  YOU SHOULD EXPECT: Some feelings of bloating in the abdomen. Passage of more gas than usual.  Walking can help get rid of the air that was put into your GI tract during the procedure and reduce the bloating. If you had a lower endoscopy (such as a colonoscopy or flexible sigmoidoscopy) you may notice spotting of blood in your stool or on the toilet paper. If you underwent a bowel prep for your procedure, then you may not have a normal bowel movement for a few days.  DIET: Your first meal following the procedure should be a light meal and then it is ok to progress to your normal diet.  A half-sandwich or bowl of soup is an example of a good first meal.  Heavy or fried foods are harder to digest and may make you feel nauseous or bloated.  Likewise meals heavy in dairy and vegetables can cause extra gas to form and this can also increase the bloating.  Drink plenty of fluids but you should avoid alcoholic beverages for 24 hours.  ACTIVITY: Your care partner should take you home directly after the procedure.  You should plan to take it easy, moving slowly for the rest of the day.  You can resume normal activity the day after the procedure however you should NOT DRIVE or use heavy machinery for 24 hours (because of the sedation medicines used during the test).    SYMPTOMS TO REPORT IMMEDIATELY: A gastroenterologist can be reached at any hour.  During normal business hours, 8:30 AM to 5:00 PM Monday through Friday,  call [phone].  After hours and on weekends, please call the GI answering service at [phone] who will take a message and have the physician on call contact you.   Following lower endoscopy (colonoscopy or flexible sigmoidoscopy):  Excessive amounts of blood in the stool  Significant tenderness or worsening of abdominal pains  Swelling of the abdomen that is new, acute  Fever of 100F or higher   FOLLOW UP: If any biopsies were taken you will be contacted by phone or by letter within the next 1-3 weeks.  Call your gastroenterologist if you have not heard about the biopsies in 3 weeks.  Our staff will call the home number listed on your records the next business day following your procedure to check on you and address any questions or concerns that you may have at that time regarding the information given to you following your procedure. This is a courtesy call and so if there is no answer at the home number and we have not heard from you through the emergency physician on call, we will assume that you have returned to your regular daily activities without incident.  SIGNATURES/CONFIDENTIALITY: You and/or your care partner have signed paperwork which will be entered into your electronic medical record.  These signatures attest to the fact that that the information above on your After Visit Summary has been reviewed and is understood.  Full responsibility of the confidentiality of   this discharge information lies with you and/or your care-partner.   Resume medications. Information given on polyps,diverticulosis and high fiber diet with discharge instructions. 

## 2011-11-13 ENCOUNTER — Telehealth: Payer: Self-pay | Admitting: *Deleted

## 2011-11-13 NOTE — Telephone Encounter (Signed)
Left message that we called for f/u 

## 2011-11-15 ENCOUNTER — Encounter: Payer: Self-pay | Admitting: Gastroenterology

## 2011-12-17 ENCOUNTER — Telehealth: Payer: Self-pay | Admitting: Internal Medicine

## 2011-12-17 NOTE — Telephone Encounter (Signed)
Called pt x3 and left messages to schd a follow up apt. No return call back. Sent letter 12/10/11. °

## 2012-01-01 ENCOUNTER — Telehealth: Payer: Self-pay | Admitting: Internal Medicine

## 2012-01-01 NOTE — Telephone Encounter (Signed)
Member ID # [id number].  Omeprazole has been APPROVED from 01/01/12 through 05/16/2012. Pharmacy and pt notified.

## 2012-02-13 ENCOUNTER — Other Ambulatory Visit: Payer: Self-pay | Admitting: Internal Medicine

## 2012-03-23 ENCOUNTER — Other Ambulatory Visit: Payer: Self-pay | Admitting: Internal Medicine

## 2012-06-03 ENCOUNTER — Telehealth: Payer: Self-pay | Admitting: Internal Medicine

## 2012-06-03 NOTE — Telephone Encounter (Signed)
Member # [id number].  Omeprazole APPROVED from 05/13/12 through 06/03/13. Pharmacy notified.

## 2012-07-10 ENCOUNTER — Other Ambulatory Visit: Payer: Self-pay | Admitting: Internal Medicine

## 2012-07-25 ENCOUNTER — Other Ambulatory Visit: Payer: Self-pay | Admitting: Internal Medicine

## 2012-07-28 ENCOUNTER — Telehealth: Payer: Self-pay | Admitting: Internal Medicine

## 2012-07-28 ENCOUNTER — Other Ambulatory Visit: Payer: Self-pay | Admitting: Internal Medicine

## 2012-07-28 MED ORDER — VALSARTAN-HYDROCHLOROTHIAZIDE 320-25 MG PO TABS: ORAL_TABLET | ORAL | Status: DC

## 2012-07-28 NOTE — Telephone Encounter (Signed)
Pt calling back says everything is covered under his insurance can be reached at [phone].Raylene Everts

## 2012-07-28 NOTE — Telephone Encounter (Signed)
i spoke with pt. He is due for his CPX. But is going to call his insurance to make sure MW is covered. Will await pt call back before refill is sent.

## 2012-07-28 NOTE — Telephone Encounter (Signed)
I spoke with pt. appt has been made and refill sent. Nothing further was needed

## 2012-07-29 NOTE — Telephone Encounter (Signed)
Refill encounter empty. Per phone msg from 4/22, Diovan HCT sent to pharm.

## 2012-08-18 ENCOUNTER — Other Ambulatory Visit: Payer: Self-pay | Admitting: Internal Medicine

## 2012-08-25 ENCOUNTER — Ambulatory Visit (INDEPENDENT_AMBULATORY_CARE_PROVIDER_SITE_OTHER): Payer: BC Managed Care – PPO | Admitting: Internal Medicine

## 2012-08-25 ENCOUNTER — Ambulatory Visit (INDEPENDENT_AMBULATORY_CARE_PROVIDER_SITE_OTHER)
Admission: RE | Admit: 2012-08-25 | Discharge: 2012-08-25 | Disposition: A | Discharge status: Home / Self Care | Payer: BC Managed Care – PPO | Source: Ambulatory Visit | Attending: Internal Medicine | Admitting: Internal Medicine

## 2012-08-25 ENCOUNTER — Other Ambulatory Visit (INDEPENDENT_AMBULATORY_CARE_PROVIDER_SITE_OTHER): Payer: BC Managed Care – PPO

## 2012-08-25 ENCOUNTER — Encounter: Payer: Self-pay | Admitting: Internal Medicine

## 2012-08-25 VITALS — BP 138/78 | HR 70 | Temp 98.3°F | Ht 71.0 in | Wt 294.6 lb

## 2012-08-25 DIAGNOSIS — E7800 Pure hypercholesterolemia, unspecified: Secondary | ICD-10-CM

## 2012-08-25 DIAGNOSIS — G4733 Obstructive sleep apnea (adult) (pediatric): Secondary | ICD-10-CM

## 2012-08-25 DIAGNOSIS — K5790 Diverticulosis of intestine, part unspecified, without perforation or abscess without bleeding: Secondary | ICD-10-CM

## 2012-08-25 DIAGNOSIS — K5730 Diverticulosis of large intestine without perforation or abscess without bleeding: Secondary | ICD-10-CM

## 2012-08-25 DIAGNOSIS — I10 Essential (primary) hypertension: Secondary | ICD-10-CM

## 2012-08-25 DIAGNOSIS — E119 Type 2 diabetes mellitus without complications: Secondary | ICD-10-CM

## 2012-08-25 DIAGNOSIS — E039 Hypothyroidism, unspecified: Secondary | ICD-10-CM

## 2012-08-25 DIAGNOSIS — N259 Disorder resulting from impaired renal tubular function, unspecified: Secondary | ICD-10-CM

## 2012-08-25 DIAGNOSIS — N401 Enlarged prostate with lower urinary tract symptoms: Secondary | ICD-10-CM

## 2012-08-25 LAB — BASIC METABOLIC PANEL
BUN: 16 mg/dL (ref 6–23)
CO2: 32 mEq/L (ref 19–32)
Calcium: 10 mg/dL (ref 8.4–10.5)
Chloride: 103 mEq/L (ref 96–112)
Creatinine, Ser: 1.4 mg/dL (ref 0.4–1.5)

## 2012-08-25 LAB — URINALYSIS
Bilirubin Urine: NEGATIVE
Hgb urine dipstick: NEGATIVE
Ketones, ur: NEGATIVE
Nitrite: NEGATIVE
Total Protein, Urine: NEGATIVE
Urine Glucose: NEGATIVE
pH: 6 (ref 5.0–8.0)

## 2012-08-25 LAB — LIPID PANEL
Cholesterol: 169 mg/dL (ref 0–200)
LDL Cholesterol: 117 mg/dL — ABNORMAL HIGH (ref 0–99)
Total CHOL/HDL Ratio: 4
Triglycerides: 45 mg/dL (ref 0.0–149.0)

## 2012-08-25 LAB — MICROALBUMIN / CREATININE URINE RATIO: Microalb Creat Ratio: 0.7 mg/g (ref 0.0–30.0)

## 2012-08-25 LAB — HEPATIC FUNCTION PANEL
AST: 47 U/L — ABNORMAL HIGH (ref 0–37)
Alkaline Phosphatase: 72 U/L (ref 39–117)
Bilirubin, Direct: 0.1 mg/dL (ref 0.0–0.3)

## 2012-08-25 NOTE — Progress Notes (Signed)
Quick Note:  Spoke with pt and notified of results per Dr. Wert. Pt verbalized understanding and denied any questions.  ______ 

## 2012-08-25 NOTE — Progress Notes (Signed)
Subjective:    Patient ID: Matthew Keller, male    DOB: 04-11-1950     MRN: [id number]  HPI 17 yobm with AODM, HTN,and hyperlipidemia, morbid obesity & obstructive sleep apnea. 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   October 04, 2008 Matthew Keller ov  Sleep Medicine FU >> dizziness has not recurred, went to [place] for a 9 day trip, took CPAP, using it more than 4h/ night, Epworth Sleepiness Score 5, denies daytime drowsiness or problems driving. Nasal pillows work well, c/o water in tubing sometimes.   June 13, 2009 CPX new doe steps only, x months otherwise no sob.>>no changes, labs   June 28, 2009-Returns for follow up and med review. Unfortunately he did not bring meds today, we reviewed his list and update his med calendar. Gave him a new copy to bring along with his meds next ov. He has been doing well really working on diet, lost 8lbs, has had no fried foods. . Tolerating exericse in gym. Labs last visit showed good cholestrol on diet only. CRP was at ~6. , A1C 6.7.   07/23/2010 Follow up and med review Pt returns for follow up and med review  Returns for follow up and med review. Unfortunately he did not bring meds today, we reviewed his list and update his med calendar. Gave him a new copy to bring along with his meds next ov. He has been doing well really working on diet, has seen nutritionist and increased intensity at gym but  Has only lost 4 lbs.  . Labs last visit showed good cholestrol on diet only. CRP was at  ~7 . A1C increased 6.7. -7.2.  [name] We discussed his DM meds and control. We have not used metformin due to renal insufficiency. He does get fluid retention on Actos.  Labs showed TSH borderline on Levothyroxine 33mcg daily. He says he is very compliant with his CPAP. No daytime hypersolomenence.  Last seen by Dr. Elsworth Keller in 09/2008.  We reviewed his med list and updated his list. It appears he is only taking Minoxidil 2.5mg  daily . B/p has been borderline , emphasized that he  needs to be on 5mg  daily .   06/05/2011 f/u ov/Matthew Keller cc increased SOB with exertion over the past several months- esp when walks up steps, but seems better p the first flight of the day or after working out than it does when he does it "cold" with no assoc cp. cbgs running around 120, can't afford [place]. rec Stop [place] Start metformin 500mg  twice daily with meals (bfast and supper) If sugar under 80 stop amaryl  07/31/2011 f/u ov/Matthew Keller very confused with meds, does not recognize names, reduced metformin instead of stoppiing amaryl when sugar dropped,now better with no symptoms or high or low sugars. No cp or sob or tia/ claudication. >amaryl stopped. Metformin stopped Arterial dopplers w/ no decreased flow c/w diabetic   10/01/2011 Follow up  Returns for follow up and med review.  We reviewed all his meds and organized them into a med calendar.  Still taking amaryl . No low sugars.  Taking metformin Twice daily  . Last scr 1.7  We discussed possible changes to DM regimen. Was not able to afford januvia in past due to cost factor.  Encouraged on weight loss and diet issues.  No chest pain or dyspnea.  Foot irritation resolved. Arterial doppler ok.  Due for routine colon.  Wears CPAP everynight. rec Work on  weight loss,  Low sweet diet. Low salt diet.  I will call with labs.  Follow med calendar closely and bring to each visit.  Refer for routine colonscopy with Dr. Arlyce Dice.  08/25/12 Matthew Keller/ yearly comprehensive eval  No new complaints. No cp, tia or claudicaiton. 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 Ross  - LHC 08/03/01 nl coronaries and lv fn, lvedp 30  ~Cardiac w/u by Dr Tenny Craw 5/09 neg cardiolite for ischemia, rec risk reduction  DIVERTICULOSIS colonoscopy 9/01..............................................................Darrel Hoover - repeat 11/12/11   Severe OSA- on cpap, sleep study 09/2007,..................................................Marland KitchenDr. Vassie Loll  (HE IS COMMERCIAL BUS DRIVER)  HEALTH MAINTENANCE..................................................................................Marland KitchenWert  - Pneumovax 09/2006  - Td 04/2004  - CPX 08/25/12  Pulmonary History:  PSG shows obstructive sleep apnea with AHI 12/h, increases during REM to 50/h, lowest desaturation 75% c/w severe OSA.  6/09>> set up auto CPAP 5-20 cm, humidifier & nasal pillows  7/09>>reviewed auto CPAP download 5/29 to 7/5 , poor compliance , pressure 11.4             Objective:   Physical Exam GEN: A/Ox3; pleasant , NAD, well nourished . Obese male  Wt 07/2010 309   >   Wt 296 06/05/2011 > 290 07/31/2011  wt 303 June 13, 2009>> 295 June 28, 2009 > 303 October 24, 2009 > 300 October 24, 2009 > 309 June 12, 2010 >295 10/01/2011 >  295 08/25/2012  obese amb bm nad  HEENT: nl dentition, turbinates, and orophanx. Nl external ear canals without cough reflex  NECK : without JVD/Nodes/TM/ nl carotid upstrokes bilaterally  LUNGS: no acc muscle use, clear to A and P bilaterally without cough on insp or exp maneuvers  CV: RRR no s3 or murmur or increase in P2, no edema  ABD: soft and nontender with nl excursion in the supine position. No bruits or organomegaly, bowel sounds nl  MS: warm without deformities, calf tenderness, cyanosis or clubbing  Skin : no lesions GU testes down bil no nodules Rectal Mild bph, stool G neg       CXR  08/25/2012 :   No abnormality noted.    Assessment & Plan:

## 2012-08-25 NOTE — Patient Instructions (Addendum)
Please remember to go to the lab and x-ray department downstairs for your tests - we will call you with the results when they are available.     Please schedule a follow up visit in 6  months but call sooner if needed   

## 2012-08-26 NOTE — Assessment & Plan Note (Signed)
Adequate control on present rx, reviewed  

## 2012-08-26 NOTE — Assessment & Plan Note (Addendum)
-   Target LDL < 70 due to hbp/ dm  Lab Results  Component Value Date   CHOL 169 08/25/2012   HDL 42.90 08/25/2012   LDLCALC 117* 08/25/2012   TRIG 45.0 08/25/2012   CHOLHDL 4 08/25/2012    not at target yet, continue diet and ex

## 2012-08-26 NOTE — Assessment & Plan Note (Signed)
Lab Results  Component Value Date   CREATININE 1.4 08/25/2012   CREATININE 1.5 10/01/2011   CREATININE 1.7* 06/05/2011    Adequate control on present rx, reviewed

## 2012-08-26 NOTE — Assessment & Plan Note (Signed)
Adequate control on present complex rx, reviewed

## 2012-08-26 NOTE — Assessment & Plan Note (Signed)
-   All time high 310 2009 - Target wt = 208 for BMI < 30 - Referred back to nutrition again June 12, 2010   Reviewed cal bal issues, importance of reduction in cal intake, esp on the road

## 2012-08-26 NOTE — Assessment & Plan Note (Signed)
hgba1c trending up a bit > strongly doubt compliance with meds/ diet > needs ov for med reconciliation every 3 months

## 2012-08-27 NOTE — Assessment & Plan Note (Signed)
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

## 2012-08-28 ENCOUNTER — Telehealth: Payer: Self-pay | Admitting: Internal Medicine

## 2012-08-28 NOTE — Telephone Encounter (Signed)
Pt decided not to leave a message at this time.  Matthew Keller ° °

## 2012-09-01 ENCOUNTER — Telehealth: Payer: Self-pay | Admitting: *Deleted

## 2012-09-01 ENCOUNTER — Other Ambulatory Visit: Payer: Self-pay | Admitting: Internal Medicine

## 2012-09-01 NOTE — Telephone Encounter (Signed)
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

## 2012-09-01 NOTE — Telephone Encounter (Signed)
Spoke with pt and notified of recs per MW He verbalized understanding OV with RA set for 10/15/12 at 11 am Pt aware to come in 15 min prior to fill out consult forms

## 2012-09-26 ENCOUNTER — Other Ambulatory Visit: Payer: Self-pay | Admitting: Internal Medicine

## 2012-10-15 ENCOUNTER — Encounter: Payer: Self-pay | Admitting: Pulmonary Disease

## 2012-10-15 ENCOUNTER — Ambulatory Visit (INDEPENDENT_AMBULATORY_CARE_PROVIDER_SITE_OTHER): Payer: BC Managed Care – PPO | Admitting: Pulmonary Disease

## 2012-10-15 VITALS — BP 142/88 | HR 68 | Temp 97.9°F | Ht 71.0 in | Wt 294.4 lb

## 2012-10-15 DIAGNOSIS — G4733 Obstructive sleep apnea (adult) (pediatric): Secondary | ICD-10-CM

## 2012-10-15 NOTE — Patient Instructions (Addendum)
Your cpap is set at 11 cm We will check download & give you fedback

## 2012-10-15 NOTE — Progress Notes (Signed)
  Subjective:    Patient ID: Matthew Keller, male    DOB: April 02, 1951, 61 y.o.   MRN: [id number]  HPI  89 yobm with AODM, HTN,and hyperlipidemia, morbid obesity & obstructive sleep apnea. 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   PSG showed obstructive sleep apnea with AHI 12/h, increases during REM to 50/h, lowest desaturation 75% c/w severe OSA.  6/09>> set up auto CPAP 5-20 cm, humidifier & nasal pillows  7/09>> auto CPAP download 5/29 to 7/5 , poor compliance , pressure 11.4    10/15/2012 ESS 5 - he denies excessive somnolence. He reports good compliance with CPAP. Bedtime is 11 PM, sleep latency is minimal he denies nocturia no and is out of bed feeling refreshed in the a.m. He is gained 20 pounds in the last 2 years. Pressure okay, mask okay, no mask leak  Review of Systems  Constitutional: Negative for fever and unexpected weight change.  HENT: Negative for ear pain, nosebleeds, congestion, sore throat, rhinorrhea, sneezing, trouble swallowing, dental problem, postnasal drip and sinus pressure.   Eyes: Negative for redness and itching.  Respiratory: Negative for cough, chest tightness, shortness of breath and wheezing.   Cardiovascular: Negative for palpitations and leg swelling.  Gastrointestinal: Negative for nausea and vomiting.  Genitourinary: Negative for dysuria.  Musculoskeletal: Negative for joint swelling.  Skin: Negative for rash.  Neurological: Negative for headaches.  Hematological: Does not bruise/bleed easily.  Psychiatric/Behavioral: Negative for dysphoric mood. The patient is not nervous/anxious.        Objective:   Physical Exam  Gen. Pleasant, obese, in no distress ENT - no lesions, no post nasal drip Neck: No JVD, no thyromegaly, no carotid bruits Lungs: no use of accessory muscles, no dullness to percussion, decreased without rales or rhonchi  Cardiovascular: Rhythm regular, heart sounds  normal, no murmurs or gallops, no peripheral  edema Musculoskeletal: No deformities, no cyanosis or clubbing , no tremors       Assessment & Plan:

## 2012-10-15 NOTE — Assessment & Plan Note (Signed)
PSG shows obstructive sleep apnea with AHI 12/h, increases during REM to 50/h, lowest desaturation 75% c/w severe OSA.  pressure 11 cm based on auto download  We wil look into getting him a new cpap perhaps in a years time He appears to be compliant by history  Weight loss encouraged, compliance with goal of at least 4-6 hrs every night is the expectation. Advised against medications with sedative side effects Cautioned against driving when sleepy - understanding that sleepiness will vary on a day to day basis

## 2012-10-22 ENCOUNTER — Other Ambulatory Visit: Payer: Self-pay | Admitting: Adult Health

## 2012-10-22 ENCOUNTER — Other Ambulatory Visit: Payer: Self-pay | Admitting: Internal Medicine

## 2012-11-21 ENCOUNTER — Other Ambulatory Visit: Payer: Self-pay | Admitting: Adult Health

## 2012-12-10 ENCOUNTER — Telehealth: Payer: Self-pay | Admitting: Pulmonary Disease

## 2012-12-10 NOTE — Telephone Encounter (Signed)
L,momtcb x1 for pt

## 2012-12-11 NOTE — Telephone Encounter (Signed)
lmomtcb x2 for pt 

## 2012-12-14 NOTE — Telephone Encounter (Signed)
Called, spoke with pt.  He will contact [facility] today.

## 2012-12-18 ENCOUNTER — Telehealth: Payer: Self-pay | Admitting: Pulmonary Disease

## 2012-12-18 NOTE — Telephone Encounter (Signed)
Download on CPAP 9 cm 12/13/12 >> CPAP very effective when used Compliance needs to improve, several missed days  goal of at least 4-6 hrs every night is the expectation.

## 2012-12-18 NOTE — Telephone Encounter (Signed)
lmtcb x1 w/ spouse 

## 2012-12-21 ENCOUNTER — Telehealth: Payer: Self-pay | Admitting: Internal Medicine

## 2012-12-21 ENCOUNTER — Other Ambulatory Visit: Payer: Self-pay | Admitting: Internal Medicine

## 2012-12-21 NOTE — Telephone Encounter (Signed)
Spoke with patient-- Patient reports he has been having cough and congestion x 2-3 days Worsening at night, cough is productive with clear mucus however mucus is difficult to clear from throat at times Patient states he has tried mucinex , benadryl and lemon teat all which have not provided much relief Patient concerned about getting sx treated before it turns into pneumonia, patient also requesting recs from Dr. [name] bc he is worried about what medications may interact with his BP Dr. [name] please advise, thank you  Last OV 10/15/12 Next OV 10/15/13  No Known Allergies  Pharmacy: [facility] [address]

## 2012-12-21 NOTE — Telephone Encounter (Signed)
He should try adding pepcid 20 mg at bedtime and chlortrimeton 4 mg at bedtime for now  For cough best option is delsym 2 tsp every 12 hours > f/u with Tammy NP or me if not improving by end of week, sooner if needed

## 2012-12-21 NOTE — Telephone Encounter (Signed)
Called, spoke with pt.  Informed him of below per Dr. [name].  Pt verbalized understanding of this and is to call back for OV if symptoms do not improve or worsen and is to seek emergency care if needed.

## 2012-12-22 NOTE — Telephone Encounter (Signed)
lmomtcb x2 for pt 

## 2012-12-25 NOTE — Telephone Encounter (Signed)
I spoke with spouse and she is aware of results. Nothing further needed

## 2012-12-31 ENCOUNTER — Encounter: Payer: Self-pay | Admitting: Pulmonary Disease

## 2013-04-12 ENCOUNTER — Ambulatory Visit (INDEPENDENT_AMBULATORY_CARE_PROVIDER_SITE_OTHER): Payer: BC Managed Care – PPO | Admitting: Internal Medicine

## 2013-04-12 ENCOUNTER — Encounter (INDEPENDENT_AMBULATORY_CARE_PROVIDER_SITE_OTHER): Payer: Self-pay

## 2013-04-12 ENCOUNTER — Other Ambulatory Visit (INDEPENDENT_AMBULATORY_CARE_PROVIDER_SITE_OTHER): Payer: BC Managed Care – PPO

## 2013-04-12 ENCOUNTER — Encounter: Payer: Self-pay | Admitting: Internal Medicine

## 2013-04-12 VITALS — BP 138/80 | HR 76 | Temp 97.9°F | Ht 69.5 in | Wt 288.0 lb

## 2013-04-12 DIAGNOSIS — G4733 Obstructive sleep apnea (adult) (pediatric): Secondary | ICD-10-CM

## 2013-04-12 DIAGNOSIS — E039 Hypothyroidism, unspecified: Secondary | ICD-10-CM

## 2013-04-12 DIAGNOSIS — E119 Type 2 diabetes mellitus without complications: Secondary | ICD-10-CM

## 2013-04-12 DIAGNOSIS — I10 Essential (primary) hypertension: Secondary | ICD-10-CM

## 2013-04-12 LAB — BASIC METABOLIC PANEL
BUN: 16 mg/dL (ref 6–23)
CO2: 29 mEq/L (ref 19–32)
Calcium: 10 mg/dL (ref 8.4–10.5)
Chloride: 101 mEq/L (ref 96–112)
Creatinine, Ser: 1.5 mg/dL (ref 0.4–1.5)
GFR: 60.58 mL/min (ref >60.00)
Glucose, Bld: 185 mg/dL — ABNORMAL HIGH (ref 70–99)
Potassium: 3.6 mEq/L (ref 3.5–5.1)
SODIUM: 137 meq/L (ref 135–145)

## 2013-04-12 LAB — HEMOGLOBIN A1C: HEMOGLOBIN A1C: 7.9 % — AB (ref 4.6–6.5)

## 2013-04-12 LAB — TSH: TSH: 2.28 u[IU]/mL (ref 0.35–5.50)

## 2013-04-12 MED ORDER — MINOXIDIL 2.5 MG PO TABS: ORAL_TABLET | ORAL | Status: DC

## 2013-04-12 NOTE — Progress Notes (Signed)
Subjective:    Patient ID: Matthew Keller, male    DOB: 04-11-1950     MRN: [id number]  HPI 17 yobm with AODM, HTN,and hyperlipidemia, morbid obesity & obstructive sleep apnea. 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   October 04, 2008 [name] ov  Sleep Medicine FU >> dizziness has not recurred, went to [place] for a 9 day trip, took CPAP, using it more than 4h/ night, Epworth Sleepiness Score 5, denies daytime drowsiness or problems driving. Nasal pillows work well, c/o water in tubing sometimes.   June 13, 2009 CPX new doe steps only, x months otherwise no sob.>>no changes, labs   June 28, 2009-Returns for follow up and med review. Unfortunately he did not bring meds today, we reviewed his list and update his med calendar. Gave him a new copy to bring along with his meds next ov. He has been doing well really working on diet, lost 8lbs, has had no fried foods. . Tolerating exericse in gym. Labs last visit showed good cholestrol on diet only. CRP was at ~6. , A1C 6.7.   07/23/2010 Follow up and med review Pt returns for follow up and med review  Returns for follow up and med review. Unfortunately he did not bring meds today, we reviewed his list and update his med calendar. Gave him a new copy to bring along with his meds next ov. He has been doing well really working on diet, has seen nutritionist and increased intensity at gym but  Has only lost 4 lbs.  . Labs last visit showed good cholestrol on diet only. CRP was at  ~7 . A1C increased 6.7. -7.2.  [name] We discussed his DM meds and control. We have not used metformin due to renal insufficiency. He does get fluid retention on Actos.  Labs showed TSH borderline on Levothyroxine 33mcg daily. He says he is very compliant with his CPAP. No daytime hypersolomenence.  Last seen by Dr. [name] in 09/2008.  We reviewed his med list and updated his list. It appears he is only taking Minoxidil 2.5mg  daily . B/p has been borderline , emphasized that he  needs to be on 5mg  daily .   06/05/2011 f/u ov/Kenslie Abbruzzese cc increased SOB with exertion over the past several months- esp when walks up steps, but seems better p the first flight of the day or after working out than it does when he does it "cold" with no assoc cp. cbgs running around 120, can't afford [place]. rec Stop [place] Start metformin 500mg  twice daily with meals (bfast and supper) If sugar under 80 stop amaryl  07/31/2011 f/u ov/Nancyjo Givhan very confused with meds, does not recognize names, reduced metformin instead of stoppiing amaryl when sugar dropped,now better with no symptoms or high or low sugars. No cp or sob or tia/ claudication. >amaryl stopped. Metformin stopped Arterial dopplers w/ no decreased flow c/w diabetic   10/01/2011 Follow up  Returns for follow up and med review.  We reviewed all his meds and organized them into a med calendar.  Still taking amaryl . No low sugars.  Taking metformin Twice daily  . Last scr 1.7  We discussed possible changes to DM regimen. Was not able to afford januvia in past due to cost factor.  Encouraged on weight loss and diet issues.  No chest pain or dyspnea.  Foot irritation resolved. Arterial doppler ok.  Due for routine colon.  Wears CPAP everynight. rec Work on  weight loss,  Low sweet diet. Low salt diet.  I will call with labs.  Follow med calendar closely and bring to each visit.  Refer for routine colonscopy with Dr. Deatra Ina.      04/12/2013 f/u ov/Carmina Walle re: hbp, dm, osa Chief Complaint  Patient presents with  . Follow-up    Doing well and denies any co's today.    Denies non-adherence but did not bring med cal, note Dr Bari Mantis concern re cpap.  No symptoms of high or low bs, no tia or claudication, sob. Denies hypersomnolence, driving bus for [employer]  No obvious day to day or daytime variabilty or assoc chronic cough or cp or chest tightness, subjective wheeze overt sinus or hb symptoms. No unusual exp hx or h/o childhood pna/ asthma or  knowledge of premature birth.  Sleeping ok without nocturnal  or early am exacerbation  of respiratory  c/o's or need for noct saba. Also denies any obvious fluctuation of symptoms with weather or environmental changes or other aggravating or alleviating factors except as outlined above   Current Medications, Allergies, Complete Past Medical History, Past Surgical History, Family History, and Social History were reviewed in [facility] record.  ROS  The following are not active complaints unless bolded sore throat, dysphagia, dental problems, itching, sneezing,  nasal congestion or excess/ purulent secretions, ear ache,   fever, chills, sweats, unintended wt loss, pleuritic or exertional cp, hemoptysis,  orthopnea pnd or leg swelling, presyncope, palpitations, heartburn, abdominal pain, anorexia, nausea, vomiting, diarrhea  or change in bowel or urinary habits, change in stools or urine, dysuria,hematuria,  rash, arthralgias, visual complaints, headache, numbness weakness or ataxia or problems with walking or coordination,  change in mood/affect or memory.             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 Ross  - LHC 08/03/01 nl coronaries and lv fn, lvedp 30  ~Cardiac w/u by Dr Harrington Challenger 5/09 neg cardiolite for ischemia, rec risk reduction  DIVERTICULOSIS colonoscopy 9/01..............................................................Doretha Sou - repeat 11/12/11   Severe OSA- on cpap, sleep study  09/2007,..................................................Marland KitchenDr. 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  6/09>> set up auto CPAP 5-20 cm, humidifier & nasal pillows  7/09>>reviewed auto CPAP download 5/29 to 7/5 , poor compliance , pressure 11.4             Objective:   Physical Exam GEN: A/Ox3; pleasant , NAD, well nourished . Obese male  Wt 07/2010 309   >   Wt 296 06/05/2011 > 290 07/31/2011  wt 303 June 13, 2009>> 295 June 28, 2009 > 303 October 24, 2009 > 300 October 24, 2009 > 309 June 12, 2010 >295 10/01/2011 >  295 08/25/2012 > 04/12/2013  288 obese amb bm nad  HEENT: nl dentition, turbinates, and orophanx. Nl external ear canals without cough reflex  NECK : without JVD/Nodes/TM/ nl carotid upstrokes bilaterally  LUNGS: no  acc muscle use, clear to A and P bilaterally without cough on insp or exp maneuvers  CV: RRR no s3 or murmur or increase in P2, no edema  ABD: soft and nontender with nl excursion in the supine position. No bruits or organomegaly, bowel sounds nl  MS: warm without deformities, calf tenderness, cyanosis or clubbing          CXR  08/25/2012 :   No abnormality noted.    Assessment & Plan:

## 2013-04-12 NOTE — Patient Instructions (Addendum)
Please remember to go to the lab   department downstairs for your tests - we will call you with the results when they are available  Please schedule a follow up office visit in 6 weeks, call sooner if needed to see Tammy NP with all your medications and any old medication calendars in hand

## 2013-04-12 NOTE — Assessment & Plan Note (Signed)
Hemoglobin A1C  Date Value Range Status  04/12/2013 7.9* 4.6 - 6.5 % Final     Glycemic Control Guidelines for People with Diabetes:Non Diabetic:  <6%Goal of Therapy: <7%Additional Action Suggested:  >8%   08/25/2012 7.3* 4.6 - 6.5 % Final     Glycemic Control Guidelines for People with Diabetes:Non Diabetic:  <6%Goal of Therapy: <7%Additional Action Suggested:  >8%   10/01/2011 6.3  4.6 - 6.5 % Final     Glycemic Control Guidelines for People with Diabetes:Non Diabetic:  <6%Goal of Therapy: <7%Additional Action Suggested:  >8%      Marginally Adequate control on present rx, reviewed > no change in rx needed , just more attention to diet and wt loss

## 2013-04-12 NOTE — Assessment & Plan Note (Signed)
Reviewed last recs with pt   Download on CPAP 9 cm 12/13/12 >> CPAP very effective when used  Compliance needs to improve, several missed days  goal of at least 4-6 hrs every night is the expectation.

## 2013-04-12 NOTE — Assessment & Plan Note (Addendum)
Lab Results  Component Value Date   TSH 2.28 04/12/2013     Adequate control on present rx, reviewed > no change in rx needed = 75 mcg per day

## 2013-04-12 NOTE — Assessment & Plan Note (Signed)
Lab Results  Component Value Date   CREATININE 1.5 04/12/2013   CREATININE 1.4 08/25/2012   CREATININE 1.5 10/01/2011     Adequate control on present rx, reviewed > no change in rx needed

## 2013-04-13 ENCOUNTER — Telehealth: Payer: Self-pay | Admitting: Internal Medicine

## 2013-04-13 NOTE — Telephone Encounter (Signed)
Notes Recorded by Tanda Rockers, MD on 04/12/2013 at 4:38 PM Call patient : Studies are ok except hbA1C slt worse which looks at longterm sugar control and isn't affected by whether he ate bfast on day of ov > rec keep working on wt loss, keep appt with Tammy   I spoke with patient about results and he verbalized understanding and had no questions

## 2013-04-13 NOTE — Progress Notes (Signed)
Quick Note:  LMTCB ______ 

## 2013-04-22 ENCOUNTER — Other Ambulatory Visit: Payer: Self-pay | Admitting: Internal Medicine

## 2013-06-01 ENCOUNTER — Encounter: Payer: BC Managed Care – PPO | Admitting: Adult Health

## 2013-06-03 ENCOUNTER — Encounter: Payer: BC Managed Care – PPO | Admitting: Adult Health

## 2013-06-21 ENCOUNTER — Other Ambulatory Visit: Payer: Self-pay | Admitting: Internal Medicine

## 2013-07-07 ENCOUNTER — Telehealth: Payer: Self-pay | Admitting: *Deleted

## 2013-07-07 NOTE — Telephone Encounter (Signed)
Called [phone] to start PA for omep 20 mg ID# [id number] Approved 06/16/13-07/07/14 Case ID#: [id number] [facility] is aware

## 2013-07-23 ENCOUNTER — Other Ambulatory Visit: Payer: Self-pay | Admitting: Internal Medicine

## 2013-08-24 ENCOUNTER — Encounter (INDEPENDENT_AMBULATORY_CARE_PROVIDER_SITE_OTHER): Payer: Self-pay

## 2013-08-24 ENCOUNTER — Ambulatory Visit (INDEPENDENT_AMBULATORY_CARE_PROVIDER_SITE_OTHER): Payer: BC Managed Care – PPO | Admitting: Adult Health

## 2013-08-24 ENCOUNTER — Encounter: Payer: Self-pay | Admitting: Adult Health

## 2013-08-24 ENCOUNTER — Other Ambulatory Visit (INDEPENDENT_AMBULATORY_CARE_PROVIDER_SITE_OTHER): Payer: BC Managed Care – PPO

## 2013-08-24 VITALS — BP 140/76 | HR 80 | Temp 98.1°F | Ht 69.5 in | Wt 288.2 lb

## 2013-08-24 DIAGNOSIS — E119 Type 2 diabetes mellitus without complications: Secondary | ICD-10-CM

## 2013-08-24 DIAGNOSIS — G4733 Obstructive sleep apnea (adult) (pediatric): Secondary | ICD-10-CM

## 2013-08-24 DIAGNOSIS — N259 Disorder resulting from impaired renal tubular function, unspecified: Secondary | ICD-10-CM

## 2013-08-24 DIAGNOSIS — I10 Essential (primary) hypertension: Secondary | ICD-10-CM

## 2013-08-24 LAB — BASIC METABOLIC PANEL
BUN: 13 mg/dL (ref 6–23)
CALCIUM: 10.2 mg/dL (ref 8.4–10.5)
CHLORIDE: 101 meq/L (ref 96–112)
CO2: 29 mEq/L (ref 19–32)
Creatinine, Ser: 1.4 mg/dL (ref 0.4–1.5)
GFR: 66.07 mL/min (ref >60.00)
Glucose, Bld: 112 mg/dL — ABNORMAL HIGH (ref 70–99)
Potassium: 4 mEq/L (ref 3.5–5.1)
Sodium: 137 mEq/L (ref 135–145)

## 2013-08-24 LAB — HEMOGLOBIN A1C: Hgb A1c MFr Bld: 7.3 % — ABNORMAL HIGH (ref 4.6–6.5)

## 2013-08-24 NOTE — Progress Notes (Signed)
Quick Note:  Spoke with pt, he is aware of results and recs. Nothing further needed at this time. ______ 

## 2013-08-24 NOTE — Assessment & Plan Note (Signed)
Controlled on current regimen  Wt loss encouraged.  Labs pending

## 2013-08-24 NOTE — Assessment & Plan Note (Signed)
Continue on current regimen  A1C improved  Cont on diet and wt loss

## 2013-08-24 NOTE — Assessment & Plan Note (Signed)
BMET stable  Cont to avoid nephrotoxins

## 2013-08-24 NOTE — Patient Instructions (Signed)
Work on weight loss,  Low sweet diet. Low salt diet.  I will call with labs.  Follow med calendar closely and bring to each visit.  follow up Dr. Melvyn Novas  In 3 months and As needed

## 2013-08-24 NOTE — Assessment & Plan Note (Signed)
Cont on CPAP At bedtime   Wt loss  follow up Dr. [name]  In 2 months

## 2013-08-24 NOTE — Progress Notes (Signed)
Subjective:    Patient ID: Matthew Keller, male    DOB: Sep 18, 1950     MRN: [id number]  HPI 80 yobm with AODM, HTN,and hyperlipidemia, morbid obesity & obstructive sleep apnea. 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   October 04, 2008 [name] ov  Sleep Medicine FU >> dizziness has not recurred, went to [place] for a 9 day trip, took CPAP, using it more than 4h/ night, Epworth Sleepiness Score 5, denies daytime drowsiness or problems driving. Nasal pillows work well, c/o water in tubing sometimes.   June 13, 2009 CPX new doe steps only, x months otherwise no sob.>>no changes, labs   June 28, 2009-Returns for follow up and med review. Unfortunately he did not bring meds today, we reviewed his list and update his med calendar. Gave him a new copy to bring along with his meds next ov. He has been doing well really working on diet, lost 8lbs, has had no fried foods. . Tolerating exericse in gym. Labs last visit showed good cholestrol on diet only. CRP was at ~6. , A1C 6.7.   07/23/2010 Follow up and med review Pt returns for follow up and med review  Returns for follow up and med review. Unfortunately he did not bring meds today, we reviewed his list and update his med calendar. Gave him a new copy to bring along with his meds next ov. He has been doing well really working on diet, has seen nutritionist and increased intensity at gym but  Has only lost 4 lbs.  . Labs last visit showed good cholestrol on diet only. CRP was at  ~7 . A1C increased 6.7. -7.2.  [name] We discussed his DM meds and control. We have not used metformin due to renal insufficiency. He does get fluid retention on Actos.  Labs showed TSH borderline on Levothyroxine 74mcg daily. He says he is very compliant with his CPAP. No daytime hypersolomenence.  Last seen by Dr. [name] in 09/2008.  We reviewed his med list and updated his list. It appears he is only taking Minoxidil 2.5mg  daily . B/p has been borderline , emphasized that he  needs to be on 5mg  daily .   06/05/2011 f/u ov/Wert cc increased SOB with exertion over the past several months- esp when walks up steps, but seems better p the first flight of the day or after working out than it does when he does it "cold" with no assoc cp. cbgs running around 120, can't afford [place]. rec Stop [place] Start metformin 500mg  twice daily with meals (bfast and supper) If sugar under 80 stop amaryl  07/31/2011 f/u ov/Wert very confused with meds, does not recognize names, reduced metformin instead of stoppiing amaryl when sugar dropped,now better with no symptoms or high or low sugars. No cp or sob or tia/ claudication. >amaryl stopped. Metformin stopped Arterial dopplers w/ no decreased flow c/w diabetic   10/01/2011 Follow up  Returns for follow up and med review.  We reviewed all his meds and organized them into a med calendar.  Still taking amaryl . No low sugars.  Taking metformin Twice daily  . Last scr 1.7  We discussed possible changes to DM regimen. Was not able to afford januvia in past due to cost factor.  Encouraged on weight loss and diet issues.  No chest pain or dyspnea.  Foot irritation resolved. Arterial doppler ok.  Due for routine colon.  Wears CPAP everynight. rec Work on  weight loss,  Low sweet diet. Low salt diet.  I will call with labs.  Follow med calendar closely and bring to each visit.  Refer for routine colonscopy with Dr. Deatra Ina.      04/12/2013 f/u ov/Wert re: hbp, dm, osa Chief Complaint  Patient presents with  . Follow-up    Doing well and denies any co's today.    Denies non-adherence but did not bring med cal, note Dr Bari Mantis concern re cpap. >labs showed A1C 7.9   08/24/2013 Follow up -HTN/DM /OSA  Returns for follow up and med review.  We reviewed all his meds and organized them into a med calendar. Appears to be taking correctly .  Says no known low or very high sugars although does not check on reg basis  Encouraged on weight  loss and diet issues.  No chest pain or dyspnea.  No skin ulcers or breakdown.  Wears CPAP everynight. No issues with mask  Has follow up with Dr. [name]  Coming up.    Current Medications, Allergies, Complete Past Medical History, Past Surgical History, Family History, and Social History were reviewed in [facility] record.  ROS  The following are not active complaints unless bolded sore throat, dysphagia, dental problems, itching, sneezing,  nasal congestion or excess/ purulent secretions, ear ache,   fever, chills, sweats, unintended wt loss, pleuritic or exertional cp, hemoptysis,  orthopnea pnd or leg swelling, presyncope, palpitations, heartburn, abdominal pain, anorexia, nausea, vomiting, diarrhea  or change in bowel or urinary habits, change in stools or urine, dysuria,hematuria,  rash, arthralgias, visual complaints, headache, numbness weakness or ataxia or problems with walking or coordination,  change in mood/affect or memory.             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 Ross  - LHC 08/03/01 nl coronaries and lv fn, lvedp 30  ~Cardiac w/u by Dr Harrington Challenger 5/09 neg cardiolite for ischemia, rec risk reduction  DIVERTICULOSIS colonoscopy 9/01..............................................................Doretha Sou - repeat 11/12/11   Severe OSA- on cpap, sleep study 09/2007,..................................................Marland KitchenDr. 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  6/09>> set up auto CPAP 5-20 cm, humidifier & nasal pillows  7/09>>reviewed auto CPAP download 5/29 to 7/5 , poor compliance , pressure 11.4             Objective:   Physical Exam GEN: A/Ox3; pleasant , NAD, well nourished . Obese male  Wt 07/2010 309   >   Wt 296 06/05/2011 > 290 07/31/2011  wt 303 June 13, 2009>> 295 June 28, 2009 > 303 October 24, 2009 > 300 October 24, 2009 > 309 June 12, 2010 >295 10/01/2011 >  295 08/25/2012 > 04/12/2013  288>08/24/2013 288  obese amb bm nad  HEENT: nl dentition, turbinates, and orophanx. Nl external ear canals without cough reflex  NECK : without JVD/Nodes/TM/ nl carotid upstrokes bilaterally  LUNGS: no acc muscle use, clear to  A and P bilaterally without cough on insp or exp maneuvers  CV: RRR no s3 or murmur or increase in P2, no edema  ABD: soft and nontender with nl excursion in the supine position. No bruits or organomegaly, bowel sounds nl  MS: warm without deformities, calf tenderness, cyanosis or clubbing          CXR  08/25/2012 :  No abnormality noted.    Assessment & Plan:

## 2013-08-29 ENCOUNTER — Other Ambulatory Visit: Payer: Self-pay | Admitting: Internal Medicine

## 2013-09-09 ENCOUNTER — Other Ambulatory Visit: Payer: Self-pay | Admitting: Internal Medicine

## 2013-09-17 ENCOUNTER — Other Ambulatory Visit: Payer: Self-pay | Admitting: Internal Medicine

## 2013-10-05 ENCOUNTER — Other Ambulatory Visit: Payer: Self-pay | Admitting: Internal Medicine

## 2013-10-13 ENCOUNTER — Other Ambulatory Visit: Payer: Self-pay | Admitting: Internal Medicine

## 2013-10-20 ENCOUNTER — Other Ambulatory Visit: Payer: Self-pay | Admitting: Internal Medicine

## 2013-10-27 ENCOUNTER — Ambulatory Visit: Payer: BC Managed Care – PPO | Admitting: Pulmonary Disease

## 2013-11-17 ENCOUNTER — Encounter: Payer: Self-pay | Admitting: Internal Medicine

## 2013-11-17 ENCOUNTER — Ambulatory Visit (INDEPENDENT_AMBULATORY_CARE_PROVIDER_SITE_OTHER): Payer: BC Managed Care – PPO | Admitting: Internal Medicine

## 2013-11-17 ENCOUNTER — Other Ambulatory Visit (INDEPENDENT_AMBULATORY_CARE_PROVIDER_SITE_OTHER): Payer: BC Managed Care – PPO

## 2013-11-17 ENCOUNTER — Ambulatory Visit (INDEPENDENT_AMBULATORY_CARE_PROVIDER_SITE_OTHER)
Admission: RE | Admit: 2013-11-17 | Discharge: 2013-11-17 | Disposition: A | Discharge status: Home / Self Care | Payer: BC Managed Care – PPO | Source: Ambulatory Visit | Attending: Internal Medicine | Admitting: Internal Medicine

## 2013-11-17 VITALS — BP 182/100 | HR 68 | Temp 98.1°F | Ht 71.0 in | Wt 290.6 lb

## 2013-11-17 DIAGNOSIS — E039 Hypothyroidism, unspecified: Secondary | ICD-10-CM

## 2013-11-17 DIAGNOSIS — E119 Type 2 diabetes mellitus without complications: Secondary | ICD-10-CM

## 2013-11-17 DIAGNOSIS — Z23 Encounter for immunization: Secondary | ICD-10-CM

## 2013-11-17 DIAGNOSIS — I10 Essential (primary) hypertension: Secondary | ICD-10-CM

## 2013-11-17 LAB — BASIC METABOLIC PANEL
BUN: 16 mg/dL (ref 6–23)
CALCIUM: 10 mg/dL (ref 8.4–10.5)
CO2: 26 mEq/L (ref 19–32)
Chloride: 101 mEq/L (ref 96–112)
Creatinine, Ser: 1.3 mg/dL (ref 0.4–1.5)
GFR: 69.47 mL/min (ref >60.00)
Glucose, Bld: 148 mg/dL — ABNORMAL HIGH (ref 70–99)
Potassium: 4.1 mEq/L (ref 3.5–5.1)
Sodium: 136 mEq/L (ref 135–145)

## 2013-11-17 LAB — TSH: TSH: 3.28 u[IU]/mL (ref 0.35–4.50)

## 2013-11-17 LAB — HEMOGLOBIN A1C: HEMOGLOBIN A1C: 7.6 % — AB (ref 4.6–6.5)

## 2013-11-17 MED ORDER — MINOXIDIL 2.5 MG PO TABS: ORAL_TABLET | ORAL | Status: DC

## 2013-11-17 NOTE — Patient Instructions (Addendum)
Pneumovax today and prevnar at age 63   Increase Minoxidil 2.5 take 2 in am and 2 in pm as per calendar  Weight control is simply a matter of calorie balance which needs to be tilted in your favor by eating less and exercising more.  To get the most out of exercise, you need to be continuously aware that you are short of breath, but never out of breath, for 30 minutes daily. As you improve, it will actually be easier for you to do the same amount of exercise  in  30 minutes so always push to the level where you are short of breath.  If this does not result in gradual weight reduction then I strongly recommend you see a nutritionist with a food diary x 2 weeks so that we can work out a negative calorie balance which is universally effective in steady weight loss programs.  Think of your calorie balance like you do your bank account where in this case you want the balance to go down so you must take in less calories than you burn up.  It's just that simple:  Hard to do, but easy to understand.  Good luck!   Please remember to go to the lab and x-ray department downstairs for your tests - we will call you with the results when they are available.  Please schedule a follow up visit in 3 months but call sooner if needed

## 2013-11-17 NOTE — Assessment & Plan Note (Signed)
hgba1c  trending up slightly   Reviewed the diet / cal bal issues again

## 2013-11-17 NOTE — Assessment & Plan Note (Signed)
Not Adequate control on present rx, reviewed > already on hctz but may need change to lasix - for now double minoxidil and observe for worse swelling and if so change to diova 320 plus lasix 40

## 2013-11-17 NOTE — Progress Notes (Signed)
Quick Note:  Spoke with pt and notified of results per Dr. Melvyn Novas. Pt verbalized understanding and denied any questions.,m  ______

## 2013-11-17 NOTE — Progress Notes (Signed)
Quick Note:  Spoke with pt and notified of results per Dr. Wert. Pt verbalized understanding and denied any questions.  ______ 

## 2013-11-17 NOTE — Assessment & Plan Note (Signed)
Adequate control on present rx, reviewed > no change in rx needed   

## 2013-11-17 NOTE — Assessment & Plan Note (Signed)
-   All time high 310 2009 - Target wt = 208 for BMI < 30 - Referred back to nutrition again June 12, 2010   Has yet to be addressed adequately > reviewed again   See instructions for specific recommendations which were reviewed directly with the patient who was given a copy with highlighter outlining the key components.

## 2013-11-17 NOTE — Progress Notes (Addendum)
Subjective:    Patient ID: Matthew Keller, male    DOB: Sep 18, 1950     MRN: [id number]  HPI 80 yobm with AODM, HTN,and hyperlipidemia, morbid obesity & obstructive sleep apnea. 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   October 04, 2008 Matthew Keller ov  Sleep Medicine FU >> dizziness has not recurred, went to [place] for a 9 day trip, took CPAP, using it more than 4h/ night, Epworth Sleepiness Score 5, denies daytime drowsiness or problems driving. Nasal pillows work well, c/o water in tubing sometimes.   June 13, 2009 CPX new doe steps only, x months otherwise no sob.>>no changes, labs   June 28, 2009-Returns for follow up and med review. Unfortunately he did not bring meds today, we reviewed his list and update his med calendar. Gave him a new copy to bring along with his meds next ov. He has been doing well really working on diet, lost 8lbs, has had no fried foods. . Tolerating exericse in gym. Labs last visit showed good cholestrol on diet only. CRP was at ~6. , A1C 6.7.   07/23/2010 Follow up and med review Pt returns for follow up and med review  Returns for follow up and med review. Unfortunately he did not bring meds today, we reviewed his list and update his med calendar. Gave him a new copy to bring along with his meds next ov. He has been doing well really working on diet, has seen nutritionist and increased intensity at gym but  Has only lost 4 lbs.  . Labs last visit showed good cholestrol on diet only. CRP was at  ~7 . A1C increased 6.7. -7.2.  [name] We discussed his DM meds and control. We have not used metformin due to renal insufficiency. He does get fluid retention on Actos.  Labs showed TSH borderline on Levothyroxine 74mcg daily. He says he is very compliant with his CPAP. No daytime hypersolomenence.  Last seen by Dr. Elsworth Keller in 09/2008.  We reviewed his med list and updated his list. It appears he is only taking Minoxidil 2.5mg  daily . B/p has been borderline , emphasized that he  needs to be on 5mg  daily .   06/05/2011 f/u ov/Matthew Keller cc increased SOB with exertion over the past several months- esp when walks up steps, but seems better p the first flight of the day or after working out than it does when he does it "cold" with no assoc cp. cbgs running around 120, can't afford [place]. rec Stop [place] Start metformin 500mg  twice daily with meals (bfast and supper) If sugar under 80 stop amaryl  07/31/2011 f/u ov/Matthew Keller very confused with meds, does not recognize names, reduced metformin instead of stoppiing amaryl when sugar dropped,now better with no symptoms or high or low sugars. No cp or sob or tia/ claudication. >amaryl stopped. Metformin stopped Arterial dopplers w/ no decreased flow c/w diabetic   10/01/2011 Follow up  Returns for follow up and med review.  We reviewed all his meds and organized them into a med calendar.  Still taking amaryl . No low sugars.  Taking metformin Twice daily  . Last scr 1.7  We discussed possible changes to DM regimen. Was not able to afford januvia in past due to cost factor.  Encouraged on weight loss and diet issues.  No chest pain or dyspnea.  Foot irritation resolved. Arterial doppler ok.  Due for routine colon.  Wears CPAP everynight. rec Work on  weight loss,  Low sweet diet. Low salt diet.  I will call with labs.  Follow med calendar closely and bring to each visit.  Refer for routine colonscopy with Dr. Deatra Keller.      04/12/2013 f/u ov/Matthew Keller re: hbp, dm, osa Chief Complaint  Patient presents with  . Follow-up    Doing well and denies any co's today.    Denies non-adherence but did not bring med cal, note Dr Matthew Keller concern re cpap. >labs showed A1C 7.9   08/24/2013 Follow up -HTN/DM /OSA  Returns for follow up and med review.  We reviewed all his meds and organized them into a med calendar. Appears to be taking correctly .  Says no known low or very high sugars although does not check on reg basis  Encouraged on weight  loss and diet issues.  No chest pain or dyspnea.  No skin ulcers or breakdown.  Wears CPAP everynight. No issues with mask  Has follow up with Dr. Elsworth Keller  Coming up rec Work on weight loss,  Low sweet diet. Low salt diet.  I will call with labs.  Follow med calendar closely and bring to each visit   11/17/2013 f/u ov/Matthew Keller re: hbp/dm/mo / no med calendar  Chief Complaint  Patient presents with  . 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 No HA, vision chnages or other co's.   no drowsiness, am ha, some mild leg swelling and admits to some discretion with dietary salt   No obvious day to day or daytime variabilty or assoc chronic cough or cp or chest tightness, subjective wheeze overt sinus or hb symptoms. No unusual exp hx or h/o childhood pna/ asthma or knowledge of premature birth.  Sleeping ok without nocturnal  or early am exacerbation  of respiratory  c/o's or need for noct saba. Also denies any obvious fluctuation of symptoms with weather or environmental changes or other aggravating or alleviating factors except as outlined above   Current Medications, Allergies, Complete Past Medical History, Past Surgical History, Family History, and Social History were reviewed in [employer] record.  ROS  The following are not active complaints unless bolded sore throat, dysphagia, dental problems, itching, sneezing,  nasal congestion or excess/ purulent secretions, ear ache,   fever, chills, sweats, unintended wt loss, pleuritic or exertional cp, hemoptysis,  orthopnea pnd or leg swelling, presyncope, palpitations, heartburn, abdominal pain, anorexia, nausea, vomiting, diarrhea  or change in bowel or urinary habits, change in stools or urine, dysuria,hematuria,  rash, arthralgias, visual complaints, headache, numbness weakness or ataxia or problems with walking or coordination,  change in mood/affect or memory.                   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 Ross  - LHC 08/03/01 nl coronaries and lv fn, lvedp 30  ~Cardiac w/u by Dr Harrington Challenger 5/09 neg cardiolite for ischemia, rec risk reduction  DIVERTICULOSIS colonoscopy 9/01..............................................................Doretha Sou - repeat 11/12/11   Severe OSA- on cpap, sleep study 09/2007,..................................................Marland KitchenDr. 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  6/09>> set up auto CPAP 5-20 cm, humidifier & nasal pillows  7/09>>reviewed auto CPAP download 5/29 to 7/5 , poor compliance , pressure 11.4             Objective:   Physical Exam GEN: A/Ox3; pleasant , NAD, well nourished . Obese male   Wt 07/2010 309   >   Wt 296 06/05/2011 > 290 07/31/2011  wt 303 June 13, 2009>> 295 June 28, 2009 > 303 October 24, 2009 > 300 October 24, 2009 > 309 June 12, 2010 >295 10/01/2011 >  295 08/25/2012 > 04/12/2013  288>08/24/2013 288 > 11/17/2013 290   obese amb bm nad  HEENT: nl dentition, turbinates, and orophanx. Nl external ear canals without cough reflex  NECK : without JVD/Nodes/TM/ nl carotid upstrokes bilaterally  LUNGS: no acc muscle use, clear to A and P bilaterally without cough on  insp or exp maneuvers  CV: RRR no s3 or murmur or increase in P2, trace bilateral ankle pitting edema  ABD: soft and nontender with nl excursion in the supine position. No bruits or organomegaly, bowel sounds nl  MS: warm without deformities, calf tenderness, cyanosis or clubbing          CXR  11/17/2013 :  No acute cardiopulmonary findings     Chemistry      Component Value Date/Time   NA 136 11/17/2013 1157   K 4.1 11/17/2013 1157   CL 101 11/17/2013 1157   CO2 26 11/17/2013 1157   BUN 16 11/17/2013 1157   CREATININE 1.3 11/17/2013 1157      Component Value Date/Time   CALCIUM 10.0 11/17/2013 1157   ALKPHOS 72 08/25/2012 0919   AST 47* 08/25/2012 0919   ALT 40 08/25/2012 0919   BILITOT 1.1 08/25/2012 0919     Lab Results  Component Value Date   HGBA1C 7.6* 11/17/2013   HGBA1C 7.3* 08/24/2013   HGBA1C 7.9* 04/12/2013   Lab Results  Component Value Date   TSH 3.28 11/17/2013      Assessment & Plan:

## 2013-11-23 ENCOUNTER — Encounter: Payer: Self-pay | Admitting: Pulmonary Disease

## 2013-11-23 ENCOUNTER — Ambulatory Visit (INDEPENDENT_AMBULATORY_CARE_PROVIDER_SITE_OTHER): Payer: BC Managed Care – PPO | Admitting: Pulmonary Disease

## 2013-11-23 VITALS — BP 126/70 | HR 70 | Temp 97.0°F | Ht 71.0 in | Wt 286.4 lb

## 2013-11-23 DIAGNOSIS — G4733 Obstructive sleep apnea (adult) (pediatric): Secondary | ICD-10-CM

## 2013-11-23 NOTE — Patient Instructions (Signed)
We will get you a new CPAP & set it at 11 cm Download in 1 m onth Weight loss encouraged, compliance with goal of at least 6 hrs every night is the expectation. Advised against medications with sedative side effects Cautioned against driving when sleepy - understanding that sleepiness will vary on a day to day basis

## 2013-11-23 NOTE — Progress Notes (Signed)
   Subjective:    Patient ID: Matthew Keller, male    DOB: 1951-03-18, 63 y.o.   MRN: [id number]  HPI  25 yobm with AODM, HTN,and hyperlipidemia, obesity & obstructive sleep apnea. 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  PSG showed obstructive sleep apnea with AHI 12/h, increases during REM to 50/h, lowest desaturation 75% c/w severe OSA.    10/2007>> auto CPAP download 5/29 to 7/5 , poor compliance , pressure 11.4   11/23/2013 Chief Complaint  Patient presents with  . Follow-up    Pt reports he wears CPAP everynight. Has current machine x 5 years and feels it half way works. He reports he can tell when he does not use the machine and feels pretty good during the day when he does use the machine.     Download on CPAP 9 cm 12/13/12 >> CPAP very effective when used  Compliance needs to improve, several missed days   he denies excessive somnolence. He reports good compliance with CPAP. Bedtime is 11 PM, sleep latency is minimal he denies nocturia no and is out of bed feeling refreshed in the a.m. Pressure okay, mask okay, no mask leak His machine is old and he requests new cpap.    Review of Systems neg for any significant sore throat, dysphagia, itching, sneezing, nasal congestion or excess/ purulent secretions, fever, chills, sweats, unintended wt loss, pleuritic or exertional cp, hempoptysis, orthopnea pnd or change in chronic leg swelling. Also denies presyncope, palpitations, heartburn, abdominal pain, nausea, vomiting, diarrhea or change in bowel or urinary habits, dysuria,hematuria, rash, arthralgias, visual complaints, headache, numbness weakness or ataxia.     Objective:   Physical Exam   Gen. Pleasant, obese, in no distress ENT - no lesions, no post nasal drip Neck: No JVD, no thyromegaly, no carotid bruits Lungs: no use of accessory muscles, no dullness to percussion, decreased without rales or rhonchi  Cardiovascular: Rhythm regular, heart sounds   normal, no murmurs or gallops, no peripheral edema Musculoskeletal: No deformities, no cyanosis or clubbing , no tremors      Assessment & Plan:

## 2013-11-23 NOTE — Assessment & Plan Note (Signed)
We will get you a new CPAP & set it at 11 cm Download in 1 m onth Weight loss encouraged, compliance with goal of at least 6 hrs every night is the expectation. Advised against medications with sedative side effects Cautioned against driving when sleepy - understanding that sleepiness will vary on a day to day basis

## 2014-01-13 ENCOUNTER — Other Ambulatory Visit: Payer: Self-pay | Admitting: Internal Medicine

## 2014-01-25 ENCOUNTER — Other Ambulatory Visit: Payer: Self-pay | Admitting: Internal Medicine

## 2014-05-05 ENCOUNTER — Other Ambulatory Visit: Payer: Self-pay | Admitting: Internal Medicine

## 2014-05-28 ENCOUNTER — Other Ambulatory Visit: Payer: Self-pay | Admitting: Internal Medicine

## 2014-06-23 ENCOUNTER — Telehealth: Payer: Self-pay | Admitting: Internal Medicine

## 2014-06-23 ENCOUNTER — Other Ambulatory Visit: Payer: Self-pay | Admitting: Internal Medicine

## 2014-06-23 MED ORDER — OMEPRAZOLE 20 MG PO CPDR: 20.0000 mg | DELAYED_RELEASE_CAPSULE | Freq: Every day | ORAL | Status: DC

## 2014-06-23 NOTE — Telephone Encounter (Signed)
Spoke with pt and he states pharmacy called him about refills that he needed and that he needed to make an appt for further refills.  Pt was not sure which meds they were talking about.  Spoke with pharmacy and they only needed rx for Omeprazole.  Refill given .  Nothing further needed.  Pt to see Dr wert on 07/12/14.

## 2014-06-26 ENCOUNTER — Other Ambulatory Visit: Payer: Self-pay | Admitting: Internal Medicine

## 2014-07-12 ENCOUNTER — Ambulatory Visit: Payer: BC Managed Care – PPO | Admitting: Internal Medicine

## 2014-07-14 ENCOUNTER — Other Ambulatory Visit: Payer: Self-pay | Admitting: Internal Medicine

## 2014-07-15 ENCOUNTER — Other Ambulatory Visit: Payer: Self-pay | Admitting: Internal Medicine

## 2014-07-19 LAB — HM DIABETES EYE EXAM

## 2014-07-26 ENCOUNTER — Other Ambulatory Visit: Payer: Self-pay | Admitting: Internal Medicine

## 2014-07-26 ENCOUNTER — Telehealth: Payer: Self-pay | Admitting: Pulmonary Disease

## 2014-07-26 DIAGNOSIS — G4733 Obstructive sleep apnea (adult) (pediatric): Secondary | ICD-10-CM

## 2014-07-26 NOTE — Telephone Encounter (Signed)
Spoke with the pt  He is needing CPAP supplies renewed, but now has [employer] ins and [facility] not contracted with them  I advised will send order to [facility] to have him switched to DME that accepts [employer]  Nothing further needed

## 2014-08-01 ENCOUNTER — Other Ambulatory Visit (INDEPENDENT_AMBULATORY_CARE_PROVIDER_SITE_OTHER): Payer: Medicare PPO

## 2014-08-01 ENCOUNTER — Ambulatory Visit (INDEPENDENT_AMBULATORY_CARE_PROVIDER_SITE_OTHER): Payer: Medicare PPO | Admitting: Internal Medicine

## 2014-08-01 ENCOUNTER — Encounter (INDEPENDENT_AMBULATORY_CARE_PROVIDER_SITE_OTHER): Payer: Self-pay

## 2014-08-01 ENCOUNTER — Other Ambulatory Visit: Payer: Self-pay | Admitting: Internal Medicine

## 2014-08-01 ENCOUNTER — Encounter: Payer: Self-pay | Admitting: Internal Medicine

## 2014-08-01 VITALS — BP 170/102 | HR 80 | Ht 71.0 in | Wt 290.4 lb

## 2014-08-01 DIAGNOSIS — I10 Essential (primary) hypertension: Secondary | ICD-10-CM

## 2014-08-01 DIAGNOSIS — E038 Other specified hypothyroidism: Secondary | ICD-10-CM | POA: Diagnosis not present

## 2014-08-01 DIAGNOSIS — Z23 Encounter for immunization: Secondary | ICD-10-CM | POA: Diagnosis not present

## 2014-08-01 DIAGNOSIS — E119 Type 2 diabetes mellitus without complications: Secondary | ICD-10-CM

## 2014-08-01 LAB — BASIC METABOLIC PANEL
BUN: 15 mg/dL (ref 6–23)
CHLORIDE: 103 meq/L (ref 96–112)
CO2: 31 mEq/L (ref 19–32)
CREATININE: 1.36 mg/dL (ref 0.40–1.50)
Calcium: 10.2 mg/dL (ref 8.4–10.5)
GFR: 67.55 mL/min (ref >60.00)
Glucose, Bld: 130 mg/dL — ABNORMAL HIGH (ref 70–99)
POTASSIUM: 4.3 meq/L (ref 3.5–5.1)
SODIUM: 138 meq/L (ref 135–145)

## 2014-08-01 LAB — HEMOGLOBIN A1C: Hgb A1c MFr Bld: 7.3 % — ABNORMAL HIGH (ref 4.6–6.5)

## 2014-08-01 LAB — TSH: TSH: 5.6 u[IU]/mL — ABNORMAL HIGH (ref 0.35–4.50)

## 2014-08-01 NOTE — Assessment & Plan Note (Signed)
Not Adequate control on present rx, reviewed > no change in rx needed      Each maintenance medication was reviewed in detail including most importantly the difference between maintenance and as needed and under what circumstances the prns are to be used. This was done in the context of a medication calendar review which provided the patient with a user-friendly unambiguous mechanism for medication administration (emphasizing check list format for maint rx) and provides an action plan for all active problems. It is critical that this be shown to every doctor  for modification during the office visit if necessary so the patient can use it as a working document.    Will ask him to return in 4 weeks for med rec.  To keep things simple, I have asked the patient to first separate medicines that are perceived as maintenance, that is to be taken daily "no matter what", from those medicines that are taken on only on an as-needed basis and I have given the patient examples of both, and then return to see our NP to generate a  New updated verified  and detailed  medication calendar which should be followed until the next physician sees the patient and updates it.

## 2014-08-01 NOTE — Progress Notes (Signed)
Subjective:    Patient ID: Matthew Keller, male    DOB: October 02, 1950     MRN: [id number]  Brief patient profile:  27 yobm quit smoking 1987 with morbid obesity complicated by    AODM, HTN,and hyperlipidemia,   & obstructive sleep apnea. 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    History of Present Illness  October 04, 2008 [name]  Sleep Medicine FU >> dizziness has not recurred, went to [place] for a 9 day trip, took CPAP, using it more than 4h/ night, Epworth Sleepiness Score 5, denies daytime drowsiness or problems driving. Nasal pillows work well, c/o water in tubing sometimes.   June 13, 2009 CPX new doe steps only, x months otherwise no sob.>>no changes, labs     08/24/2013 Follow up -HTN/DM /OSA  Returns for follow up and med review.  We reviewed all his meds and organized them into a med calendar. Appears to be taking correctly .  Says no known low or very high sugars although does not check on reg basis  Encouraged on weight loss and diet issues.  No chest pain or dyspnea.  No skin ulcers or breakdown.  Wears CPAP everynight. No issues with mask  Has follow up with Dr. [name]  Coming up rec Work on weight loss,  Low sweet diet. Low salt diet.  I will call with labs.  Follow med calendar closely and bring to each visit   11/17/2013 f/u ov/[name] re: hbp/dm/mo / no med calendar  Chief Complaint  Patient presents with  . 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 No HA, vision chnages or other co's.   no drowsiness, am ha, some mild leg swelling and admits to some discretion with dietary salt  rec Pneumovax today and prevnar at age 61  Increase Minoxidil 2.5 take 2 in am and 2 in pm as per calendar Weight control is simply a matter of calorie balance     08/01/2014 f/u ov/[name] re: hbp/ dm/ hypothyroidism   Chief Complaint  Patient presents with  . Follow-up    Pt uses CPAP 6-7 hours nightly. Denies chest  congestion/tightness, cough, wheezing, and SOB.    skipped am meds on day of ov and admits this happens a lot.   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 Also denies any obvious fluctuation of symptoms with weather or environmental changes or other aggravating or alleviating factors except as outlined above   Current Medications, Allergies, Complete Past Medical History, Past Surgical History, Family History, and Social History were reviewed in [employer] record.  ROS  The following are not active complaints unless bolded sore throat, dysphagia, dental problems, itching, sneezing,  nasal congestion or excess/ purulent secretions, ear ache,   fever, chills, sweats, unintended wt loss, pleuritic or exertional cp, hemoptysis,  orthopnea pnd or leg swelling better , presyncope, palpitations, heartburn, abdominal pain, anorexia, nausea, vomiting, diarrhea  or change in bowel or urinary habits, change in stools or urine, dysuria,hematuria,  rash, arthralgias, visual complaints, headache, numbness weakness or ataxia or problems with walking or coordination,  change in mood/affect or memory.                  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 Ross  - LHC 08/03/01 nl coronaries and lv fn, lvedp 30  ~Cardiac w/u by Dr Harrington Challenger 5/09 neg cardiolite for ischemia, rec risk reduction   DIVERTICULOSIS colonoscopy 9/01..............................................................Doretha Sou - repeat 11/12/11   Severe OSA- on cpap, sleep study 09/2007,..................................................Marland KitchenDr. 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  09/2007>> set up auto CPAP 5-20 cm, humidifier & nasal pillows  10/2007>>reviewed auto CPAP download 5/29 to 7/5 , poor compliance , pressure 11.4             Objective:   Physical Exam GEN: A/Ox3; pleasant , NAD,  Obese male nad   Wt 07/2010 309   >   Wt 296 06/05/2011 > 290 07/31/2011  wt 303 June 13, 2009>> 295 June 28, 2009 > 303 October 24, 2009 > 300 October 24, 2009 > 309 June 12, 2010 >295 10/01/2011 >  295 08/25/2012 > 04/12/2013  288>08/24/2013 288 > 11/17/2013 290> 08/01/2014   290    obese amb bm nad  HEENT: nl dentition, turbinates, and orophanx. Nl external ear canals without cough reflex  NECK : without JVD/Nodes/TM/ nl carotid upstrokes bilaterally  LUNGS: no acc muscle use, clear to A and P bilaterally without cough on insp or exp maneuvers  CV: RRR no s3 or murmur or increase in P2, trace bilateral ankle pitting edema  ABD: soft and nontender with nl excursion in the supine position. No bruits or organomegaly, bowel sounds nl  MS: warm without deformities, calf tenderness, cyanosis or clubbing         Labs ordered/ reviewed    Lab 08/01/14 1631  NA 138  K 4.3  CL 103  CO2 31  BUN 15  CREATININE 1.36  GLUCOSE 130*    Lab Results  Component Value Date   HGBA1C 7.3* 08/01/2014   HGBA1C 7.6* 11/17/2013   HGBA1C 7.3* 08/24/2013       Lab Results  Component Value Date   TSH 5.60* 08/01/2014              Assessment & Plan:

## 2014-08-01 NOTE — Assessment & Plan Note (Signed)
Adequate control on present rx, reviewed > no change in rx needed   

## 2014-08-01 NOTE — Assessment & Plan Note (Signed)
if he's taking consistently then should increase by 25 mcg per day now, if not should continue same dose consistently until f/u ov but either way recheck with lipid profile fasting on return

## 2014-08-01 NOTE — Patient Instructions (Signed)
Please remember to go to the lab   department downstairs for your tests - we will call you with the results when they are available.  See Tammy NP in 4  weeks with all your medications, even over the counter meds, separated in two separate bags, the ones you take no matter what vs the ones you stop once you feel better and take only as needed when you feel you need them.   Tammy  will generate for you a new user friendly medication calendar that will put [place] all on the same page re: your medication use.     Without this process, it simply isn't possible to assure that we are providing  your outpatient care  with  the attention to detail we feel you deserve.   If we cannot assure that you're getting that kind of care,  then we cannot manage your problem effectively from this clinic.  Once you have seen Tammy and we are sure that we're all on the same page with your medication use she will arrange follow up with me.

## 2014-08-02 ENCOUNTER — Telehealth: Payer: Self-pay | Admitting: Internal Medicine

## 2014-08-02 NOTE — Progress Notes (Signed)
Quick Note:  Called pt's home and was advised he is out of town  Called pt's cell and "wireless customer is not available, please try again later"  WCB ______

## 2014-08-02 NOTE — Telephone Encounter (Signed)
Pt is returning call to nurse. [phone]

## 2014-08-02 NOTE — Telephone Encounter (Signed)
4 weeks

## 2014-08-02 NOTE — Telephone Encounter (Signed)
Notes Recorded by Rosana Berger, CMA on 08/02/2014 at 9:53 AM Called pt's home and was advised he is out of town  Called pt's cell and "wireless customer is not available, please try again later"  WCB Notes Recorded by Tanda Rockers, MD on 08/01/2014 at 7:07 PM Call patient : Studies are unremarkable, improved except for thyroid - if he's taking consistently then should increase by 25 mcg per day now, if not should continue same dose consistently until f/u ov but either way recheck with lipid profile fasting on return  ATC- "the wireless customer is not available, please try again later" message given.  Called home number-was told by wife that patient is out of town and to keep trying to call patient on mobile number.   Will need to try again this afternoon.

## 2014-08-02 NOTE — Telephone Encounter (Signed)
Spoke with patient-he is aware of labs and states he had missed Thyroid medication due to running out and needing an appt first. Pt will make sure to take his medication regularly and wonders if and when he should have his thyroid level re-checked.   MW please advise.

## 2014-08-02 NOTE — Telephone Encounter (Signed)
Pt aware of recs.  Nothing further needed. 

## 2014-08-04 NOTE — Progress Notes (Signed)
Quick Note:  Spoke with pt and notified of results per Dr. Melvyn Novas. Pt verbalized understanding and denied any questions. Pt admits to not taking his med consisently and also taking after he eats. I advised needs to take every am on empty stomach Will recheck at next ov ______

## 2014-08-08 ENCOUNTER — Other Ambulatory Visit: Payer: Self-pay | Admitting: Internal Medicine

## 2014-08-16 ENCOUNTER — Telehealth: Payer: Self-pay | Admitting: Internal Medicine

## 2014-08-16 NOTE — Telephone Encounter (Signed)
Called and spoke to pt. Pt requesting last OV note and latest labs faxed to [facility] for DOT clearance. Records faxed to provided number. Pt verbalized understanding and denied any further questions or concerns at this time.

## 2014-08-25 ENCOUNTER — Other Ambulatory Visit: Payer: Self-pay | Admitting: Internal Medicine

## 2014-08-29 ENCOUNTER — Other Ambulatory Visit: Payer: Self-pay | Admitting: Internal Medicine

## 2014-08-29 ENCOUNTER — Ambulatory Visit (INDEPENDENT_AMBULATORY_CARE_PROVIDER_SITE_OTHER): Payer: Medicare PPO | Admitting: Adult Health

## 2014-08-29 ENCOUNTER — Encounter: Payer: Self-pay | Admitting: Adult Health

## 2014-08-29 ENCOUNTER — Encounter: Payer: Self-pay | Admitting: Internal Medicine

## 2014-08-29 VITALS — BP 156/94 | HR 79 | Temp 97.7°F | Ht 71.0 in | Wt 294.0 lb

## 2014-08-29 DIAGNOSIS — E7800 Pure hypercholesterolemia, unspecified: Secondary | ICD-10-CM

## 2014-08-29 DIAGNOSIS — G4733 Obstructive sleep apnea (adult) (pediatric): Secondary | ICD-10-CM | POA: Diagnosis not present

## 2014-08-29 DIAGNOSIS — I10 Essential (primary) hypertension: Secondary | ICD-10-CM

## 2014-08-29 DIAGNOSIS — E119 Type 2 diabetes mellitus without complications: Secondary | ICD-10-CM

## 2014-08-29 MED ORDER — MINOXIDIL 10 MG PO TABS: ORAL_TABLET | ORAL | Status: DC

## 2014-08-29 NOTE — Progress Notes (Signed)
Subjective:    Patient ID: Matthew Keller, male    DOB: [birth year]     MRN: [id number]  Brief patient profile:  55 yobm quit smoking 1987 with morbid obesity complicated by    AODM, HTN,and hyperlipidemia,   & obstructive sleep apnea. 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    History of Present Illness  October 04, 2008 [name]  Sleep Medicine FU >> dizziness has not recurred, went to [place] for a 9 day trip, took CPAP, using it more than 4h/ night, Epworth Sleepiness Score 5, denies daytime drowsiness or problems driving. Nasal pillows work well, c/o water in tubing sometimes.   June 13, 2009 CPX new doe steps only, x months otherwise no sob.>>no changes, labs     08/24/2013 Follow up -HTN/DM /OSA  Returns for follow up and med review.  We reviewed all his meds and organized them into a med calendar. Appears to be taking correctly .  Says no known low or very high sugars although does not check on reg basis  Encouraged on weight loss and diet issues.  No chest pain or dyspnea.  No skin ulcers or breakdown.  Wears CPAP everynight. No issues with mask  Has follow up with Dr. [name]  Coming up rec Work on weight loss,  Low sweet diet. Low salt diet.  I will call with labs.  Follow med calendar closely and bring to each visit   11/17/2013 f/u ov/Wert re: hbp/dm/mo / no med calendar  Chief Complaint  Patient presents with  . 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 No HA, vision chnages or other co's.   no drowsiness, am ha, some mild leg swelling and admits to some discretion with dietary salt  rec Pneumovax today and prevnar at age 1  Increase Minoxidil 2.5 take 2 in am and 2 in pm as per calendar Weight control is simply a matter of calorie balance     08/01/2014 f/u ov/Wert re: hbp/ dm/ hypothyroidism   Chief Complaint  Patient presents with  . Follow-up    Pt uses CPAP 6-7 hours nightly. Denies chest  congestion/tightness, cough, wheezing, and SOB.    skipped am meds on day of ov and admits this happens a lot.  >>  08/29/2014 Follow up : HTN/DM/Hypothyroid/OSA Pt returns for 1 month follow up  Labs last ov with elevated TSH, had run out of thyroid meds prior to labs,  Says has been taking regular now.  Not fasting today, needs lipid panel. We discussed this to be checked on return.   DM showed improved  Control with A1C down at 7.3.  Discussed low sweet diet.   B/p remains elevated Denies NSAID use.  Discussed diet, he can improve on healthier choice, wt loss and low salt intake.  No chest pain, visual/speech , syncope, palpitations. No FH of heart disease.   Wears CPAP everynight, no missed nights.  Feels rested with no daytime sleepiness.      Current Medications, Allergies, Complete Past Medical History, Past Surgical History, Family History, and Social History were reviewed in [employer] [facility] record.  ROS  The following are not active complaints unless bolded sore throat, dysphagia, dental problems, itching, sneezing,  nasal congestion or excess/ purulent secretions, ear ache,   fever, chills, sweats, unintended wt loss, pleuritic or exertional cp, hemoptysis,  orthopnea pnd or leg swelling  better , presyncope, palpitations, heartburn, abdominal pain, anorexia, nausea, vomiting, diarrhea  or change in bowel or urinary habits, change in stools or urine, dysuria,hematuria,  rash, arthralgias, visual complaints, headache, numbness weakness or ataxia or problems with walking or coordination,  change in mood/affect or memory.                  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 Ross  - LHC 08/03/01 nl coronaries and lv fn, lvedp 30  ~Cardiac w/u by Dr Harrington Challenger 5/09 neg cardiolite for ischemia, rec risk reduction  DIVERTICULOSIS colonoscopy 9/01..............................................................Doretha Sou - repeat 11/12/11   Severe OSA- on cpap, sleep study 09/2007,..................................................Marland KitchenDr. 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  09/2007>> set up auto CPAP 5-20 cm, humidifier & nasal pillows  10/2007>>reviewed auto CPAP download 5/29 to 7/5 , poor compliance , pressure 11.4             Objective:   Physical Exam GEN: A/Ox3; pleasant , NAD,  Obese male nad   Wt 07/2010 309   >   Wt 296 06/05/2011 > 290 07/31/2011  wt 303 June 13, 2009>> 295 June 28, 2009 > 303 October 24, 2009 > 300 October 24, 2009 > 309 June 12, 2010 >295 10/01/2011 >  295 08/25/2012 > 04/12/2013  288>08/24/2013 288 > 11/17/2013 290> 08/01/2014   290 >294 08/29/2014    obese amb bm nad  HEENT: nl dentition, turbinates, and orophanx. Nl external ear canals without cough reflex  NECK : without JVD/Nodes/TM/ nl carotid upstrokes bilaterally  LUNGS: no acc muscle use, clear to A and P bilaterally without cough on insp or exp maneuvers  CV: RRR no s3 or murmur or increase in P2, trace bilateral ankle pitting edema  ABD: soft and nontender with nl excursion in the supine position. No bruits or organomegaly, bowel sounds nl  MS: warm without deformities, calf tenderness, cyanosis or clubbing          Labs ordered/ reviewed    Lab 08/01/14 1631  NA 138  K 4.3  CL 103  CO2 31  BUN 15  CREATININE 1.36  GLUCOSE 130*    Lab Results  Component Value Date   HGBA1C 7.3* 08/01/2014   HGBA1C 7.6* 11/17/2013   HGBA1C 7.3* 08/24/2013       Lab Results  Component Value Date   TSH 5.60* 08/01/2014             Assessment & Plan:

## 2014-08-29 NOTE — Assessment & Plan Note (Signed)
Difficult to control HTN on multidrug therapy Case discussed with Dr. Melvyn Novas  -PCP w/ recommendations to increase Minoxidil   Plan  Increase Minoxidil 10mg  in am and 5mg  in pm .  Check blood pressure daily and keep log-bring with you to next office visit.  Wear CPAP At bedtime   Work on weight loss,  Low sweet diet. Low salt diet.  Follow med calendar closely and bring to each visit.  follow up Dr. Melvyn Novas  In 6 weeks  and As needed  -fasting labs.

## 2014-08-29 NOTE — Assessment & Plan Note (Signed)
Good compliance Cont on CPAP  Wt loss

## 2014-08-29 NOTE — Assessment & Plan Note (Signed)
Cont on current regimen  Diet discussed

## 2014-08-29 NOTE — Assessment & Plan Note (Signed)
Return for fasting lipid  Low fat cholesterol diet

## 2014-08-29 NOTE — Patient Instructions (Signed)
Increase Minoxidil 10mg  in am and 5mg  in pm .  Check blood pressure daily and keep log-bring with you to next office visit.  Wear CPAP At bedtime   Work on weight loss,  Low sweet diet. Low salt diet.  Follow med calendar closely and bring to each visit.  follow up Dr. Melvyn Novas  In 6 weeks  and As needed  -fasting labs.

## 2014-09-13 ENCOUNTER — Other Ambulatory Visit: Payer: Self-pay | Admitting: Internal Medicine

## 2014-09-21 ENCOUNTER — Other Ambulatory Visit: Payer: Self-pay | Admitting: Internal Medicine

## 2014-10-11 ENCOUNTER — Ambulatory Visit: Payer: Medicare PPO | Admitting: Internal Medicine

## 2014-10-11 ENCOUNTER — Telehealth: Payer: Self-pay | Admitting: *Deleted

## 2014-10-11 DIAGNOSIS — R0609 Other forms of dyspnea: Principal | ICD-10-CM

## 2014-10-11 NOTE — Telephone Encounter (Signed)
-----   Message from Tanda Rockers, MD sent at 10/11/2014  9:20 AM EDT ----- Please refer him re internal medicine and let him I can see him prn acute problems in the meantime and refill his meds

## 2014-10-11 NOTE — Telephone Encounter (Signed)
LMTCB

## 2014-10-12 NOTE — Telephone Encounter (Signed)
Pt returning call  [phone]

## 2014-10-12 NOTE — Telephone Encounter (Signed)
Called and spoke with pt. Informed him that he had a med calander with TP on 08/29/14 and per her last OV note stated he needed to be scheduled with MW.  follow up Dr. Melvyn Novas In 6 weeks and As needed -fasting labs.   I scheduled pt with MW on 7/18. Pt voiced understanding and had no further questions.

## 2014-10-12 NOTE — Telephone Encounter (Signed)
Patient wants to re-schedule appointment with TP to go over medications. No openings.   Estill Bamberg, are there any slots that we can add this patient to TP's schedule. Patient can be reached at Cell: [phone]  Referral entered for Internal medicine.

## 2014-10-17 ENCOUNTER — Other Ambulatory Visit: Payer: Self-pay | Admitting: Internal Medicine

## 2014-10-24 ENCOUNTER — Ambulatory Visit (INDEPENDENT_AMBULATORY_CARE_PROVIDER_SITE_OTHER): Payer: Medicare PPO | Admitting: Internal Medicine

## 2014-10-24 ENCOUNTER — Encounter: Payer: Self-pay | Admitting: Internal Medicine

## 2014-10-24 ENCOUNTER — Telehealth: Payer: Self-pay | Admitting: Pulmonary Disease

## 2014-10-24 VITALS — BP 154/100 | HR 80 | Ht 71.0 in | Wt 285.6 lb

## 2014-10-24 DIAGNOSIS — E119 Type 2 diabetes mellitus without complications: Secondary | ICD-10-CM | POA: Diagnosis not present

## 2014-10-24 DIAGNOSIS — I10 Essential (primary) hypertension: Secondary | ICD-10-CM | POA: Diagnosis not present

## 2014-10-24 MED ORDER — FUROSEMIDE 20 MG PO TABS: 20.0000 mg | ORAL_TABLET | Freq: Every day | ORAL | Status: DC

## 2014-10-24 NOTE — Progress Notes (Signed)
Subjective:    Patient ID: Matthew Keller, male    DOB: 1950-04-26     MRN: [id number]  Brief patient profile:  55 yobm quit smoking 1987 with morbid obesity complicated by    AODM, HTN,and hyperlipidemia,   & obstructive sleep apnea. 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    History of Present Illness  October 04, 2008 Matthew Keller  Sleep Medicine FU >> dizziness has not recurred, went to [place] for a 9 day trip, took CPAP, using it more than 4h/ night, Epworth Sleepiness Score 5, denies daytime drowsiness or problems driving. Nasal pillows work well, c/o water in tubing sometimes.   June 13, 2009 CPX new doe steps only, x months otherwise no sob.>>no changes, labs     08/24/2013 Follow up -HTN/DM /OSA  Returns for follow up and med review.  We reviewed all his meds and organized them into a med calendar. Appears to be taking correctly .  Says no known low or very high sugars although does not check on reg basis  Encouraged on weight loss and diet issues.  No chest pain or dyspnea.  No skin ulcers or breakdown.  Wears CPAP everynight. No issues with mask  Has follow up with Matthew Keller  Coming up rec Work on weight loss,  Low sweet diet. Low salt diet.  I will call with labs.  Follow med calendar closely and bring to each visit   11/17/2013 f/u ov/Matthew Keller re: hbp/dm/mo / no med calendar  Chief Complaint  Patient presents with  . 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 No HA, vision chnages or other co's.   no drowsiness, am ha, some mild leg swelling and admits to some discretion with dietary salt  rec Pneumovax today and prevnar at age 64  Increase Minoxidil 2.5 take 2 in am and 2 in pm as per calendar Weight control is simply a matter of calorie balance> reviewed    08/29/2014 Follow up : HTN/DM/Hypothyroid/OSA Pt returns for 1 month follow up  Labs last ov with elevated TSH, had run out of thyroid meds prior to labs,   Says has been taking regular now.  Not fasting today, needs lipid panel. We discussed this to be checked on return.  DM showed improved  Control with A1C down at 7.3.  Discussed low sweet diet.  B/p remains elevated Denies NSAID use.  Discussed diet, he can improve on healthier choice, wt loss and low salt intake.  No chest pain, visual/speech , syncope, palpitations. No FH of heart disease.  Wears CPAP everynight, no missed nights.  Feels rested with no daytime sleepiness.  rec Increase Minoxidil 10mg  in am and 5mg  in pm .  Check blood pressure daily and keep log-bring with you to next office visit.  Wear CPAP At bedtime   Work on weight loss,  Low sweet diet. Low salt diet.  Follow med calendar closely and bring to each visit.     10/24/2014 f/u ov/Matthew Keller re: obesity complicated by dm / hbp ? Compliance - did not bring med calendar  Chief Complaint  Patient presents with  . Follow-up    Fasting this morning for labs.  hypertension, diabetes.    walking daily x 30 min with mild sob no cp or claudication  ? Has pill organizer (hard to tell by talking to him)   No obvious day to  day or daytime variability or assoc chronic cough or cp or chest tightness, subjective wheeze or overt sinus or hb symptoms. No unusual exp hx or h/o childhood pna/ asthma or knowledge of premature birth.  Sleeping ok without nocturnal  or early am exacerbation  of respiratory  c/o's or need for noct saba. Also denies any obvious fluctuation of symptoms with weather or environmental changes or other aggravating or alleviating factors except as outlined above   Current Medications, Allergies, Complete Past Medical History, Past Surgical History, Family History, and Social History were reviewed in [employer] record.  ROS  The following are not active complaints unless bolded sore throat, dysphagia, dental problems, itching, sneezing,  nasal congestion or excess/ purulent secretions, ear  ache,   fever, chills, sweats, unintended wt loss, classically pleuritic or exertional cp, hemoptysis,  orthopnea pnd or leg swelling mild bilateral , presyncope, palpitations, abdominal pain, anorexia, nausea, vomiting, diarrhea  or change in bowel or bladder habits, change in stools or urine, dysuria,hematuria,  rash, arthralgias, visual complaints, headache, numbness, weakness or ataxia or problems with walking or coordination,  change in mood/affect or memory.           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)  HYPOTHYROIDISM, BORDERLINE (ICD-244.9)  HYPERTENSION (ICD-401.9)  MORBID OBESITY (ICD-278.01)  - All time high 310 2009  - Target wt = 208 for BMI < 30  - Referred back to nutrition again June 12, 2010  Atypical CP........................................................................................................Matthew Keller  - LHC 08/03/01 nl coronaries and lv fn, lvedp 30  ~Cardiac w/u by Dr Harrington Challenger 5/09 neg cardiolite for ischemia, rec risk reduction  DIVERTICULOSIS colonoscopy 9/01..............................................................Matthew Keller - repeat 11/12/11   Severe OSA- on cpap, sleep study 09/2007,..................................................Marland KitchenDr. 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  09/2007>> set up auto CPAP 5-20 cm, humidifier & nasal pillows  10/2007>>reviewed auto CPAP download 5/29 to 7/5 , poor compliance , pressure 11.4             Objective:   Physical Exam GEN: A/Ox3; pleasant , NAD,  Obese male nad   Wt  07/2010 309   >   Wt 296 06/05/2011 > 290 07/31/2011  wt 303 June 13, 2009>> 295 June 28, 2009 > 303 October 24, 2009 > 300 October 24, 2009 > 309 June 12, 2010 >295 10/01/2011 >  295 08/25/2012 > 04/12/2013  288>08/24/2013 288 > 11/17/2013 290> 08/01/2014   290 >294 08/29/2014 >  10/24/2014   286    obese amb bm nad  HEENT: nl dentition, turbinates, and orophanx. Nl external ear canals without cough reflex  NECK : without JVD/Nodes/TM/ nl carotid upstrokes bilaterally  LUNGS: no acc muscle use, clear to A and P bilaterally without cough on insp or exp maneuvers  CV: RRR no s3 or murmur or increase in P2, trace bilateral ankle pitting edema  ABD: soft and nontender with nl excursion in the supine position. No bruits or organomegaly, bowel sounds nl  MS: warm without deformities, calf tenderness, cyanosis or clubbing         Labs  reviewed    Lab 08/01/14 1631  NA 138  K 4.3  CL 103  CO2 31  BUN 15  CREATININE 1.36  GLUCOSE 130*    Lab Results  Component Value Date   HGBA1C 7.3* 08/01/2014   HGBA1C 7.6* 11/17/2013   HGBA1C 7.3* 08/24/2013       Lab Results  Component Value Date   TSH 5.60* 08/01/2014             Assessment & Plan:   Outpatient Encounter Prescriptions as of 10/24/2014  Medication Sig  . acetaminophen (TYLENOL) 325 MG tablet Per bottle as needed   . glimepiride (AMARYL) 2 MG tablet TAKE 1 TABLET BY MOUTH EVERY DAY  . levothyroxine (SYNTHROID, LEVOTHROID) 75 MCG tablet TAKE 1 TABLET BY MOUTH DAILY.  . metFORMIN (GLUCOPHAGE) 500 MG tablet TAKE 1 TABLET (500 MG TOTAL) BY MOUTH 2 (TWO) TIMES DAILY WITH A MEAL.  . minoxidil (LONITEN) 10 MG tablet 1 tablet in am and 1/2 tablet in pm  . omeprazole (PRILOSEC) 20 MG capsule TAKE ONE CAPSULE BY MOUTH DAILY  . sulindac (CLINORIL) 200 MG tablet Take 1 tablet (200 mg total) by mouth 2 (two) times daily as needed.  . valsartan-hydrochlorothiazide (DIOVAN-HCT) 320-25 MG per tablet TAKE 1 TABLET BY MOUTH DAILY  . verapamil  (CALAN-SR) 240 MG CR tablet TAKE 1 TABLET BY MOUTH ONCE A DAY  . furosemide (LASIX) 20 MG tablet Take 1 tablet (20 mg total) by mouth daily.   No facility-administered encounter medications on file as of 10/24/2014.

## 2014-10-24 NOTE — Assessment & Plan Note (Signed)
Lab Results  Component Value Date   HGBA1C 7.3* 08/01/2014   HGBA1C 7.6* 11/17/2013   HGBA1C 7.3* 08/24/2013    Not Adequate control on present rx, reviewed > no change in rx needed  Pending primary care eval

## 2014-10-24 NOTE — Telephone Encounter (Signed)
Spoke with pt, states that he saw MW this morning- has to switch to [place] from [facility] d/t insurance.  Back in April a new cpap was ordered but pt has not heard anything about this.  Pt has not tried to contact [facility].  Spoke with April at [place], states that [facility] team has been trying to reach patient to verify order.  Pt needs to call [facility] team at [phone].  Spoke with pt and gave number to call.  Nothing further needed at this time.

## 2014-10-24 NOTE — Assessment & Plan Note (Signed)
Not Adequate control on present rx, reviewed >  Add lasix 20 mg daily and f/u with primary care as planned

## 2014-10-24 NOTE — Assessment & Plan Note (Signed)
MORBID OBESITY (ICD-278.01) - All time high 310 2009 - Target wt = 208 for BMI < 30 - Referred back to nutrition again June 12, 2010     Body mass index is 39.85 kg/(m^2).  Lab Results  Component Value Date   TSH 5.60* 08/01/2014     Contributing to hbp/ doe/ dm needs to achieve and maintain neg calorie balance > see discussion >>f/u primary care     I had an extended final summary discussion with the patient reviewing all relevant studies completed to date and  lasting 15 to 20 minutes of a 25 minute visit on the following issues:      Each maintenance medication was reviewed in detail including most importantly the difference between maintenance and as needed and under what circumstances the prns are to be used. This was done in the context of a medication calendar review which provided the patient with a user-friendly unambiguous mechanism for medication administration and reconciliation and provides an action plan for all active problems. It is critical that this be shown to every doctor  for modification during the office visit if necessary so the patient can use it as a working document.

## 2014-10-24 NOTE — Patient Instructions (Signed)
Add furosemide 20 mg each am   Weight control is simply a matter of calorie balance which needs to be tilted in your favor by eating less and exercising more.  To get the most out of exercise, you need to be continuously aware that you are short of breath, but never out of breath, for 30 minutes daily. As you improve, it will actually be easier for you to do the same amount of exercise  in  30 minutes so always push to the level where you are short of breath.  If this does not result in gradual weight reduction then I strongly recommend you see a nutritionist with a food diary x 2 weeks so that we can work out a negative calorie balance which is universally effective in steady weight loss programs.  Think of your calorie balance like you do your bank account where in this case you want the balance to go down so you must take in less calories than you burn up.  It's just that simple:  Hard to do, but easy to understand.  Good luck!   See calendar for specific medication instructions and bring it back for each and every office visit for every healthcare provider you see.  Without it,  you may not receive the best quality medical care that we feel you deserve.  You will note that the calendar groups together  your maintenance  medications that are timed at particular times of the day.  Think of this as your checklist for what your doctor has instructed you to do until your next evaluation to see what benefit  there is  to staying on a consistent group of medications intended to keep you well.  The other group at the bottom is entirely up to you to use as you see fit  for specific symptoms that may arise between visits that require you to treat them on an as needed basis.  Think of this as your action plan or "what if" list.   Separating the top medications from the bottom group is fundamental to providing you adequate care going forward.    Follow up on this floor is with Dr [name] but I would be happy to see  you in the future for any breathing/ coughing problems

## 2014-10-29 ENCOUNTER — Other Ambulatory Visit: Payer: Self-pay | Admitting: Internal Medicine

## 2014-10-31 ENCOUNTER — Ambulatory Visit: Payer: Medicare PPO | Admitting: Family

## 2014-11-07 ENCOUNTER — Ambulatory Visit (INDEPENDENT_AMBULATORY_CARE_PROVIDER_SITE_OTHER): Payer: Medicare PPO | Admitting: Family

## 2014-11-07 ENCOUNTER — Other Ambulatory Visit (INDEPENDENT_AMBULATORY_CARE_PROVIDER_SITE_OTHER): Payer: Medicare PPO

## 2014-11-07 ENCOUNTER — Encounter: Payer: Self-pay | Admitting: Family

## 2014-11-07 VITALS — BP 140/78 | HR 82 | Temp 98.0°F | Resp 18 | Ht 71.0 in | Wt 283.8 lb

## 2014-11-07 DIAGNOSIS — Z0000 Encounter for general adult medical examination without abnormal findings: Secondary | ICD-10-CM

## 2014-11-07 DIAGNOSIS — M5442 Lumbago with sciatica, left side: Secondary | ICD-10-CM | POA: Diagnosis not present

## 2014-11-07 DIAGNOSIS — M545 Low back pain, unspecified: Secondary | ICD-10-CM | POA: Insufficient documentation

## 2014-11-07 DIAGNOSIS — Z125 Encounter for screening for malignant neoplasm of prostate: Secondary | ICD-10-CM | POA: Diagnosis not present

## 2014-11-07 DIAGNOSIS — Z23 Encounter for immunization: Secondary | ICD-10-CM | POA: Diagnosis not present

## 2014-11-07 DIAGNOSIS — E119 Type 2 diabetes mellitus without complications: Secondary | ICD-10-CM | POA: Diagnosis not present

## 2014-11-07 DIAGNOSIS — I10 Essential (primary) hypertension: Secondary | ICD-10-CM | POA: Diagnosis not present

## 2014-11-07 DIAGNOSIS — Z0001 Encounter for general adult medical examination with abnormal findings: Secondary | ICD-10-CM | POA: Insufficient documentation

## 2014-11-07 LAB — LIPID PANEL
Cholesterol: 170 mg/dL (ref 0–200)
HDL: 43.5 mg/dL (ref >39.00)
LDL Cholesterol: 109 mg/dL — ABNORMAL HIGH (ref 0–99)
NonHDL: 126.04
TRIGLYCERIDES: 87 mg/dL (ref 0.0–149.0)
Total CHOL/HDL Ratio: 4
VLDL: 17.4 mg/dL (ref 0.0–40.0)

## 2014-11-07 LAB — CBC
HEMATOCRIT: 38.5 % — AB (ref 39.0–52.0)
Hemoglobin: 12.9 g/dL — ABNORMAL LOW (ref 13.0–17.0)
MCHC: 33.4 g/dL (ref 30.0–36.0)
MCV: 75.6 fl — AB (ref 78.0–100.0)
PLATELETS: 243 10*3/uL (ref 150.0–400.0)
RBC: 5.09 Mil/uL (ref 4.22–5.81)
RDW: 15.6 % — AB (ref 11.5–15.5)
WBC: 8.1 10*3/uL (ref 4.0–10.5)

## 2014-11-07 LAB — BASIC METABOLIC PANEL
BUN: 26 mg/dL — ABNORMAL HIGH (ref 6–23)
CO2: 30 meq/L (ref 19–32)
Calcium: 10.4 mg/dL (ref 8.4–10.5)
Chloride: 102 mEq/L (ref 96–112)
Creatinine, Ser: 1.57 mg/dL — ABNORMAL HIGH (ref 0.40–1.50)
GFR: 57.19 mL/min — ABNORMAL LOW (ref >60.00)
Glucose, Bld: 126 mg/dL — ABNORMAL HIGH (ref 70–99)
Potassium: 4.4 mEq/L (ref 3.5–5.1)
Sodium: 141 mEq/L (ref 135–145)

## 2014-11-07 LAB — PSA: PSA: 0.29 ng/mL (ref 0.10–4.00)

## 2014-11-07 LAB — TSH: TSH: 2.63 u[IU]/mL (ref 0.35–4.50)

## 2014-11-07 NOTE — Progress Notes (Signed)
Pre visit review using our clinic review tool, if applicable. No additional management support is needed unless otherwise documented below in the visit note. 

## 2014-11-07 NOTE — Patient Instructions (Signed)
Thank you for choosing [facility].  Summary/Instructions:  Please stop by the lab on the basement level of the building for your blood work. Your results will be released to [place] (or called to you) after review, usually within 72 hours after test completion. If any changes need to be made, you will be notified at that same time.  Please stop by radiology on the basement level of the building for your x-rays. Your results will be released to [place] (or called to you) after review, usually within 72 hours after test completion. If any treatments or changes are necessary, you will be notified at that same time.  If your symptoms worsen or fail to improve, please contact our office for further instruction, or in case of emergency go directly to the emergency room at the closest medical facility.    Health Maintenance A healthy lifestyle and preventative care can promote health and wellness.  Maintain regular health, dental, and eye exams.  Eat a healthy diet. Foods like vegetables, fruits, whole grains, low-fat dairy products, and lean protein foods contain the nutrients you need and are low in calories. Decrease your intake of foods high in solid fats, added sugars, and salt. Get information about a proper diet from your health care provider, if necessary.  Regular physical exercise is one of the most important things you can do for your health. Most adults should get at least 150 minutes of moderate-intensity exercise (any activity that increases your heart rate and causes you to sweat) each week. In addition, most adults need muscle-strengthening exercises on 2 or more days a week.   Maintain a healthy weight. The body mass index (BMI) is a screening tool to identify possible weight problems. It provides an estimate of body fat based on height and weight. Your health care provider can find your BMI and can help you achieve or maintain a healthy weight. For males 20 years and  older:  A BMI below 18.5 is considered underweight.  A BMI of 18.5 to 24.9 is normal.  A BMI of 25 to 29.9 is considered overweight.  A BMI of 30 and above is considered obese.  Maintain normal blood lipids and cholesterol by exercising and minimizing your intake of saturated fat. Eat a balanced diet with plenty of fruits and vegetables. Blood tests for lipids and cholesterol should begin at age 2 and be repeated every 5 years. If your lipid or cholesterol levels are high, you are over age 40, or you are at high risk for heart disease, you may need your cholesterol levels checked more frequently.Ongoing high lipid and cholesterol levels should be treated with medicines if diet and exercise are not working.  If you smoke, find out from your health care provider how to quit. If you do not use tobacco, do not start.  Lung cancer screening is recommended for adults aged 54-80 years who are at high risk for developing lung cancer because of a history of smoking. A yearly low-dose CT scan of the lungs is recommended for people who have at least a 30-pack-year history of smoking and are current smokers or have quit within the past 15 years. A pack year of smoking is smoking an average of 1 pack of cigarettes a day for 1 year (for example, a 30-pack-year history of smoking could mean smoking 1 pack a day for 30 years or 2 packs a day for 15 years). Yearly screening should continue until the smoker has stopped smoking for at least  15 years. Yearly screening should be stopped for people who develop a health problem that would prevent them from having lung cancer treatment.  If you choose to drink alcohol, do not have more than 2 drinks per day. One drink is considered to be 12 oz (360 mL) of beer, 5 oz (150 mL) of wine, or 1.5 oz (45 mL) of liquor.  Avoid the use of street drugs. Do not share needles with anyone. Ask for help if you need support or instructions about stopping the use of drugs.  High  blood pressure causes heart disease and increases the risk of stroke. Blood pressure should be checked at least every 1-2 years. Ongoing high blood pressure should be treated with medicines if weight loss and exercise are not effective.  If you are 51-43 years old, ask your health care provider if you should take aspirin to prevent heart disease.  Diabetes screening involves taking a blood sample to check your fasting blood sugar level. This should be done once every 3 years after age 8 if you are at a normal weight and without risk factors for diabetes. Testing should be considered at a younger age or be carried out more frequently if you are overweight and have at least 1 risk factor for diabetes.  Colorectal cancer can be detected and often prevented. Most routine colorectal cancer screening begins at the age of 51 and continues through age 38. However, your health care provider may recommend screening at an earlier age if you have risk factors for colon cancer. On a yearly basis, your health care provider may provide home test kits to check for hidden blood in the stool. A small camera at the end of a tube may be used to directly examine the colon (sigmoidoscopy or colonoscopy) to detect the earliest forms of colorectal cancer. Talk to your health care provider about this at age 50 when routine screening begins. A direct exam of the colon should be repeated every 5-10 years through age 28, unless early forms of precancerous polyps or small growths are found.  People who are at an increased risk for hepatitis B should be screened for this virus. You are considered at high risk for hepatitis B if:  You were born in a country where hepatitis B occurs often. Talk with your health care provider about which countries are considered high risk.  Your parents were born in a high-risk country and you have not received a shot to protect against hepatitis B (hepatitis B vaccine).  You have HIV or AIDS.  You  use needles to inject street drugs.  You live with, or have sex with, someone who has hepatitis B.  You are a man who has sex with other men (MSM).  You get hemodialysis treatment.  You take certain medicines for conditions like cancer, organ transplantation, and autoimmune conditions.  Hepatitis C blood testing is recommended for all people born from [birth year] through 1967 and any individual with known risk factors for hepatitis C.  Healthy men should no longer receive prostate-specific antigen (PSA) blood tests as part of routine cancer screening. Talk to your health care provider about prostate cancer screening.  Testicular cancer screening is not recommended for adolescents or adult males who have no symptoms. Screening includes self-exam, a health care provider exam, and other screening tests. Consult with your health care provider about any symptoms you have or any concerns you have about testicular cancer.  Practice safe sex. Use condoms and avoid high-risk sexual  practices to reduce the spread of sexually transmitted infections (STIs).  You should be screened for STIs, including gonorrhea and chlamydia if:  You are sexually active and are younger than 24 years.  You are older than 24 years, and your health care provider tells you that you are at risk for this type of infection.  Your sexual activity has changed since you were last screened, and you are at an increased risk for chlamydia or gonorrhea. Ask your health care provider if you are at risk.  If you are at risk of being infected with HIV, it is recommended that you take a prescription medicine daily to prevent HIV infection. This is called pre-exposure prophylaxis (PrEP). You are considered at risk if:  You are a man who has sex with other men (MSM).  You are a heterosexual man who is sexually active with multiple partners.  You take drugs by injection.  You are sexually active with a partner who has HIV.  Talk with  your health care provider about whether you are at high risk of being infected with HIV. If you choose to begin PrEP, you should first be tested for HIV. You should then be tested every 3 months for as long as you are taking PrEP.  Use sunscreen. Apply sunscreen liberally and repeatedly throughout the day. You should seek shade when your shadow is shorter than you. Protect yourself by wearing long sleeves, pants, a wide-brimmed hat, and sunglasses year round whenever you are outdoors.  Tell your health care provider of new moles or changes in moles, especially if there is a change in shape or color. Also, tell your health care provider if a mole is larger than the size of a pencil eraser.  A one-time screening for abdominal aortic aneurysm (AAA) and surgical repair of large AAAs by ultrasound is recommended for men aged 48-75 years who are current or former smokers.  Stay current with your vaccines (immunizations). Document Released: 09/21/2007 Document Revised: 03/30/2013 Document Reviewed: 08/20/2010 [facility] Patient Information 2015 [place], [place]. This information is not intended to replace advice given to you by your health care provider. Make sure you discuss any questions you have with your health care provider.  Low Back Strain with Rehab A strain is an injury in which a tendon or muscle is torn. The muscles and tendons of the lower back are vulnerable to strains. However, these muscles and tendons are very strong and require a great force to be injured. Strains are classified into three categories. Grade 1 strains cause pain, but the tendon is not lengthened. Grade 2 strains include a lengthened ligament, due to the ligament being stretched or partially ruptured. With grade 2 strains there is still function, although the function may be decreased. Grade 3 strains involve a complete tear of the tendon or muscle, and function is usually impaired. SYMPTOMS   Pain in the lower back.  Pain  that affects one side more than the other.  Pain that gets worse with movement and may be felt in the hip, buttocks, or back of the thigh.  Muscle spasms of the muscles in the back.  Swelling along the muscles of the back.  Loss of strength of the back muscles.  Crackling sound (crepitation) when the muscles are touched. CAUSES  Lower back strains occur when a force is placed on the muscles or tendons that is greater than they can handle. Common causes of injury include:  Prolonged overuse of the muscle-tendon units in the  lower back, usually from incorrect posture.  A single violent injury or force applied to the back. RISK INCREASES WITH:  Sports that involve twisting forces on the spine or a lot of bending at the waist (football, rugby, weightlifting, bowling, golf, tennis, speed skating, racquetball, swimming, running, gymnastics, diving).  Poor strength and flexibility.  Failure to warm up properly before activity.  Family history of lower back pain or disk disorders.  Previous back injury or surgery (especially fusion).  Poor posture with lifting, especially heavy objects.  Prolonged sitting, especially with poor posture. PREVENTION   Learn and use proper posture when sitting or lifting (maintain proper posture when sitting, lift using the knees and legs, not at the waist).  Warm up and stretch properly before activity.  Allow for adequate recovery between workouts.  Maintain physical fitness:  Strength, flexibility, and endurance.  Cardiovascular fitness. PROGNOSIS  If treated properly, lower back strains usually heal within 6 weeks. RELATED COMPLICATIONS   Recurring symptoms, resulting in a chronic problem.  Chronic inflammation, scarring, and partial muscle-tendon tear.  Delayed healing or resolution of symptoms.  Prolonged disability. TREATMENT  Treatment first involves the use of ice and medicine, to reduce pain and inflammation. The use of  strengthening and stretching exercises may help reduce pain with activity. These exercises may be performed at home or with a therapist. Severe injuries may require referral to a therapist for further evaluation and treatment, such as ultrasound. Your caregiver may advise that you wear a back brace or corset, to help reduce pain and discomfort. Often, prolonged bed rest results in greater harm then benefit. Corticosteroid injections may be recommended. However, these should be reserved for the most serious cases. It is important to avoid using your back when lifting objects. At night, sleep on your back on a firm mattress with a pillow placed under your knees. If non-surgical treatment is unsuccessful, surgery may be needed.  MEDICATION   If pain medicine is needed, nonsteroidal anti-inflammatory medicines (aspirin and ibuprofen), or other minor pain relievers (acetaminophen), are often advised.  Do not take pain medicine for 7 days before surgery.  Prescription pain relievers may be given, if your caregiver thinks they are needed. Use only as directed and only as much as you need.  Ointments applied to the skin may be helpful.  Corticosteroid injections may be given by your caregiver. These injections should be reserved for the most serious cases, because they may only be given a certain number of times. HEAT AND COLD  Cold treatment (icing) should be applied for 10 to 15 minutes every 2 to 3 hours for inflammation and pain, and immediately after activity that aggravates your symptoms. Use ice packs or an ice massage.  Heat treatment may be used before performing stretching and strengthening activities prescribed by your caregiver, physical therapist, or athletic trainer. Use a heat pack or a warm water soak. SEEK MEDICAL CARE IF:   Symptoms get worse or do not improve in 2 to 4 weeks, despite treatment.  You develop numbness, weakness, or loss of bowel or bladder function.  New, unexplained  symptoms develop. (Drugs used in treatment may produce side effects.) EXERCISES  RANGE OF MOTION (ROM) AND STRETCHING EXERCISES - Low Back Strain Most people with lower back pain will find that their symptoms get worse with excessive bending forward (flexion) or arching at the lower back (extension). The exercises which will help resolve your symptoms will focus on the opposite motion.  Your physician, physical therapist  or athletic trainer will help you determine which exercises will be most helpful to resolve your lower back pain. Do not complete any exercises without first consulting with your caregiver. Discontinue any exercises which make your symptoms worse until you speak to your caregiver.  If you have pain, numbness or tingling which travels down into your buttocks, leg or foot, the goal of the therapy is for these symptoms to move closer to your back and eventually resolve. Sometimes, these leg symptoms will get better, but your lower back pain may worsen. This is typically an indication of progress in your rehabilitation. Be very alert to any changes in your symptoms and the activities in which you participated in the 24 hours prior to the change. Sharing this information with your caregiver will allow him/her to most efficiently treat your condition.  These exercises may help you when beginning to rehabilitate your injury. Your symptoms may resolve with or without further involvement from your physician, physical therapist or athletic trainer. While completing these exercises, remember:  Restoring tissue flexibility helps normal motion to return to the joints. This allows healthier, less painful movement and activity.  An effective stretch should be held for at least 30 seconds.  A stretch should never be painful. You should only feel a gentle lengthening or release in the stretched tissue. FLEXION RANGE OF MOTION AND STRETCHING EXERCISES: STRETCH - Flexion, Single Knee to Chest   Lie on  a firm bed or floor with both legs extended in front of you.  Keeping one leg in contact with the floor, bring your opposite knee to your chest. Hold your leg in place by either grabbing behind your thigh or at your knee.  Pull until you feel a gentle stretch in your lower back. Hold __________ seconds.  Slowly release your grasp and repeat the exercise with the opposite side. Repeat __________ times. Complete this exercise __________ times per day.  STRETCH - Flexion, Double Knee to Chest   Lie on a firm bed or floor with both legs extended in front of you.  Keeping one leg in contact with the floor, bring your opposite knee to your chest.  Tense your stomach muscles to support your back and then lift your other knee to your chest. Hold your legs in place by either grabbing behind your thighs or at your knees.  Pull both knees toward your chest until you feel a gentle stretch in your lower back. Hold __________ seconds.  Tense your stomach muscles and slowly return one leg at a time to the floor. Repeat __________ times. Complete this exercise __________ times per day.  STRETCH - Low Trunk Rotation  Lie on a firm bed or floor. Keeping your legs in front of you, bend your knees so they are both pointed toward the ceiling and your feet are flat on the floor.  Extend your arms out to the side. This will stabilize your upper body by keeping your shoulders in contact with the floor.  Gently and slowly drop both knees together to one side until you feel a gentle stretch in your lower back. Hold for __________ seconds.  Tense your stomach muscles to support your lower back as you bring your knees back to the starting position. Repeat the exercise to the other side. Repeat __________ times. Complete this exercise __________ times per day  EXTENSION RANGE OF MOTION AND FLEXIBILITY EXERCISES: STRETCH - Extension, Prone on Elbows   Lie on your stomach on the floor, a bed will  be too soft.  Place your palms about shoulder width apart and at the height of your head.  Place your elbows under your shoulders. If this is too painful, stack pillows under your chest.  Allow your body to relax so that your hips drop lower and make contact more completely with the floor.  Hold this position for __________ seconds.  Slowly return to lying flat on the floor. Repeat __________ times. Complete this exercise __________ times per day.  RANGE OF MOTION - Extension, Prone Press Ups  Lie on your stomach on the floor, a bed will be too soft. Place your palms about shoulder width apart and at the height of your head.  Keeping your back as relaxed as possible, slowly straighten your elbows while keeping your hips on the floor. You may adjust the placement of your hands to maximize your comfort. As you gain motion, your hands will come more underneath your shoulders.  Hold this position __________ seconds.  Slowly return to lying flat on the floor. Repeat __________ times. Complete this exercise __________ times per day.  RANGE OF MOTION- Quadruped, Neutral Spine   Assume a hands and knees position on a firm surface. Keep your hands under your shoulders and your knees under your hips. You may place padding under your knees for comfort.  Drop your head and point your tail bone toward the ground below you. This will round out your lower back like an angry cat. Hold this position for __________ seconds.  Slowly lift your head and release your tail bone so that your back sags into a large arch, like an old horse.  Hold this position for __________ seconds.  Repeat this until you feel limber in your lower back.  Now, find your "sweet spot." This will be the most comfortable position somewhere between the two previous positions. This is your neutral spine. Once you have found this position, tense your stomach muscles to support your lower back.  Hold this position for __________ seconds. Repeat  __________ times. Complete this exercise __________ times per day.  STRENGTHENING EXERCISES - Low Back Strain These exercises may help you when beginning to rehabilitate your injury. These exercises should be done near your "sweet spot." This is the neutral, low-back arch, somewhere between fully rounded and fully arched, that is your least painful position. When performed in this safe range of motion, these exercises can be used for people who have either a flexion or extension based injury. These exercises may resolve your symptoms with or without further involvement from your physician, physical therapist or athletic trainer. While completing these exercises, remember:   Muscles can gain both the endurance and the strength needed for everyday activities through controlled exercises.  Complete these exercises as instructed by your physician, physical therapist or athletic trainer. Increase the resistance and repetitions only as guided.  You may experience muscle soreness or fatigue, but the pain or discomfort you are trying to eliminate should never worsen during these exercises. If this pain does worsen, stop and make certain you are following the directions exactly. If the pain is still present after adjustments, discontinue the exercise until you can discuss the trouble with your caregiver. STRENGTHENING - Deep Abdominals, Pelvic Tilt  Lie on a firm bed or floor. Keeping your legs in front of you, bend your knees so they are both pointed toward the ceiling and your feet are flat on the floor.  Tense your lower abdominal muscles to press your lower back into  the floor. This motion will rotate your pelvis so that your tail bone is scooping upwards rather than pointing at your feet or into the floor.  With a gentle tension and even breathing, hold this position for __________ seconds. Repeat __________ times. Complete this exercise __________ times per day.  STRENGTHENING - Abdominals, Crunches    Lie on a firm bed or floor. Keeping your legs in front of you, bend your knees so they are both pointed toward the ceiling and your feet are flat on the floor. Cross your arms over your chest.  Slightly tip your chin down without bending your neck.  Tense your abdominals and slowly lift your trunk high enough to just clear your shoulder blades. Lifting higher can put excessive stress on the lower back and does not further strengthen your abdominal muscles.  Control your return to the starting position. Repeat __________ times. Complete this exercise __________ times per day.  STRENGTHENING - Quadruped, Opposite UE/LE Lift   Assume a hands and knees position on a firm surface. Keep your hands under your shoulders and your knees under your hips. You may place padding under your knees for comfort.  Find your neutral spine and gently tense your abdominal muscles so that you can maintain this position. Your shoulders and hips should form a rectangle that is parallel with the floor and is not twisted.  Keeping your trunk steady, lift your right hand no higher than your shoulder and then your left leg no higher than your hip. Make sure you are not holding your breath. Hold this position __________ seconds.  Continuing to keep your abdominal muscles tense and your back steady, slowly return to your starting position. Repeat with the opposite arm and leg. Repeat __________ times. Complete this exercise __________ times per day.  STRENGTHENING - Lower Abdominals, Double Knee Lift  Lie on a firm bed or floor. Keeping your legs in front of you, bend your knees so they are both pointed toward the ceiling and your feet are flat on the floor.  Tense your abdominal muscles to brace your lower back and slowly lift both of your knees until they come over your hips. Be certain not to hold your breath.  Hold __________ seconds. Using your abdominal muscles, return to the starting position in a slow and  controlled manner. Repeat __________ times. Complete this exercise __________ times per day.  POSTURE AND BODY MECHANICS CONSIDERATIONS - Low Back Strain Keeping correct posture when sitting, standing or completing your activities will reduce the stress put on different body tissues, allowing injured tissues a chance to heal and limiting painful experiences. The following are general guidelines for improved posture. Your physician or physical therapist will provide you with any instructions specific to your needs. While reading these guidelines, remember:  The exercises prescribed by your provider will help you have the flexibility and strength to maintain correct postures.  The correct posture provides the best environment for your joints to work. All of your joints have less wear and tear when properly supported by a spine with good posture. This means you will experience a healthier, less painful body.  Correct posture must be practiced with all of your activities, especially prolonged sitting and standing. Correct posture is as important when doing repetitive low-stress activities (typing) as it is when doing a single heavy-load activity (lifting). RESTING POSITIONS Consider which positions are most painful for you when choosing a resting position. If you have pain with flexion-based activities (sitting, bending, stooping,  squatting), choose a position that allows you to rest in a less flexed posture. You would want to avoid curling into a fetal position on your side. If your pain worsens with extension-based activities (prolonged standing, working overhead), avoid resting in an extended position such as sleeping on your stomach. Most people will find more comfort when they rest with their spine in a more neutral position, neither too rounded nor too arched. Lying on a non-sagging bed on your side with a pillow between your knees, or on your back with a pillow under your knees will often provide some  relief. Keep in mind, being in any one position for a prolonged period of time, no matter how correct your posture, can still lead to stiffness. PROPER SITTING POSTURE In order to minimize stress and discomfort on your spine, you must sit with correct posture. Sitting with good posture should be effortless for a healthy body. Returning to good posture is a gradual process. Many people can work toward this most comfortably by using various supports until they have the flexibility and strength to maintain this posture on their own. When sitting with proper posture, your ears will fall over your shoulders and your shoulders will fall over your hips. You should use the back of the chair to support your upper back. Your lower back will be in a neutral position, just slightly arched. You may place a small pillow or folded towel at the base of your lower back for support.  When working at a desk, create an environment that supports good, upright posture. Without extra support, muscles tire, which leads to excessive strain on joints and other tissues. Keep these recommendations in mind: CHAIR:  A chair should be able to slide under your desk when your back makes contact with the back of the chair. This allows you to work closely.  The chair's height should allow your eyes to be level with the upper part of your monitor and your hands to be slightly lower than your elbows. BODY POSITION  Your feet should make contact with the floor. If this is not possible, use a foot rest.  Keep your ears over your shoulders. This will reduce stress on your neck and lower back. INCORRECT SITTING POSTURES  If you are feeling tired and unable to assume a healthy sitting posture, do not slouch or slump. This puts excessive strain on your back tissues, causing more damage and pain. Healthier options include:  Using more support, like a lumbar pillow.  Switching tasks to something that requires you to be upright or  walking.  Talking a brief walk.  Lying down to rest in a neutral-spine position. PROLONGED STANDING WHILE SLIGHTLY LEANING FORWARD  When completing a task that requires you to lean forward while standing in one place for a long time, place either foot up on a stationary 2-4 inch high object to help maintain the best posture. When both feet are on the ground, the lower back tends to lose its slight inward curve. If this curve flattens (or becomes too large), then the back and your other joints will experience too much stress, tire more quickly, and can cause pain. CORRECT STANDING POSTURES Proper standing posture should be assumed with all daily activities, even if they only take a few moments, like when brushing your teeth. As in sitting, your ears should fall over your shoulders and your shoulders should fall over your hips. You should keep a slight tension in your abdominal muscles to brace  your spine. Your tailbone should point down to the ground, not behind your body, resulting in an over-extended swayback posture.  INCORRECT STANDING POSTURES  Common incorrect standing postures include a forward head, locked knees and/or an excessive swayback. WALKING Walk with an upright posture. Your ears, shoulders and hips should all line-up. PROLONGED ACTIVITY IN A FLEXED POSITION When completing a task that requires you to bend forward at your waist or lean over a low surface, try to find a way to stabilize 3 out of 4 of your limbs. You can place a hand or elbow on your thigh or rest a knee on the surface you are reaching across. This will provide you more stability so that your muscles do not fatigue as quickly. By keeping your knees relaxed, or slightly bent, you will also reduce stress across your lower back. CORRECT LIFTING TECHNIQUES DO :   Assume a wide stance. This will provide you more stability and the opportunity to get as close as possible to the object which you are lifting.  Tense your  abdominals to brace your spine. Bend at the knees and hips. Keeping your back locked in a neutral-spine position, lift using your leg muscles. Lift with your legs, keeping your back straight.  Test the weight of unknown objects before attempting to lift them.  Try to keep your elbows locked down at your sides in order get the best strength from your shoulders when carrying an object.  Always ask for help when lifting heavy or awkward objects. INCORRECT LIFTING TECHNIQUES DO NOT:   Lock your knees when lifting, even if it is a small object.  Bend and twist. Pivot at your feet or move your feet when needing to change directions.  Assume that you can safely pick up even a paper clip without proper posture. Document Released: 03/25/2005 Document Revised: 06/17/2011 Document Reviewed: 07/07/2008 [facility] Patient Information 2015 [place], [place]. This information is not intended to replace advice given to you by your health care provider. Make sure you discuss any questions you have with your health care provider.

## 2014-11-07 NOTE — Assessment & Plan Note (Signed)
1) Anticipatory Guidance: Discussed importance of wearing a seatbelt while driving and not texting while driving; changing batteries in smoke detector at least once annually; wearing suntan lotion when outside; eating a balanced and moderate diet; getting physical activity at least 30 minutes per day.  2) Immunizations / Screenings / Labs:  Prevnar updated today. Declines Zostavax. All other immunizations are up-to-date per recommendations. Diabetic foot exam completed today. Due for dental visit which will be scheduled independently. All other screenings are up-to-date per recommendations. Obtain CBC, BMET, Lipid profile, PSA, and TSH.   Overall well exam. Patient has several risk factors for cardiovascular disease including hypertension, diabetes and obesity. Hypertension and diabetes are currently adequately controlled with current medication regimen. Changes to medication regimen will be made independent of this visit. Discussed importance of physical activity and eating nutrient dense diet. Goal to lose approximately 5-10% of current body weight. Follow-up prevention exam in 1 year. Follow-up office visit pending lab work and chronic medical conditions.

## 2014-11-07 NOTE — Progress Notes (Signed)
Subjective:    Patient ID: Matthew Keller, male    DOB: [birth year], 64 y.o.   MRN: [id number]  Chief Complaint  Patient presents with  . Establish Care    CPE, [place], referral to neurology    HPI:  Matthew Keller is a 64 y.o. male who presents today for an annual wellness visit.   1) Health Maintenance -    Diet - Averages about 2 meals per day consisting of fruits, vegetables, chicken, occasional fast food; Also Herbalife shakes. Rare caffeine.  Exercise - 30-40 minutes per day walking   2) Preventative Exams / Immunizations:  Dental -- Due for an exam  Vision -- Up to date   Health Maintenance  Topic Date Due  . FOOT EXAM  03/13/1959  . HIV Screening  03/12/1964  . ZOSTAVAX  03/12/2009  . URINE MICROALBUMIN  08/25/2013  . INFLUENZA VACCINE  11/07/2014  . PNA vac Low Risk Adult (1 of 2 - PCV13) 11/18/2014  . HEMOGLOBIN A1C  01/31/2015  . OPHTHALMOLOGY EXAM  07/19/2015  . COLONOSCOPY  11/11/2016  . TETANUS/TDAP  07/31/2024  Decline Shingles; Prevnar today  Immunization History  Administered Date(s) Administered  . Influenza Whole 01/06/2009  . Influenza-Unspecified 01/06/2014  . Pneumococcal Polysaccharide-23 11/17/2013  . Tdap 08/01/2014   No Known Allergies   Outpatient Prescriptions Prior to Visit  Medication Sig Dispense Refill  . acetaminophen (TYLENOL) 325 MG tablet Per bottle as needed     . furosemide (LASIX) 20 MG tablet Take 1 tablet (20 mg total) by mouth daily. 30 tablet 11  . glimepiride (AMARYL) 2 MG tablet TAKE 1 TABLET BY MOUTH EVERY DAY 90 tablet 2  . metFORMIN (GLUCOPHAGE) 500 MG tablet TAKE 1 TABLET (500 MG TOTAL) BY MOUTH 2 (TWO) TIMES DAILY WITH A MEAL. 60 tablet 5  . minoxidil (LONITEN) 10 MG tablet 1 tablet in am and 1/2 tablet in pm 60 tablet 5  . omeprazole (PRILOSEC) 20 MG capsule TAKE ONE CAPSULE BY MOUTH DAILY 30 capsule 3  . sulindac (CLINORIL) 200 MG tablet Take 1 tablet (200 mg total) by mouth 2 (two) times daily as needed.  180 tablet 3  . valsartan-hydrochlorothiazide (DIOVAN-HCT) 320-25 MG per tablet TAKE 1 TABLET BY MOUTH DAILY 30 tablet 5  . verapamil (CALAN-SR) 240 MG CR tablet TAKE 1 TABLET BY MOUTH ONCE A DAY 30 tablet 3  . levothyroxine (SYNTHROID, LEVOTHROID) 75 MCG tablet TAKE 1 TABLET BY MOUTH EVERY DAY 30 tablet 2   No facility-administered medications prior to visit.     Past Medical History  Diagnosis Date  . Hypertension   . Renal insufficiency     baseline 1.6 October 24, 2009 > 1.3 June 12, 2010  . DM (diabetes mellitus)   . Microcytic anemia   . Morbid obesity     all time high 310 2009.  target wt = 208 for BMI <30.  referred back to nutrition again June 12, 2010  . Atypical chest pain     [facility] 08-03-01 nl coronaries and lv fn, lvedp 30.  cardiac workup by Dr. Harrington Challenger 5/09 neg cardiolite for ischemia, rec rsik reduction  . Diverticulosis     colonoscopy 9/01........Marland KitchenDr. Deatra Ina  . OSA (obstructive sleep apnea)     on CPAP, sleep study 09/2007............Marland KitchenDr. 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 Healthcare maintenance     pneumovax 09/2006, Td 04/2004, CPX June 12, 2010  . Thyroid disease   . GERD (gastroesophageal reflux disease)   .  Chicken pox      Past Surgical History  Procedure Laterality Date  . Knee surgery       Family History  Problem Relation Age of Onset  . Throat cancer Brother     smoker  . Alcohol abuse Brother   . Diabetes Mother   . Colon cancer Neg Hx   . Alcohol abuse Father      History   Social History  . Marital Status: Married    Spouse Name: N/A  . Number of Children: 2  . Years of Education: 18   Occupational History  . Nature conservation officer for student development [employer]   Social History Main Topics  . Smoking status: Former Smoker -- 0.30 packs/day for 1 years    Types: Cigarettes    Quit date: 04/08/1985  . Smokeless tobacco: Never Used  . Alcohol Use: Yes     Comment: occasional  . Drug Use: No  . Sexual Activity: Not on file    Other Topics Concern  . 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     Review of Systems  Constitutional: Denies fever, chills, fatigue, or significant weight gain/loss. HENT: Head: Denies headache or neck pain Ears: Denies changes in hearing, ringing in ears, earache, drainage Nose: Denies discharge, stuffiness, itching, nosebleed, sinus pain Throat: Denies sore throat, hoarseness, dry mouth, sores, thrush Eyes: Denies loss/changes in vision, pain, redness, blurry/double vision, flashing lights Cardiovascular: Denies chest pain/discomfort, tightness, palpitations, shortness of breath with activity, difficulty lying down, swelling, sudden awakening with shortness of breath Respiratory: Denies shortness of breath, cough, sputum production, wheezing Gastrointestinal: Denies dysphasia, heartburn, change in appetite, nausea, change in bowel habits, rectal bleeding, constipation, diarrhea, yellow skin or eyes Genitourinary: Denies frequency, urgency, burning/pain, blood in urine, incontinence, change in urinary strength. Musculoskeletal: Denies muscle/joint pain, stiffness, back pain, redness or swelling of joints, trauma Associated symptom of pain located in his lower back has been going on for about a year. Pain is located mainly on the left with some radiculopathy. Thinks that he may have gotten up wrong at one point. Pain is described as achy and severity that is mild. Aggravated by laying towards the one side.  Skin: Denies rashes, lumps, itching, dryness, color changes, or hair/nail changes Neurological: Denies dizziness, fainting, seizures, weakness, numbness, tingling, tremor Psychiatric - Denies nervousness, stress, depression or memory loss Endocrine: Denies heat or cold intolerance, sweating, frequent urination,  excessive thirst, changes in appetite Hematologic: Denies ease of bruising or bleeding     Objective:    BP 140/78 mmHg  Pulse 82  Temp(Src) 98 F (36.7 C) (Oral)  Resp 18  Ht 5\' 11"  (1.803 m)  Wt 283 lb 12.8 oz (128.731 kg)  BMI 39.60 kg/m2  SpO2 98% Nursing note and vital signs reviewed.  Physical Exam  Constitutional: He is oriented to person, place, and time. He appears well-developed and well-nourished.  HENT:  Head: Normocephalic.  Right Ear: Hearing, tympanic membrane, external ear and ear canal normal.  Left Ear: Hearing, tympanic membrane, external ear and ear canal normal.  Nose: Nose normal.  Mouth/Throat: Uvula is midline, oropharynx is clear and moist and mucous membranes are normal.  Eyes: Conjunctivae and EOM are normal. Pupils are equal, round, and reactive to light.  Neck: Neck  supple. No JVD present. No tracheal deviation present. No thyromegaly present.  Cardiovascular: Normal rate, regular rhythm, normal heart sounds and intact distal pulses.   Pulmonary/Chest: Effort normal and breath sounds normal.  Abdominal: Soft. Bowel sounds are normal. He exhibits no distension and no mass. There is no tenderness. There is no rebound and no guarding.  Musculoskeletal: Normal range of motion. He exhibits no edema or tenderness.  Low back: No obvious deformity, discoloration, or edema noted. Mild increased lordotic curve noted. No palpable tenderness able to be elicited presently. Displays full active range of motion in all directions with no discomfort. Straight leg raise is negative. Distal pulses, sensation, and reflexes are intact and appropriate.  Lymphadenopathy:    He has no cervical adenopathy.  Neurological: He is alert and oriented to person, place, and time. He has normal reflexes. No cranial nerve deficit. He exhibits normal muscle tone. Coordination normal.  Diabetic foot exam - bilateral feet are free from skin breakdown, cuts, and abrasions. Toenails are  neatly trimmed. Pulses are intact and appropriate. Sensation is intact to monofilament bilaterally.  Skin: Skin is warm and dry.  Psychiatric: He has a normal mood and affect. His behavior is normal. Judgment and thought content normal.       Assessment & Plan:   Problem List Items Addressed This Visit      Other   Routine general medical examination at a health care facility - Primary    1) Anticipatory Guidance: Discussed importance of wearing a seatbelt while driving and not texting while driving; changing batteries in smoke detector at least once annually; wearing suntan lotion when outside; eating a balanced and moderate diet; getting physical activity at least 30 minutes per day.  2) Immunizations / Screenings / Labs:  Prevnar updated today. Declines Zostavax. All other immunizations are up-to-date per recommendations. Diabetic foot exam completed today. Due for dental visit which will be scheduled independently. All other screenings are up-to-date per recommendations. Obtain CBC, BMET, Lipid profile, PSA, and TSH.   Overall well exam. Patient has several risk factors for cardiovascular disease including hypertension, diabetes and obesity. Hypertension and diabetes are currently adequately controlled with current medication regimen. Changes to medication regimen will be made independent of this visit. Discussed importance of physical activity and eating nutrient dense diet. Goal to lose approximately 5-10% of current body weight. Follow-up prevention exam in 1 year. Follow-up office visit pending lab work and chronic medical conditions.       Relevant Orders   Basic metabolic panel   CBC   Lipid panel   PSA   TSH   Low back pain    Symptoms and exam consistent with muscle imbalance. Start home exercise therapy program with exercises provided. Treat conservatively with ice/heat, stretching, and anti-inflammatories as needed. Follow-up if symptoms worsen or fail to improve for  further imaging or treatment.       Other Visit Diagnoses    Need for vaccination with 13-polyvalent pneumococcal conjugate vaccine        Relevant Orders    Pneumococcal conjugate vaccine 13-valent IM

## 2014-11-07 NOTE — Assessment & Plan Note (Signed)
Symptoms and exam consistent with muscle imbalance. Start home exercise therapy program with exercises provided. Treat conservatively with ice/heat, stretching, and anti-inflammatories as needed. Follow-up if symptoms worsen or fail to improve for further imaging or treatment.

## 2014-11-08 ENCOUNTER — Telehealth: Payer: Self-pay | Admitting: Family

## 2014-11-08 DIAGNOSIS — R7989 Other specified abnormal findings of blood chemistry: Secondary | ICD-10-CM

## 2014-11-08 DIAGNOSIS — R718 Other abnormality of red blood cells: Secondary | ICD-10-CM

## 2014-11-08 NOTE — Telephone Encounter (Signed)
Please inform patient that his blood work shows that his prostate, cholesterol and thyroid were all within the normal limits. His hemoglobin was slightly decreased and the size of his red blood cells is small. Therefore I would like to further test his iron levels to ensure he does not have an iron deficiency. The labs have been placed and he can complete them at his leisure. Also his kidney function was slightly decreased compared to previous readings which may be the result of dehydration. Therefore please drink plenty of water and we will retest it when we test his iron levels. There is no need to fast for these tests.

## 2014-11-09 NOTE — Telephone Encounter (Signed)
Advised patient wife of greg calones note, patient is out of town, but wife will let him know to come back in for labs when he returns

## 2014-11-10 ENCOUNTER — Other Ambulatory Visit (INDEPENDENT_AMBULATORY_CARE_PROVIDER_SITE_OTHER): Payer: Medicare PPO

## 2014-11-10 DIAGNOSIS — R7989 Other specified abnormal findings of blood chemistry: Secondary | ICD-10-CM

## 2014-11-10 DIAGNOSIS — R718 Other abnormality of red blood cells: Secondary | ICD-10-CM

## 2014-11-10 DIAGNOSIS — R748 Abnormal levels of other serum enzymes: Secondary | ICD-10-CM | POA: Diagnosis not present

## 2014-11-10 LAB — RENAL FUNCTION PANEL
Albumin: 3.9 g/dL (ref 3.5–5.2)
BUN: 17 mg/dL (ref 6–23)
CO2: 32 meq/L (ref 19–32)
Calcium: 10.1 mg/dL (ref 8.4–10.5)
Chloride: 100 mEq/L (ref 96–112)
Creatinine, Ser: 1.49 mg/dL (ref 0.40–1.50)
GFR: 60.75 mL/min (ref >60.00)
Glucose, Bld: 130 mg/dL — ABNORMAL HIGH (ref 70–99)
Phosphorus: 2.3 mg/dL (ref 2.3–4.6)
Potassium: 4.2 mEq/L (ref 3.5–5.1)
Sodium: 137 mEq/L (ref 135–145)

## 2014-11-10 LAB — IBC PANEL
IRON: 72 ug/dL (ref 42–165)
Saturation Ratios: 21.9 % (ref 20.0–50.0)
Transferrin: 235 mg/dL (ref 212.0–360.0)

## 2014-11-10 NOTE — Telephone Encounter (Signed)
Please inform patient that his kidney function has returned to previous levels, therefore no further testing is needed. His iron levels are within the normal ranges. With his red blood cells being small it would not hurt to have some additional iron supplementation if he would like. Otherwise we will plan to follow up as needed.

## 2014-11-11 NOTE — Telephone Encounter (Signed)
Pt's wife aware of results

## 2014-11-17 ENCOUNTER — Other Ambulatory Visit: Payer: Self-pay | Admitting: Internal Medicine

## 2014-11-30 ENCOUNTER — Telehealth: Payer: Self-pay | Admitting: Pulmonary Disease

## 2014-11-30 NOTE — Telephone Encounter (Signed)
CPAP works well Poor Usage Increase to 6 hours nightly

## 2014-12-01 NOTE — Telephone Encounter (Signed)
Patient notified.  Nothing further needed. 

## 2014-12-05 ENCOUNTER — Other Ambulatory Visit: Payer: Self-pay | Admitting: Internal Medicine

## 2014-12-06 ENCOUNTER — Telehealth: Payer: Self-pay | Admitting: *Deleted

## 2014-12-06 MED ORDER — VALSARTAN-HYDROCHLOROTHIAZIDE 320-25 MG PO TABS
1.0000 | ORAL_TABLET | Freq: Every day | ORAL | Status: DC
Start: 2014-12-06 — End: 2015-12-29

## 2014-12-06 MED ORDER — OMEPRAZOLE 20 MG PO CPDR: 20.0000 mg | DELAYED_RELEASE_CAPSULE | Freq: Every day | ORAL | Status: DC

## 2014-12-06 MED ORDER — METFORMIN HCL 500 MG PO TABS: ORAL_TABLET | ORAL | Status: DC

## 2014-12-06 MED ORDER — VERAPAMIL HCL ER 240 MG PO TBCR: 240.0000 mg | EXTENDED_RELEASE_TABLET | Freq: Every day | ORAL | Status: DC

## 2014-12-06 MED ORDER — FUROSEMIDE 20 MG PO TABS: 20.0000 mg | ORAL_TABLET | Freq: Every day | ORAL | Status: DC

## 2014-12-06 MED ORDER — LEVOTHYROXINE SODIUM 75 MCG PO TABS: 75.0000 ug | ORAL_TABLET | Freq: Every day | ORAL | Status: DC

## 2014-12-06 MED ORDER — GLIMEPIRIDE 2 MG PO TABS: 2.0000 mg | ORAL_TABLET | Freq: Every day | ORAL | Status: DC

## 2014-12-06 NOTE — Telephone Encounter (Signed)
Pt states he no longer see Dr. Melvyn Novas needing new scripts sent on all meds to pharmacy under Terri Piedra since he is his new md. Inform pt will send script to pharmacy electronically...Matthew Keller

## 2014-12-08 ENCOUNTER — Encounter: Payer: Self-pay | Admitting: Pulmonary Disease

## 2015-03-22 ENCOUNTER — Telehealth: Payer: Self-pay

## 2015-03-22 NOTE — Telephone Encounter (Signed)
Left Voice Mail for pt to call back.   RE: Flu Vaccine for 2016  

## 2015-06-21 ENCOUNTER — Ambulatory Visit (INDEPENDENT_AMBULATORY_CARE_PROVIDER_SITE_OTHER): Payer: Medicare Other

## 2015-06-21 DIAGNOSIS — Z23 Encounter for immunization: Secondary | ICD-10-CM | POA: Diagnosis not present

## 2015-06-27 ENCOUNTER — Ambulatory Visit: Payer: Medicare Other | Admitting: Family

## 2015-06-29 ENCOUNTER — Encounter: Payer: Self-pay | Admitting: Family

## 2015-06-29 ENCOUNTER — Other Ambulatory Visit (INDEPENDENT_AMBULATORY_CARE_PROVIDER_SITE_OTHER): Payer: Medicare Other

## 2015-06-29 ENCOUNTER — Ambulatory Visit (INDEPENDENT_AMBULATORY_CARE_PROVIDER_SITE_OTHER): Payer: Medicare Other | Admitting: Family

## 2015-06-29 VITALS — BP 160/88 | HR 72 | Temp 98.1°F | Resp 16 | Ht 71.0 in | Wt 292.0 lb

## 2015-06-29 DIAGNOSIS — E038 Other specified hypothyroidism: Secondary | ICD-10-CM

## 2015-06-29 DIAGNOSIS — N521 Erectile dysfunction due to diseases classified elsewhere: Secondary | ICD-10-CM | POA: Diagnosis not present

## 2015-06-29 DIAGNOSIS — E119 Type 2 diabetes mellitus without complications: Secondary | ICD-10-CM

## 2015-06-29 DIAGNOSIS — N529 Male erectile dysfunction, unspecified: Secondary | ICD-10-CM | POA: Insufficient documentation

## 2015-06-29 LAB — BASIC METABOLIC PANEL
BUN: 13 mg/dL (ref 6–23)
CALCIUM: 10.2 mg/dL (ref 8.4–10.5)
CHLORIDE: 102 meq/L (ref 96–112)
CO2: 31 mEq/L (ref 19–32)
CREATININE: 1.45 mg/dL (ref 0.40–1.50)
GFR: 62.56 mL/min (ref >60.00)
Glucose, Bld: 98 mg/dL (ref 70–99)
POTASSIUM: 4.2 meq/L (ref 3.5–5.1)
SODIUM: 139 meq/L (ref 135–145)

## 2015-06-29 LAB — HEMOGLOBIN A1C: Hgb A1c MFr Bld: 6.9 % — ABNORMAL HIGH (ref 4.6–6.5)

## 2015-06-29 MED ORDER — SILDENAFIL CITRATE 100 MG PO TABS: 100.0000 mg | ORAL_TABLET | Freq: Every day | ORAL | Status: DC | PRN

## 2015-06-29 NOTE — Progress Notes (Signed)
Subjective:    Patient ID: Matthew Keller, male    DOB: Oct 09, 1950, 65 y.o.   MRN: [id number]  Chief Complaint  Patient presents with  . Erectile Dysfunction    HPI:  Matthew Keller is a 65 y.o. male who  has a past medical history of Hypertension; Renal insufficiency; DM (diabetes mellitus) ([place]); Microcytic anemia; Morbid obesity ([place]); Atypical chest pain; Diverticulosis; OSA (obstructive sleep apnea); Healthcare maintenance; Thyroid disease; GERD (gastroesophageal reflux disease); and Chicken pox. and presents today for an office visit.   Associated symptom of erectile dysfunction has been going on for about 1 year. States that he has some difficulty obtaining an erection on occasion but is able to maintain an erection after he starts. Modifying factors include a medication that worked, but cannot remember what it was.   No Known Allergies   Current Outpatient Prescriptions on File Prior to Visit  Medication Sig Dispense Refill  . acetaminophen (TYLENOL) 325 MG tablet Per bottle as needed     . furosemide (LASIX) 20 MG tablet Take 1 tablet (20 mg total) by mouth daily. 90 tablet 3  . glimepiride (AMARYL) 2 MG tablet Take 1 tablet (2 mg total) by mouth daily. 90 tablet 3  . levothyroxine (SYNTHROID, LEVOTHROID) 75 MCG tablet Take 1 tablet (75 mcg total) by mouth daily. 90 tablet 3  . metFORMIN (GLUCOPHAGE) 500 MG tablet TAKE 1 TABLET (500 MG TOTAL) BY MOUTH 2 (TWO) TIMES DAILY WITH A MEAL. 180 tablet 3  . minoxidil (LONITEN) 10 MG tablet 1 tablet in am and 1/2 tablet in pm 60 tablet 5  . omeprazole (PRILOSEC) 20 MG capsule Take 1 capsule (20 mg total) by mouth daily. 90 capsule 3  . valsartan-hydrochlorothiazide (DIOVAN-HCT) 320-25 MG per tablet Take 1 tablet by mouth daily. 90 tablet 3  . verapamil (CALAN-SR) 240 MG CR tablet Take 1 tablet (240 mg total) by mouth daily. 90 tablet 3   No current facility-administered medications on file prior to visit.    Review of Systems    Constitutional: Negative for fever and chills.  Respiratory: Negative for chest tightness and shortness of breath.   Cardiovascular: Negative for chest pain, palpitations and leg swelling.  Neurological: Negative for headaches.      Objective:    BP 160/88 mmHg  Pulse 72  Temp(Src) 98.1 F (36.7 C) (Oral)  Resp 16  Ht 5\' 11"  (1.803 m)  Wt 292 lb (132.45 kg)  BMI 40.74 kg/m2  SpO2 97% Nursing note and vital signs reviewed.  Physical Exam  Constitutional: He is oriented to person, place, and time. He appears well-developed and well-nourished. No distress.  Cardiovascular: Normal rate, regular rhythm, normal heart sounds and intact distal pulses.   Pulmonary/Chest: Effort normal and breath sounds normal.  Neurological: He is alert and oriented to person, place, and time.  Skin: Skin is warm and dry.  Psychiatric: He has a normal mood and affect. His behavior is normal. Judgment and thought content normal.       Assessment & Plan:   Problem List Items Addressed This Visit      Endocrine   Hypothyroidism    Currently maintained on levothyroxine. Obtain TSH. Continue current dosage of levothyroxine pending TSH results.      Relevant Orders   TSH   Diabetes mellitus without complication (HCC)    Obtain A1c and basic metabolic profile. Continue current dosage of glimepiride and metformin pending A1c results.      Relevant Orders  Hemoglobin [id number]   Basic Metabolic Panel (BMET)     Genitourinary   Erectile dysfunction - Primary    Erectile dysfunction of multifactorial cause including comorbidities and obesity. Discussed treatment options with risks and benefits associated with medication. Also emphasize importance of lifestyle management. Start Viagra. Follow-up pending trial of medication.      Relevant Medications   sildenafil (VIAGRA) 100 MG tablet     Other   Morbid obesity (HCC)    BMI continues to be on the rise with BMI of now 40 indicating severe obesity.  Emphasize importance of increasing physical activity with goal of 30 minutes of moderate level activity daily. Discussed importance of consuming a diet that is nutrient dense and moderate, varied, and balanced and low in saturated fats and processed/sugary foods. Recommended weight loss goal approximately 5-10% of current body weight in the next 6 months as able.

## 2015-06-29 NOTE — Assessment & Plan Note (Signed)
Obtain A1c and basic metabolic profile. Continue current dosage of glimepiride and metformin pending A1c results.

## 2015-06-29 NOTE — Progress Notes (Signed)
Pre visit review using our clinic review tool, if applicable. No additional management support is needed unless otherwise documented below in the visit note. 

## 2015-06-29 NOTE — Assessment & Plan Note (Signed)
Currently maintained on levothyroxine. Obtain TSH. Continue current dosage of levothyroxine pending TSH results.

## 2015-06-29 NOTE — Assessment & Plan Note (Signed)
Erectile dysfunction of multifactorial cause including comorbidities and obesity. Discussed treatment options with risks and benefits associated with medication. Also emphasize importance of lifestyle management. Start Viagra. Follow-up pending trial of medication.

## 2015-06-29 NOTE — Assessment & Plan Note (Signed)
BMI continues to be on the rise with BMI of now 40 indicating severe obesity. Emphasize importance of increasing physical activity with goal of 30 minutes of moderate level activity daily. Discussed importance of consuming a diet that is nutrient dense and moderate, varied, and balanced and low in saturated fats and processed/sugary foods. Recommended weight loss goal approximately 5-10% of current body weight in the next 6 months as able.

## 2015-06-29 NOTE — Patient Instructions (Addendum)
Thank you for choosing [facility].  Summary/Instructions:  Your prescription(s) have been submitted to your pharmacy or been printed and provided for you. Please take as directed and contact our office if you believe you are having problem(s) with the medication(s) or have any questions.  Please stop by the lab on the basement level of the building for your blood work. Your results will be released to [facility] (or called to you) after review, usually within 72 hours after test completion. If any changes need to be made, you will be notified at that same time.  If your symptoms worsen or fail to improve, please contact our office for further instruction, or in case of emergency go directly to the emergency room at the closest medical facility.   Erectile Dysfunction Erectile dysfunction is the inability to get or sustain a good enough erection to have sexual intercourse. Erectile dysfunction may involve:  Inability to get an erection.  Lack of enough hardness to allow penetration.  Loss of the erection before sex is finished.  Premature ejaculation. CAUSES  Certain drugs, such as:  Pain relievers.  Antihistamines.  Antidepressants.  Blood pressure medicines.  Water pills (diuretics).  Ulcer medicines.  Muscle relaxants.  Illegal drugs.  Excessive drinking.  Psychological causes, such as:  Anxiety.  Depression.  Sadness.  Exhaustion.  Performance fear.  Stress.  Physical causes, such as:  Artery problems. This may include diabetes, smoking, liver disease, or atherosclerosis.  High blood pressure.  Hormonal problems, such as low testosterone.  Obesity.  Nerve problems. This may include back or pelvic injuries, diabetes mellitus, multiple sclerosis, or Parkinson disease. SYMPTOMS  Inability to get an erection.  Lack of enough hardness to allow penetration.  Loss of the erection before sex is finished.  Premature ejaculation.  Normal  erections at some times, but with frequent unsatisfactory episodes.  Orgasms that are not satisfactory in sensation or frequency.  Low sexual satisfaction in either partner because of erection problems.  A curved penis occurring with erection. The curve may cause pain or may be too curved to allow for intercourse.  Never having nighttime erections. DIAGNOSIS Your caregiver can often diagnose this condition by:  Performing a physical exam to find other diseases or specific problems with the penis.  Asking you detailed questions about the problem.  Performing blood tests to check for diabetes mellitus or to measure hormone levels.  Performing urine tests to find other underlying health conditions.  Performing an ultrasound exam to check for scarring.  Performing a test to check blood flow to the penis.  Doing a sleep study at home to measure nighttime erections. TREATMENT   You may be prescribed medicines by mouth.  You may be given medicine injections into the penis.  You may be prescribed a vacuum pump with a ring.  Penile implant surgery may be performed. You may receive:  An inflatable implant.  A semirigid implant.  Blood vessel surgery may be performed. HOME CARE INSTRUCTIONS  If you are prescribed oral medicine, you should take the medicine as prescribed. Do not increase the dosage without first discussing it with your physician.  If you are using self-injections, be careful to avoid any veins that are on the surface of the penis. Apply pressure to the injection site for 5 minutes.  If you are using a vacuum pump, make sure you have read the instructions before using it. Discuss any questions with your physician before taking the pump home. SEEK MEDICAL CARE IF:  You experience pain that is not responsive to the pain medicine you have been prescribed.  You experience nausea or vomiting. SEEK IMMEDIATE MEDICAL CARE IF:   When taking oral or injectable  medications, you experience an erection that lasts longer than 4 hours. If your physician is unavailable, go to the nearest emergency room for evaluation. An erection that lasts much longer than 4 hours can result in permanent damage to your penis.  You have pain that is severe.  You develop redness, severe pain, or severe swelling of your penis.  You have redness spreading up into your groin or lower abdomen.  You are unable to pass your urine.   This information is not intended to replace advice given to you by your health care provider. Make sure you discuss any questions you have with your health care provider.   Document Released: 03/22/2000 Document Revised: 11/25/2012 Document Reviewed: 08/27/2012 Elsevier Interactive Patient Education [employer].

## 2015-06-30 ENCOUNTER — Other Ambulatory Visit: Payer: Self-pay | Admitting: Adult Health

## 2015-06-30 LAB — TSH: TSH: 2.82 u[IU]/mL (ref 0.35–4.50)

## 2015-07-01 ENCOUNTER — Telehealth: Payer: Self-pay | Admitting: Family

## 2015-07-01 NOTE — Telephone Encounter (Signed)
Please inform patient that his thyroid function is stable and his kidney function and electrolytes are within the normal ranges. His A1c is also improved from 7.3 to 6.9. Therefore, no medication changes are needed at this time. Please continue taking medications as prescribed and will follow-up in 6 months.

## 2015-07-03 NOTE — Telephone Encounter (Signed)
Pt's wife aware of results

## 2015-09-28 ENCOUNTER — Other Ambulatory Visit: Payer: Self-pay | Admitting: Internal Medicine

## 2015-10-31 ENCOUNTER — Other Ambulatory Visit: Payer: Self-pay | Admitting: Internal Medicine

## 2015-11-21 ENCOUNTER — Other Ambulatory Visit: Payer: Self-pay | Admitting: Internal Medicine

## 2015-12-20 ENCOUNTER — Other Ambulatory Visit: Payer: Self-pay | Admitting: Family

## 2015-12-29 ENCOUNTER — Other Ambulatory Visit: Payer: Self-pay | Admitting: Family

## 2016-01-02 ENCOUNTER — Other Ambulatory Visit: Payer: Self-pay | Admitting: Family

## 2016-02-21 ENCOUNTER — Encounter: Payer: Self-pay | Admitting: Internal Medicine

## 2016-02-21 LAB — HM DIABETES EYE EXAM

## 2016-04-14 ENCOUNTER — Other Ambulatory Visit: Payer: Self-pay | Admitting: Adult Health

## 2016-07-09 ENCOUNTER — Encounter (HOSPITAL_COMMUNITY): Payer: Self-pay | Admitting: Emergency Medicine

## 2016-07-09 ENCOUNTER — Ambulatory Visit (HOSPITAL_COMMUNITY)
Admission: EM | Admit: 2016-07-09 | Discharge: 2016-07-09 | Disposition: A | Discharge status: Home / Self Care | Payer: Medicare Other | Attending: Family Medicine | Admitting: Family Medicine

## 2016-07-09 DIAGNOSIS — I10 Essential (primary) hypertension: Secondary | ICD-10-CM

## 2016-07-09 NOTE — ED Provider Notes (Signed)
CSN: [id number]     Arrival date & time 07/09/16  1323 History   First MD Initiated Contact with Patient 07/09/16 1348     Chief Complaint  Patient presents with  . Hypertension   (Consider location/radiation/quality/duration/timing/severity/associated sxs/prior Treatment) 66 year old male presents to clinic for a weeklong history of elevated pressure. He does have a past history of hypertension and is managed by his primary care provider on multiple medications. He denies any chest pain, shortness of breath, nausea, vomiting, diarrhea, any weakness, dizziness, headache, unilateral numbness, shortness of breath, he does have swelling in his feet and ankles ongoing for 1 week, he does state he has not taken his Lasix recently. He denies smoking, denies alcohol intake, denies caffeine intake, states she has been taking an over-the-counter supplement for energy, is unsure what his in it.   The history is provided by the patient.  Hypertension  This is a chronic problem. The current episode started more than 1 week ago. The problem occurs constantly. The problem has been gradually worsening. Pertinent negatives include no chest pain, no abdominal pain and no headaches.    Past Medical History:  Diagnosis Date  . Atypical chest pain    [facility] 08-03-01 nl coronaries and lv fn, lvedp 30.  cardiac workup by Dr. Harrington Challenger 5/09 neg cardiolite for ischemia, rec rsik reduction  . Chicken pox   . Diverticulosis    colonoscopy 9/01........Marland KitchenDr. Deatra Ina  . DM (diabetes mellitus) ([place])   . GERD (gastroesophageal reflux disease)   . Healthcare maintenance    pneumovax 09/2006, Td 04/2004, CPX June 12, 2010  . Hypertension   . Microcytic anemia   . Morbid obesity ([place])    all time high 310 2009.  target wt = 208 for BMI <30.  referred back to nutrition again June 12, 2010  . 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 Renal insufficiency     baseline 1.6 October 24, 2009 > 1.3 June 12, 2010  . Thyroid disease    Past Surgical History:  Procedure Laterality Date  . KNEE SURGERY     Family History  Problem Relation Age of Onset  . Throat cancer Brother     smoker  . Alcohol abuse Brother   . Diabetes Mother   . Colon cancer Neg Hx   . Alcohol abuse Father    Social History  Substance Use Topics  . Smoking status: Former Smoker    Packs/day: 0.30    Years: 1.00    Types: Cigarettes    Quit date: 04/08/1985  . Smokeless tobacco: Never Used  . Alcohol use Yes     Comment: occasional    Review of Systems  Constitutional: Negative for chills and fatigue.  HENT: Negative for congestion, ear discharge, ear pain, facial swelling and rhinorrhea.   Eyes: Negative for pain, redness and visual disturbance.  Respiratory: Negative for choking, chest tightness and wheezing.   Cardiovascular: Positive for leg swelling. Negative for chest pain and palpitations.  Gastrointestinal: Negative for abdominal pain, constipation, diarrhea, nausea and vomiting.  Genitourinary: Negative for dysuria, frequency and urgency.  Musculoskeletal: Negative for back pain, myalgias, neck pain and neck stiffness.  Skin: Negative for color change and pallor.  Neurological: Negative for dizziness, syncope, facial asymmetry, weakness, light-headedness, numbness and headaches.  All other systems reviewed and are negative.   Allergies  Patient has no known allergies.  Home Medications   Prior to  Admission medications   Medication Sig Start Date End Date Taking? Authorizing Provider  acetaminophen (TYLENOL) 325 MG tablet Per bottle as needed     Historical Provider, MD  furosemide (LASIX) 20 MG tablet Take 1 tablet (20 mg total) by mouth daily. 12/06/14   Golden Circle, FNP  furosemide (LASIX) 20 MG tablet TAKE 1 TABLET (20 MG TOTAL) BY MOUTH DAILY. 01/03/16   Golden Circle, FNP  glimepiride (AMARYL) 2 MG tablet Take 1 tablet (2 mg total) by mouth  daily. 12/06/14   Golden Circle, FNP  glimepiride (AMARYL) 2 MG tablet TAKE 1 TABLET BY MOUTH EVERY DAY 01/03/16   Golden Circle, FNP  levothyroxine (SYNTHROID, LEVOTHROID) 75 MCG tablet TAKE 1 TABLET (75 MCG TOTAL) BY MOUTH DAILY. 12/29/15   Golden Circle, FNP  metFORMIN (GLUCOPHAGE) 500 MG tablet TAKE 1 TABLET (500 MG TOTAL) BY MOUTH 2 (TWO) TIMES DAILY WITH A MEAL. 12/29/15   Golden Circle, FNP  minoxidil (LONITEN) 10 MG tablet TAKE 1 TABLET IN AM AND 1/2 TABLET IN PM 04/15/16   Tammy S Parrett, NP  omeprazole (PRILOSEC) 20 MG capsule TAKE 1 CAPSULE (20 MG TOTAL) BY MOUTH DAILY. 12/20/15   Golden Circle, FNP  sildenafil (VIAGRA) 100 MG tablet Take 1 tablet (100 mg total) by mouth daily as needed for erectile dysfunction. 06/29/15   Golden Circle, FNP  valsartan-hydrochlorothiazide (DIOVAN-HCT) 320-25 MG tablet TAKE 1 TABLET BY MOUTH DAILY. 12/29/15   Golden Circle, FNP  verapamil (CALAN-SR) 240 MG CR tablet TAKE 1 TABLET (240 MG TOTAL) BY MOUTH DAILY. 12/29/15   Golden Circle, FNP   Meds Ordered and Administered this Visit  Medications - No data to display  BP (!) 193/92 (BP Location: Right Arm)   Pulse 67   Temp 98.5 F (36.9 C) (Oral)   Resp 14   SpO2 99%  No data found.   Physical Exam  Constitutional: He is oriented to person, place, and time. He appears well-developed and well-nourished. No distress.  HENT:  Head: Normocephalic and atraumatic.  Right Ear: External ear normal.  Left Ear: External ear normal.  Neck: Normal range of motion.  Cardiovascular: Normal rate, regular rhythm, normal heart sounds and intact distal pulses.   Pulmonary/Chest: Effort normal and breath sounds normal.  Abdominal: Soft. Bowel sounds are normal.  Musculoskeletal: He exhibits edema (+1). He exhibits no tenderness.  Neurological: He is alert and oriented to person, place, and time.  Skin: Skin is warm and dry. Capillary refill takes less than 2 seconds. He is not diaphoretic.   Psychiatric: He has a normal mood and affect. His behavior is normal.  Nursing note and vitals reviewed.   Urgent Care Course     Procedures (including critical care time)  Labs Review Labs Reviewed - No data to display  Imaging Review No results found.     MDM   1. Essential hypertension    No acute signs of distress. Encouraged him to follow up with his primary care provider as soon as possible for the management of his hypertension. Also encouraged him to stop taking the over-the-counter supplements, recommend following up to the emergency room at any time he have symptoms of chest pain, shortness of breath, headache, blurred vision, or other symptoms.    Barnet Glasgow, NP 07/09/16 1420

## 2016-07-09 NOTE — Discharge Instructions (Signed)
For your hypertension, I would recommend you schedule an appointment with your primary care provider as soon as possible. If at any time you experience chest pain, headache, blurred vision, nausea, vomiting, or other symptoms, then go to the emergency room as soon as possible. I also recommend avoiding caffeine, coffee, tea, energy drinks, supplements, or any other source of stimulants.

## 2016-07-09 NOTE — ED Triage Notes (Addendum)
The patient presented to the [facility] with a complaint of "not feeling well" x 4 days. He stated that he checked his blood pressure at home today and it was 197/115. The patient reported that he has been using an OTC testosterone and energy booster and believed that could have raised his BP.

## 2016-07-16 ENCOUNTER — Encounter: Payer: Self-pay | Admitting: Family

## 2016-07-16 ENCOUNTER — Ambulatory Visit (INDEPENDENT_AMBULATORY_CARE_PROVIDER_SITE_OTHER): Payer: Medicare Other | Admitting: Family

## 2016-07-16 VITALS — BP 178/86 | HR 87 | Temp 98.1°F | Resp 16 | Ht 71.0 in | Wt 285.0 lb

## 2016-07-16 DIAGNOSIS — G4733 Obstructive sleep apnea (adult) (pediatric): Secondary | ICD-10-CM | POA: Diagnosis not present

## 2016-07-16 DIAGNOSIS — I10 Essential (primary) hypertension: Secondary | ICD-10-CM

## 2016-07-16 DIAGNOSIS — E119 Type 2 diabetes mellitus without complications: Secondary | ICD-10-CM | POA: Diagnosis not present

## 2016-07-16 DIAGNOSIS — Z0000 Encounter for general adult medical examination without abnormal findings: Secondary | ICD-10-CM

## 2016-07-16 DIAGNOSIS — E7800 Pure hypercholesterolemia, unspecified: Secondary | ICD-10-CM

## 2016-07-16 DIAGNOSIS — I739 Peripheral vascular disease, unspecified: Secondary | ICD-10-CM

## 2016-07-16 MED ORDER — OMEPRAZOLE 20 MG PO CPDR
20.0000 mg | DELAYED_RELEASE_CAPSULE | Freq: Every day | ORAL | 2 refills | Status: DC
Start: 2016-07-16 — End: 2017-04-10

## 2016-07-16 MED ORDER — METOPROLOL SUCCINATE ER 25 MG PO TB24: 25.0000 mg | ORAL_TABLET | Freq: Every day | ORAL | 3 refills | Status: DC

## 2016-07-16 MED ORDER — LEVOTHYROXINE SODIUM 75 MCG PO TABS: 75.0000 ug | ORAL_TABLET | Freq: Every day | ORAL | 0 refills | Status: DC

## 2016-07-16 NOTE — Assessment & Plan Note (Signed)
Obtain lipid profile. Not currently maintained on medication. Continue to monitor pending lipid profile results.

## 2016-07-16 NOTE — Assessment & Plan Note (Signed)
Reviewed and updated patient's medical, surgical, family and social history. Medications and allergies were also reviewed. Basic screenings for depression, activities of daily living, hearing, cognition and safety were performed. Provider list was updated and health plan was provided to the patient.  

## 2016-07-16 NOTE — Addendum Note (Signed)
Addended by: Mauricio Po D on: 07/16/2016 10:36 PM   Modules accepted: Orders

## 2016-07-16 NOTE — Assessment & Plan Note (Signed)
Maintained on sleep apnea and usage of CPAP. Recommend follow up with pulmonology for CPAP titration as needed as it may be a contributing factor to his increased blood pressure. Continue to monitor.

## 2016-07-16 NOTE — Assessment & Plan Note (Signed)
Blood pressure elevated above goal of 140/90 with current medication regimen and no adverse side effects. He notes that hehas felt a little run down recently. Obtain stress test. Continue current dsoage of furosemide, valsaratan-hctz, verapamil and will add metoprolol. Encouraged her blood pressure at home and follow low-sodium diet. Continue to monitor.

## 2016-07-16 NOTE — Assessment & Plan Note (Signed)
Obtain hemoglobin A1c and urine microalbumin. Diabetic foot exam completed today. Maintained on valsartan for CAD risk reduction. Continue current dosage of metformin and glimepiride pending A1c results. Pneumovax is up-to-date. Continue to monitor.

## 2016-07-16 NOTE — Assessment & Plan Note (Signed)
Appears stable with risk factor management and monitoring. No cramping or other symptoms currently. Continue to monitor.

## 2016-07-16 NOTE — Assessment & Plan Note (Addendum)
1) Anticipatory Guidance: Discussed importance of wearing a seatbelt while driving and not texting while driving; changing batteries in smoke detector at least once annually; wearing suntan lotion when outside; eating a balanced and moderate diet; getting physical activity at least 30 minutes per day.  2) Immunizations / Screenings / Labs:  All immunizations are up to date per recommendations. Obtain Hepatitis C antibody for Hepatitis C screening. Obtain PSA for prostate cancer screening. Colon cancer screening is due in August of this year. All other screenings are up-to-date per recommendations. Obtain CBC, CMET, and lipid profile.   Overall well exam with risk factors for cardiovascular disease including hypertension, hyperlipidemia, obesity, obstructive sleep apnea, and type 2 diabetes. Chronic conditions remain labile with blood pressure being elevated today above goal 140/90. Most recent A1c of 6.9 indicates adequate control of diabetes. Maintained on medication regimens with no adverse side effects. Encouraged increased physical activity to 30 minutes of moderate level activity at least 2-3 days per week with goal steps of 10,000 steps. Continue other healthy lifestyle behaviors and choices. Follow-up prevention exam in 1 year. Follow-up office visit pending blood work and for chronic conditions.

## 2016-07-16 NOTE — Progress Notes (Signed)
Subjective:    Patient ID: Matthew Keller, male    DOB: 02/09/1951, 66 y.o.   MRN: [id number]  Chief Complaint  Patient presents with  . CPE    not fasting    HPI:  Matthew Keller is a 66 y.o. male who presents today for a Medicare Annual Wellness/Physical exam.    1) Health Maintenance -   Diet - Averaging about 2 meals per day consisting of a regular diet; Caffeine intake is rare.   Exercise - Walks and lifts occasionally; limited recently.   2) Preventative Exams / Immunizations:  Dental -- Due for exam  Vision -- Up to date   Health Maintenance  Topic Date Due  . Hepatitis C Screening  [birth year]  . FOOT EXAM  03/13/1959  . HEMOGLOBIN A1C  12/30/2015  . INFLUENZA VACCINE  11/06/2016  . COLONOSCOPY  11/11/2016  . OPHTHALMOLOGY EXAM  02/20/2017  . PNA vac Low Risk Adult (2 of 2 - PPSV23) 11/18/2018  . TETANUS/TDAP  07/31/2024     Immunization History  Administered Date(s) Administered  . Influenza Whole 01/06/2009  . Influenza, High Dose Seasonal PF 06/21/2015  . Influenza-Unspecified 01/06/2014  . Pneumococcal Conjugate-13 11/07/2014  . Pneumococcal Polysaccharide-23 11/17/2013  . Tdap 08/01/2014    RISK FACTORS  Tobacco History  Smoking Status  . Former Smoker  . Packs/day: 0.30  . Years: 1.00  . Types: Cigarettes  . Quit date: 04/08/1985  Smokeless Tobacco  . Never Used     Cardiac risk factors: advanced age (older than 48 for men, 68 for women), diabetes mellitus, dyslipidemia, hypertension, male gender and obesity (BMI >= 30 kg/m2).  Depression Screen  Depression screen [facility] 2/9 07/16/2016  Decreased Interest 0  Down, Depressed, Hopeless 0  PHQ - 2 Score 0     Activities of Daily Living In your present state of health, do you have any difficulty performing the following activities?:  Driving? No Managing money?  No Feeding yourself? No Getting from bed to chair? No Climbing a flight of stairs? No Preparing food and eating?:  No Bathing or showering? No Getting dressed: No Getting to the toilet? No Using the toilet: No Moving around from place to place: No In the past year have you fallen or had a near fall?:No   Home Safety Has smoke detector and wears seat belts. No excess sun exposure. Are there smokers in your home (other than you)?  No Do you feel safe at home?  Yes  Hearing Difficulties: No Do you often ask people to speak up or repeat themselves? No Do you experience ringing or noises in your ears? No  Do you have difficulty understanding soft or whispered voices? No    Cognitive Testing  Alert? Yes   Normal Appearance? Yes  Oriented to person? Yes  Place? Yes   Time? Yes  Recall of three objects?  Yes  Can perform simple calculations? Yes  Displays appropriate judgment? Yes  Can read the correct time from a watch face? Yes  Do you feel that you have a problem with memory? No  Do you often misplace items? No   Advanced Directives have been discussed with the patient? Yes   Current Physicians/Providers and Suppliers  1. Terri Piedra, FNP - Internal Medicine  Indicate any recent Medical Services you may have received from other than [facility] providers in the past year (date may be approximate).  All answers were reviewed with the patient and necessary referrals were made:  Mauricio Po, [place]   07/16/2016    No Known Allergies   Outpatient Medications Prior to Visit  Medication Sig Dispense Refill  . acetaminophen (TYLENOL) 325 MG tablet Per bottle as needed     . furosemide (LASIX) 20 MG tablet Take 1 tablet (20 mg total) by mouth daily. 90 tablet 3  . glimepiride (AMARYL) 2 MG tablet TAKE 1 TABLET BY MOUTH EVERY DAY 90 tablet 2  . metFORMIN (GLUCOPHAGE) 500 MG tablet TAKE 1 TABLET (500 MG TOTAL) BY MOUTH 2 (TWO) TIMES DAILY WITH A MEAL. 180 tablet 2  . minoxidil (LONITEN) 10 MG tablet TAKE 1 TABLET IN AM AND 1/2 TABLET IN PM 60 tablet 4  . sildenafil (VIAGRA) 100 MG tablet Take 1  tablet (100 mg total) by mouth daily as needed for erectile dysfunction. 9 tablet 0  . valsartan-hydrochlorothiazide (DIOVAN-HCT) 320-25 MG tablet TAKE 1 TABLET BY MOUTH DAILY. 90 tablet 2  . verapamil (CALAN-SR) 240 MG CR tablet TAKE 1 TABLET (240 MG TOTAL) BY MOUTH DAILY. 90 tablet 2  . levothyroxine (SYNTHROID, LEVOTHROID) 75 MCG tablet TAKE 1 TABLET (75 MCG TOTAL) BY MOUTH DAILY. 90 tablet 2  . omeprazole (PRILOSEC) 20 MG capsule TAKE 1 CAPSULE (20 MG TOTAL) BY MOUTH DAILY. 90 capsule 2  . furosemide (LASIX) 20 MG tablet TAKE 1 TABLET (20 MG TOTAL) BY MOUTH DAILY. 30 tablet 8  . glimepiride (AMARYL) 2 MG tablet Take 1 tablet (2 mg total) by mouth daily. 90 tablet 3   No facility-administered medications prior to visit.      Past Medical History:  Diagnosis Date  . Atypical chest pain    [facility] 08-03-01 nl coronaries and lv fn, lvedp 30.  cardiac workup by Dr. Harrington Challenger 5/09 neg cardiolite for ischemia, rec rsik reduction  . Chicken pox   . Diverticulosis    colonoscopy 9/01........Marland KitchenDr. Deatra Ina  . DM (diabetes mellitus) ([place])   . GERD (gastroesophageal reflux disease)   . Healthcare maintenance    pneumovax 09/2006, Td 04/2004, CPX June 12, 2010  . Hypertension   . Microcytic anemia   . Morbid obesity ([place])    all time high 310 2009.  target wt = 208 for BMI <30.  referred back to nutrition again June 12, 2010  . 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 Renal insufficiency    baseline 1.6 October 24, 2009 > 1.3 June 12, 2010  . Thyroid disease      Past Surgical History:  Procedure Laterality Date  . KNEE SURGERY       Family History  Problem Relation Age of Onset  . Diabetes Mother   . Alcohol abuse Father   . Throat cancer Brother     smoker  . Alcohol abuse Brother   . Colon cancer Neg Hx      Social History   Social History  . Marital status: Married    Spouse name: N/A  . Number of children: 2   . Years of education: 63   Occupational History  . Retired [employer]  . Smoking status: Former Smoker    Packs/day: 0.30    Years: 1.00    Types: Cigarettes    Quit date: 04/08/1985  . Smokeless tobacco: Never Used  . Alcohol use Yes     Comment: occasional  . Drug use: No  . Sexual activity: Not  on file   Other Topics Concern  . 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      Review of Systems  Constitutional: Denies fever, chills, fatigue, or significant weight gain/loss. HENT: Head: Denies headache or neck pain Ears: Denies changes in hearing, ringing in ears, earache, drainage Nose: Denies discharge, stuffiness, itching, nosebleed, sinus pain Throat: Denies sore throat, hoarseness, dry mouth, sores, thrush Eyes: Denies loss/changes in vision, pain, redness, blurry/double vision, flashing lights Cardiovascular: Denies chest pain/discomfort, tightness, palpitations, shortness of breath with activity, difficulty lying down, swelling, sudden awakening with shortness of breath Respiratory: Denies shortness of breath, cough, sputum production, wheezing Gastrointestinal: Denies dysphasia, heartburn, change in appetite, nausea, change in bowel habits, rectal bleeding, constipation, diarrhea, yellow skin or eyes Genitourinary: Denies frequency, urgency, burning/pain, blood in urine, incontinence, change in urinary strength. Musculoskeletal: Denies muscle/joint pain, stiffness, back pain, redness or swelling of joints, trauma Skin: Denies rashes, lumps, itching, dryness, color changes, or hair/nail changes Neurological: Denies dizziness, fainting, seizures, weakness, numbness, tingling, tremor Psychiatric - Denies nervousness, stress, depression or memory loss Endocrine: Denies heat  or cold intolerance, sweating, frequent urination, excessive thirst, changes in appetite Hematologic: Denies ease of bruising or bleeding    Objective:     BP (!) 178/86 (BP Location: Left Arm, Patient Position: Sitting, Cuff Size: Large)   Pulse 87   Temp 98.1 F (36.7 C) (Oral)   Resp 16   Ht 5\' 11"  (1.803 m)   Wt 285 lb (129.3 kg)   SpO2 97%   BMI 39.75 kg/m  Nursing note and vital signs reviewed.  Physical Exam  Constitutional: He is oriented to person, place, and time. He appears well-developed and well-nourished.  HENT:  Head: Normocephalic.  Right Ear: Hearing, tympanic membrane, external ear and ear canal normal.  Left Ear: Hearing, tympanic membrane, external ear and ear canal normal.  Nose: Nose normal.  Mouth/Throat: Uvula is midline, oropharynx is clear and moist and mucous membranes are normal.  Eyes: Conjunctivae and EOM are normal. Pupils are equal, round, and reactive to light.  Neck: Neck supple. No JVD present. No tracheal deviation present. No thyromegaly present.  Cardiovascular: Normal rate, regular rhythm, normal heart sounds and intact distal pulses.   Pulmonary/Chest: Effort normal and breath sounds normal.  Abdominal: Soft. Bowel sounds are normal. He exhibits no distension and no mass. There is no tenderness. There is no rebound and no guarding.  Musculoskeletal: Normal range of motion. He exhibits no edema or tenderness.  Lymphadenopathy:    He has no cervical adenopathy.  Neurological: He is alert and oriented to person, place, and time. He has normal reflexes. No cranial nerve deficit. He exhibits normal muscle tone. Coordination normal.  Skin: Skin is warm and dry.  Psychiatric: He has a normal mood and affect. His behavior is normal. Judgment and thought content normal.       Assessment & Plan:   During the course of the visit the patient was educated and counseled about appropriate screening and preventive services including:     Pneumococcal vaccine   Influenza vaccine  Prostate cancer screening  Colorectal cancer screening  Diabetes screening  Nutrition counseling   Diet review for nutrition referral? Yes ____  Not Indicated _X___   Patient Instructions (the written plan) was given to the  patient.  Medicare Attestation I have personally reviewed: The patient's medical and social history Their use of alcohol, tobacco or illicit drugs Their current medications and supplements The patient's functional ability including ADLs,fall risks, home safety risks, cognitive, and hearing and visual impairment Diet and physical activities Evidence for depression or mood disorders  The patient's weight, height, BMI,  have been recorded in the chart.  I have made referrals, counseling, and provided education to the patient based on review of the above and I have provided the patient with a written personalized care plan for preventive services.     Problem List Items Addressed This Visit      Cardiovascular and Mediastinum   Essential hypertension    Blood pressure elevated above goal of 140/90 with current medication regimen and no adverse side effects. He notes that hehas felt a little run down recently. Obtain stress test. Continue current dsoage of furosemide, valsaratan-hctz, verapamil and will add metoprolol. Encouraged her blood pressure at home and follow low-sodium diet. Continue to monitor.      Relevant Medications   metoprolol succinate (TOPROL-XL) 25 MG 24 hr tablet   Peripheral vascular disease (HCC)    Appears stable with risk factor management and monitoring. No cramping or other symptoms currently. Continue to monitor.       Relevant Medications   metoprolol succinate (TOPROL-XL) 25 MG 24 hr tablet     Respiratory   Obstructive sleep apnea    Maintained on sleep apnea and usage of CPAP. Recommend follow up with pulmonology for CPAP titration as needed as it may be a contributing factor  to his increased blood pressure. Continue to monitor.         Endocrine   Diabetes mellitus without complication (HCC)    Obtain hemoglobin A1c and urine microalbumin. Diabetic foot exam completed today. Maintained on valsartan for CAD risk reduction. Continue current dosage of metformin and glimepiride pending A1c results. Pneumovax is up-to-date. Continue to monitor.      Relevant Orders   Urine Microalbumin w/creat. ratio     Other   HYPERCHOLESTEROLEMIA    Obtain lipid profile. Not currently maintained on medication. Continue to monitor pending lipid profile results.       Relevant Medications   metoprolol succinate (TOPROL-XL) 25 MG 24 hr tablet   Morbid obesity (HCC)    BMI of 39.75. Recommend weight loss of 5-10% of current body weight. Recommend increasing physical activity to 30 minutes of moderate level activity daily. Encourage nutritional intake that focuses on nutrient dense foods and is moderate, varied, and balanced and is low in saturated fats and processed/sugary foods. Continue to monitor.        Routine general medical examination at a health care facility    1) Anticipatory Guidance: Discussed importance of wearing a seatbelt while driving and not texting while driving; changing batteries in smoke detector at least once annually; wearing suntan lotion when outside; eating a balanced and moderate diet; getting physical activity at least 30 minutes per day.  2) Immunizations / Screenings / Labs:  All immunizations are up to date per recommendations. Obtain Hepatitis C antibody for Hepatitis C screening. Obtain PSA for prostate cancer screening. Colon cancer screening is due in August of this year. All other screenings are up-to-date per recommendations. Obtain CBC, CMET, and lipid profile.   Overall well exam with risk factors for cardiovascular disease including hypertension, hyperlipidemia, obesity, obstructive sleep apnea, and type 2 diabetes. Chronic conditions  remain labile with blood  pressure being elevated today above goal 140/90. Most recent A1c of 6.9 indicates adequate control of diabetes. Maintained on medication regimens with no adverse side effects. Encouraged increased physical activity to 30 minutes of moderate level activity at least 2-3 days per week with goal steps of 10,000 steps. Continue other healthy lifestyle behaviors and choices. Follow-up prevention exam in 1 year. Follow-up office visit pending blood work and for chronic conditions.        Relevant Orders   CBC   Comprehensive metabolic panel   Lipid panel   PSA   Hemoglobin A1c   Hepatitis C antibody   TSH   Medicare annual wellness visit, subsequent - Primary    Reviewed and updated patient's medical, surgical, family and social history. Medications and allergies were also reviewed. Basic screenings for depression, activities of daily living, hearing, cognition and safety were performed. Provider list was updated and health plan was provided to the patient.           I am having Mr. Weckerly start on metoprolol succinate. I am also having him maintain his acetaminophen, furosemide, sildenafil, valsartan-hydrochlorothiazide, metFORMIN, verapamil, glimepiride, minoxidil, omeprazole, and levothyroxine.   Meds ordered this encounter  Medications  . omeprazole (PRILOSEC) 20 MG capsule    Sig: Take 1 capsule (20 mg total) by mouth daily.    Dispense:  90 capsule    Refill:  2  . levothyroxine (SYNTHROID, LEVOTHROID) 75 MCG tablet    Sig: Take 1 tablet (75 mcg total) by mouth daily.    Dispense:  30 tablet    Refill:  0  . metoprolol succinate (TOPROL-XL) 25 MG 24 hr tablet    Sig: Take 1 tablet (25 mg total) by mouth daily.    Dispense:  90 tablet    Refill:  3    Order Specific Question:   Supervising Provider    Answer:   Pricilla Holm A [[id number]]     Follow-up: Return in about 3 months (around 10/15/2016), or if symptoms worsen or fail to improve.   Mauricio Po, FNP

## 2016-07-16 NOTE — Assessment & Plan Note (Signed)
BMI of 39.75. Recommend weight loss of 5-10% of current body weight. Recommend increasing physical activity to 30 minutes of moderate level activity daily. Encourage nutritional intake that focuses on nutrient dense foods and is moderate, varied, and balanced and is low in saturated fats and processed/sugary foods. Continue to monitor.

## 2016-07-16 NOTE — Patient Instructions (Signed)
Thank you for choosing [facility].  SUMMARY AND INSTRUCTIONS:  Please follow up with Dr. [name] for your CPAP titration as needed.  They will call to schedule your stress test.  Recommend weight loss of 5-10% of current body weight through nutrition and physical activity.  Recommended caloric intake of 1500-1800 cal per day of nutrient dense foods including fruits and vegetables.  Start taking metoprolol daily.  Please monitor your blood pressure at home at least once per day at different times throughout the day. Record these numbers and bring to next office visit in 2 weeks.   Medication:  Your prescription(s) have been submitted to your pharmacy or been printed and provided for you. Please take as directed and contact our office if you believe you are having problem(s) with the medication(s) or have any questions.  Labs:  Please stop by the lab on the lower level of the building for your blood work. Your results will be released to [place] (or called to you) after review, usually within 72 hours after test completion. If any changes need to be made, you will be notified at that same time.  1.) The lab is open from 7:30am to 5:30 pm Monday-Friday 2.) No appointment is necessary 3.) Fasting (if needed) is 6-8 hours after food and drink; black coffee and water are okay   Follow up:  If your symptoms worsen or fail to improve, please contact our office for further instruction, or in case of emergency go directly to the emergency room at the closest medical facility.    Health Maintenance  Topic Date Due  . Hepatitis C Screening  [birth year]  . FOOT EXAM  03/13/1959  . HEMOGLOBIN A1C  12/30/2015  . INFLUENZA VACCINE  11/06/2016  . COLONOSCOPY  11/11/2016  . OPHTHALMOLOGY EXAM  02/20/2017  . PNA vac Low Risk Adult (2 of 2 - PPSV23) 11/18/2018  . TETANUS/TDAP  07/31/2024     Health Maintenance, Male A healthy lifestyle and preventive care is important for your health  and wellness. Ask your health care provider about what schedule of regular examinations is right for you. What should I know about weight and diet?  Eat a Healthy Diet  Eat plenty of vegetables, fruits, whole grains, low-fat dairy products, and lean protein.  Do not eat a lot of foods high in solid fats, added sugars, or salt. Maintain a Healthy Weight  Regular exercise can help you achieve or maintain a healthy weight. You should:  Do at least 150 minutes of exercise each week. The exercise should increase your heart rate and make you sweat (moderate-intensity exercise).  Do strength-training exercises at least twice a week. Watch Your Levels of Cholesterol and Blood Lipids  Have your blood tested for lipids and cholesterol every 5 years starting at 68 years of age. If you are at high risk for heart disease, you should start having your blood tested when you are 68 years old. You may need to have your cholesterol levels checked more often if:  Your lipid or cholesterol levels are high.  You are older than 68 years of age.  You are at high risk for heart disease. What should I know about cancer screening? Many types of cancers can be detected early and may often be prevented. Lung Cancer  You should be screened every year for lung cancer if:  You are a current smoker who has smoked for at least 30 years.  You are a former smoker who has quit within  the past 15 years.  Talk to your health care provider about your screening options, when you should start screening, and how often you should be screened. Colorectal Cancer  Routine colorectal cancer screening usually begins at 68 years of age and should be repeated every 5-10 years until you are 68 years old. You may need to be screened more often if early forms of precancerous polyps or small growths are found. Your health care provider may recommend screening at an earlier age if you have risk factors for colon cancer.  Your health  care provider may recommend using home test kits to check for hidden blood in the stool.  A small camera at the end of a tube can be used to examine your colon (sigmoidoscopy or colonoscopy). This checks for the earliest forms of colorectal cancer. Prostate and Testicular Cancer  Depending on your age and overall health, your health care provider may do certain tests to screen for prostate and testicular cancer.  Talk to your health care provider about any symptoms or concerns you have about testicular or prostate cancer. Skin Cancer  Check your skin from head to toe regularly.  Tell your health care provider about any new moles or changes in moles, especially if:  There is a change in a mole's size, shape, or color.  You have a mole that is larger than a pencil eraser.  Always use sunscreen. Apply sunscreen liberally and repeat throughout the day.  Protect yourself by wearing long sleeves, pants, a wide-brimmed hat, and sunglasses when outside. What should I know about heart disease, diabetes, and high blood pressure?  If you are 27-74 years of age, have your blood pressure checked every 3-5 years. If you are 27 years of age or older, have your blood pressure checked every year. You should have your blood pressure measured twice-once when you are at a hospital or clinic, and once when you are not at a hospital or clinic. Record the average of the two measurements. To check your blood pressure when you are not at a hospital or clinic, you can use:  An automated blood pressure machine at a pharmacy.  A home blood pressure monitor.  Talk to your health care provider about your target blood pressure.  If you are between 85-39 years old, ask your health care provider if you should take aspirin to prevent heart disease.  Have regular diabetes screenings by checking your fasting blood sugar level.  If you are at a normal weight and have a low risk for diabetes, have this test once every  three years after the age of 32.  If you are overweight and have a high risk for diabetes, consider being tested at a younger age or more often.  A one-time screening for abdominal aortic aneurysm (AAA) by ultrasound is recommended for men aged 47-75 years who are current or former smokers. What should I know about preventing infection? Hepatitis B  If you have a higher risk for hepatitis B, you should be screened for this virus. Talk with your health care provider to find out if you are at risk for hepatitis B infection. Hepatitis C  Blood testing is recommended for:  Everyone born from [birth year] through 1967.  Anyone with known risk factors for hepatitis C. Sexually Transmitted Diseases (STDs)  You should be screened each year for STDs including gonorrhea and chlamydia if:  You are sexually active and are younger than 68 years of age.  You are older than 68  years of age and your health care provider tells you that you are at risk for this type of infection.  Your sexual activity has changed since you were last screened and you are at an increased risk for chlamydia or gonorrhea. Ask your health care provider if you are at risk.  Talk with your health care provider about whether you are at high risk of being infected with HIV. Your health care provider may recommend a prescription medicine to help prevent HIV infection. What else can I do?  Schedule regular health, dental, and eye exams.  Stay current with your vaccines (immunizations).  Do not use any tobacco products, such as cigarettes, chewing tobacco, and e-cigarettes. If you need help quitting, ask your health care provider.  Limit alcohol intake to no more than 2 drinks per day. One drink equals 12 ounces of beer, 5 ounces of wine, or 1 ounces of hard liquor.  Do not use street drugs.  Do not share needles.  Ask your health care provider for help if you need support or information about quitting drugs.  Tell your health  care provider if you often feel depressed.  Tell your health care provider if you have ever been abused or do not feel safe at home. This information is not intended to replace advice given to you by your health care provider. Make sure you discuss any questions you have with your health care provider. Document Released: 09/21/2007 Document Revised: 11/22/2015 Document Reviewed: 12/27/2014 Elsevier Interactive Patient Education  2017 [employer].

## 2016-07-17 ENCOUNTER — Other Ambulatory Visit (INDEPENDENT_AMBULATORY_CARE_PROVIDER_SITE_OTHER): Payer: Medicare Other

## 2016-07-17 DIAGNOSIS — Z125 Encounter for screening for malignant neoplasm of prostate: Secondary | ICD-10-CM

## 2016-07-17 DIAGNOSIS — Z0000 Encounter for general adult medical examination without abnormal findings: Secondary | ICD-10-CM | POA: Diagnosis not present

## 2016-07-17 DIAGNOSIS — E119 Type 2 diabetes mellitus without complications: Secondary | ICD-10-CM | POA: Diagnosis not present

## 2016-07-17 LAB — COMPREHENSIVE METABOLIC PANEL
ALT: 25 U/L (ref 0–53)
AST: 22 U/L (ref 0–37)
Albumin: 4.7 g/dL (ref 3.5–5.2)
Alkaline Phosphatase: 70 U/L (ref 39–117)
BILIRUBIN TOTAL: 0.9 mg/dL (ref 0.2–1.2)
BUN: 20 mg/dL (ref 6–23)
CHLORIDE: 99 meq/L (ref 96–112)
CO2: 31 meq/L (ref 19–32)
Calcium: 10.8 mg/dL — ABNORMAL HIGH (ref 8.4–10.5)
Creatinine, Ser: 1.47 mg/dL (ref 0.40–1.50)
GFR: 61.39 mL/min (ref >60.00)
GLUCOSE: 75 mg/dL (ref 70–99)
Potassium: 3.5 mEq/L (ref 3.5–5.1)
Sodium: 139 mEq/L (ref 135–145)
Total Protein: 8 g/dL (ref 6.0–8.3)

## 2016-07-17 LAB — CBC
HCT: 41.6 % (ref 39.0–52.0)
HEMOGLOBIN: 14.1 g/dL (ref 13.0–17.0)
MCHC: 34 g/dL (ref 30.0–36.0)
MCV: 74.1 fl — AB (ref 78.0–100.0)
Platelets: 270 10*3/uL (ref 150.0–400.0)
RBC: 5.61 Mil/uL (ref 4.22–5.81)
RDW: 15.7 % — AB (ref 11.5–15.5)
WBC: 9.5 10*3/uL (ref 4.0–10.5)

## 2016-07-17 LAB — LIPID PANEL
CHOL/HDL RATIO: 5
Cholesterol: 202 mg/dL — ABNORMAL HIGH (ref 0–200)
HDL: 44.6 mg/dL (ref >39.00)
LDL CALC: 141 mg/dL — AB (ref 0–99)
NONHDL: 157.75
TRIGLYCERIDES: 85 mg/dL (ref 0.0–149.0)
VLDL: 17 mg/dL (ref 0.0–40.0)

## 2016-07-17 LAB — HEMOGLOBIN A1C: Hgb A1c MFr Bld: 7 % — ABNORMAL HIGH (ref 4.6–6.5)

## 2016-07-17 LAB — TSH: TSH: 3.45 u[IU]/mL (ref 0.35–4.50)

## 2016-07-17 LAB — PSA: PSA: 0.25 ng/mL (ref 0.10–4.00)

## 2016-07-17 LAB — MICROALBUMIN / CREATININE URINE RATIO
Creatinine,U: 73.9 mg/dL
Microalb Creat Ratio: 0.9 mg/g (ref 0.0–30.0)
Microalb, Ur: <0.7 mg/dL (ref 0.0–1.9)

## 2016-07-18 LAB — HEPATITIS C ANTIBODY: HCV Ab: NEGATIVE

## 2016-07-19 ENCOUNTER — Other Ambulatory Visit: Payer: Self-pay

## 2016-07-19 MED ORDER — ROSUVASTATIN CALCIUM 10 MG PO TABS: 10.0000 mg | ORAL_TABLET | Freq: Every day | ORAL | 1 refills | Status: DC

## 2016-07-25 ENCOUNTER — Telehealth (HOSPITAL_COMMUNITY): Payer: Self-pay

## 2016-07-25 NOTE — Telephone Encounter (Signed)
Encounter complete. 

## 2016-07-29 ENCOUNTER — Ambulatory Visit (INDEPENDENT_AMBULATORY_CARE_PROVIDER_SITE_OTHER): Payer: Medicare Other | Admitting: Adult Health

## 2016-07-29 ENCOUNTER — Encounter: Payer: Self-pay | Admitting: Adult Health

## 2016-07-29 VITALS — BP 136/76 | HR 82 | Ht 71.0 in | Wt 287.8 lb

## 2016-07-29 DIAGNOSIS — G4733 Obstructive sleep apnea (adult) (pediatric): Secondary | ICD-10-CM | POA: Diagnosis not present

## 2016-07-29 NOTE — Assessment & Plan Note (Signed)
OSA controlled on CPAP , encouraged on compliance .   Plan  Patient Instructions  Wear CPAP At bedtime   Goal is to wear each night for at least 4-6hr.  Work on weight loss,  Follow up with Dr. [name]  In 1 year and As needed

## 2016-07-29 NOTE — Assessment & Plan Note (Signed)
Wt loss encouraged  

## 2016-07-29 NOTE — Addendum Note (Signed)
Addended by: Parke Poisson E on: 07/29/2016 11:31 AM   Modules accepted: Orders

## 2016-07-29 NOTE — Progress Notes (Signed)
@Patient  ID: Matthew Keller, male    DOB: 03-11-1951, 66 y.o.   MRN: [id number]  Chief Complaint  Patient presents with  . Follow-up    OSA    Referring provider: Golden Circle, FNP  HPI: 66yo male followed for OSA  Has DM/HTN /Hyperlipidemia/Obesity  Drives for Holiday tours  TEST  PSG showed obstructive sleep apnea with AHI 12/h, increases during REM to 50/h, lowest desaturation 75% c/w severe OSA.    07/29/2016 Follow up : OSA  Patient presents for a yearly follow-up for sleep apnea. Patient says he has been doing okay on his C Pap machine. 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  Says he uses his machine most nights. Gets in about 4-5 hours each night.. Feels rested with no daytime sleepiness. Download shows 73% compliance with average usage at 5 hours. AHI 1.6. Patient remains on a set pressure and 11 cm of H2O.  Says he exercises at gym, trying to eat better. Wt is down few lbs.      No Known Allergies  Immunization History  Administered Date(s) Administered  . Influenza Whole 01/06/2009  . Influenza, High Dose Seasonal PF 06/21/2015  . Influenza-Unspecified 01/06/2014  . Pneumococcal Conjugate-13 11/07/2014  . Pneumococcal Polysaccharide-23 11/17/2013  . Tdap 08/01/2014    Past Medical History:  Diagnosis Date  . Atypical chest pain    LHC 08-03-01 nl coronaries and lv fn, lvedp 30.  cardiac workup by Dr. Harrington Challenger 5/09 neg cardiolite for ischemia, rec rsik reduction  . Chicken pox   . Diverticulosis    colonoscopy 9/01........Marland KitchenDr. Deatra Ina  . DM (diabetes mellitus) ([place])   . GERD (gastroesophageal reflux disease)   . Healthcare maintenance    pneumovax 09/2006, Td 04/2004, CPX June 12, 2010  . Hypertension   . Microcytic anemia   . Morbid obesity ([place])    all time high 310 2009.  target wt = 208 for BMI <30.  referred back to nutrition again June 12, 2010  . 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 Renal insufficiency    baseline 1.6 October 24, 2009 > 1.3 June 12, 2010  . Thyroid disease     Tobacco History: History  Smoking Status  . Former Smoker  . Packs/day: 0.30  . Years: 1.00  . Types: Cigarettes  . Quit date: 04/08/1985  Smokeless Tobacco  . Never Used   Counseling given: Not Answered   Outpatient Encounter Prescriptions as of 07/29/2016  Medication Sig  . acetaminophen (TYLENOL) 325 MG tablet Per bottle as needed   . furosemide (LASIX) 20 MG tablet Take 1 tablet (20 mg total) by mouth daily.  [name] glimepiride (AMARYL) 2 MG tablet TAKE 1 TABLET BY MOUTH EVERY DAY  . levothyroxine (SYNTHROID, LEVOTHROID) 75 MCG tablet Take 1 tablet (75 mcg total) by mouth daily.  . metFORMIN (GLUCOPHAGE) 500 MG tablet TAKE 1 TABLET (500 MG TOTAL) BY MOUTH 2 (TWO) TIMES DAILY WITH A MEAL.  . metoprolol succinate (TOPROL-XL) 25 MG 24 hr tablet Take 1 tablet (25 mg total) by mouth daily.  . minoxidil (LONITEN) 10 MG tablet TAKE 1 TABLET IN AM AND 1/2 TABLET IN PM  . omeprazole (PRILOSEC) 20 MG capsule Take 1 capsule (20 mg total) by mouth daily.  . rosuvastatin (CRESTOR) 10 MG tablet Take 1 tablet (10 mg total) by mouth daily.  . sildenafil (VIAGRA)  100 MG tablet Take 1 tablet (100 mg total) by mouth daily as needed for erectile dysfunction.  . valsartan-hydrochlorothiazide (DIOVAN-HCT) 320-25 MG tablet TAKE 1 TABLET BY MOUTH DAILY.  . verapamil (CALAN-SR) 240 MG CR tablet TAKE 1 TABLET (240 MG TOTAL) BY MOUTH DAILY.   No facility-administered encounter medications on file as of 07/29/2016.      Review of Systems  Constitutional:   No  weight loss, night sweats,  Fevers, chills, fatigue, or  lassitude.  HEENT:   No headaches,  Difficulty swallowing,  Tooth/dental problems, or  Sore throat,                No sneezing, itching, ear ache, nasal congestion, post nasal drip,   CV:  No chest pain,  Orthopnea, PND, swelling in lower  extremities, anasarca, dizziness, palpitations, syncope.   GI  No heartburn, indigestion, abdominal pain, nausea, vomiting, diarrhea, change in bowel habits, loss of appetite, bloody stools.   Resp: No shortness of breath with exertion or at rest.  No excess mucus, no productive cough,  No non-productive cough,  No coughing up of blood.  No change in color of mucus.  No wheezing.  No chest wall deformity  Skin: no rash or lesions.  GU: no dysuria, change in color of urine, no urgency or frequency.  No flank pain, no hematuria   MS:  No joint pain or swelling.  No decreased range of motion.  No back pain.    Physical Exam  BP 136/76 (BP Location: Left Arm, Cuff Size: Large)   Pulse 82   Ht 5\' 11"  (1.803 m)   Wt 287 lb 12.8 oz (130.5 kg)   SpO2 96%   BMI 40.14 kg/m   GEN: A/Ox3; pleasant , NAD, obese    HEENT:  [place]/AT,  EACs-clear, TMs-wnl, NOSE-clear, THROAT-clear, no lesions, no postnasal drip or exudate noted. Class 2-3 MP airway   NECK:  Supple w/ fair ROM; no JVD; normal carotid impulses w/o bruits; no thyromegaly or nodules palpated; no lymphadenopathy.    RESP  Clear  P & A; w/o, wheezes/ rales/ or rhonchi. no accessory muscle use, no dullness to percussion  CARD:  RRR, no m/r/g, no peripheral edema, pulses intact, no cyanosis or clubbing.  GI:   Soft & nt; nml bowel sounds; no organomegaly or masses detected.   Musco: Warm bil, no deformities or joint swelling noted.   Neuro: alert, no focal deficits noted.    Skin: Warm, no lesions or rashes    Lab Results:   BNP  Imaging: No results found.   Assessment & Plan:   Obstructive sleep apnea OSA controlled on CPAP , encouraged on compliance .   Plan  Patient Instructions  Wear CPAP At bedtime   Goal is to wear each night for at least 4-6hr.  Work on weight loss,  Follow up with Dr. [name]  In 1 year and As needed       Morbid obesity Wt loss encouraged.      Rexene Edison, NP 07/29/2016

## 2016-07-29 NOTE — Patient Instructions (Signed)
Wear CPAP At bedtime   Goal is to wear each night for at least 4-6hr.  Work on weight loss,  Follow up with Dr. [name]  In 1 year and As needed

## 2016-07-30 ENCOUNTER — Other Ambulatory Visit: Payer: Self-pay | Admitting: Cardiovascular Disease

## 2016-07-30 ENCOUNTER — Ambulatory Visit (HOSPITAL_COMMUNITY)
Admission: RE | Admit: 2016-07-30 | Discharge: 2016-07-30 | Disposition: A | Discharge status: Home / Self Care | Payer: Medicare Other | Source: Ambulatory Visit | Attending: Cardiovascular Disease | Admitting: Cardiovascular Disease

## 2016-07-30 DIAGNOSIS — E7800 Pure hypercholesterolemia, unspecified: Secondary | ICD-10-CM

## 2016-07-30 DIAGNOSIS — I739 Peripheral vascular disease, unspecified: Secondary | ICD-10-CM

## 2016-07-30 DIAGNOSIS — I10 Essential (primary) hypertension: Secondary | ICD-10-CM

## 2016-07-30 DIAGNOSIS — R9431 Abnormal electrocardiogram [ECG] [EKG]: Secondary | ICD-10-CM

## 2016-08-05 ENCOUNTER — Other Ambulatory Visit: Payer: Self-pay | Admitting: *Deleted

## 2016-08-05 MED ORDER — ROSUVASTATIN CALCIUM 10 MG PO TABS: 10.0000 mg | ORAL_TABLET | Freq: Every day | ORAL | 1 refills | Status: DC

## 2016-08-05 NOTE — Progress Notes (Signed)
Reviewed & agree with plan  

## 2016-08-09 ENCOUNTER — Telehealth: Payer: Self-pay | Admitting: Family

## 2016-08-09 NOTE — Telephone Encounter (Signed)
Patient states Matthew Keller referred him for a stress test but cardiology was not sure why Matthew Keller referred him for this.  States they did not perform this and sent him home until they could find out what Matthew Keller was wanting to have accomplished by the test. States he was told a blockage would not be found by a stress test.  Patient states it has been a week and he has not heard anything back.  Patient is a little concerned b/c his bp is still running high.  Would like a call in regard.  Lovena Le and Cecille Rubin can you assist this patient?

## 2016-08-09 NOTE — Telephone Encounter (Signed)
Spoke with patient and let him know that someone will contact him Monday morning to get something scheduled for the lexiscan. Pt understood.

## 2016-08-09 NOTE — Telephone Encounter (Signed)
Error

## 2016-08-09 NOTE — Telephone Encounter (Signed)
Noted  

## 2016-08-09 NOTE — Telephone Encounter (Signed)
Spoke with Cardiology.  States patient should have got scheduled for a lexiscan before he left the office.  States schedule is out today but will send message over to reach out to patient on Monday.  Did call patient back with this message.  Informed patient to give Cardiology or our office a call if he does not hear anything on Monday.

## 2016-08-09 NOTE — Telephone Encounter (Signed)
Please inform patient that we are doing the test because of his increased levels of fatigue and feeling run down to ensure it is not a progression of cardiovascular disease causing his symptoms. I spoke with Dr. Sallyanne Kuster who was going to change the test to a myocardial perfusion study, which is the last I heard.

## 2016-08-13 ENCOUNTER — Ambulatory Visit (HOSPITAL_COMMUNITY)
Admission: RE | Admit: 2016-08-13 | Discharge: 2016-08-13 | Disposition: A | Discharge status: Home / Self Care | Payer: Medicare Other | Source: Ambulatory Visit | Attending: Cardiology | Admitting: Cardiology

## 2016-08-13 ENCOUNTER — Telehealth: Payer: Self-pay | Admitting: Adult Health

## 2016-08-13 DIAGNOSIS — R9431 Abnormal electrocardiogram [ECG] [EKG]: Secondary | ICD-10-CM

## 2016-08-13 NOTE — Telephone Encounter (Signed)
Patient stopped by office to give address to [facility]: [address], [place], [place] [address]. PH: [phone] Fax: [phone]

## 2016-08-13 NOTE — Telephone Encounter (Signed)
Called and spoke with pt and he is aware of last ov note that has been faxed to the [facility]>

## 2016-08-14 ENCOUNTER — Inpatient Hospital Stay (HOSPITAL_COMMUNITY): Admission: RE | Admit: 2016-08-14 | Payer: Medicare Other | Source: Ambulatory Visit

## 2016-08-16 ENCOUNTER — Telehealth (HOSPITAL_COMMUNITY): Payer: Self-pay

## 2016-08-16 NOTE — Telephone Encounter (Signed)
Encounter complete. 

## 2016-08-21 ENCOUNTER — Ambulatory Visit (HOSPITAL_COMMUNITY)
Admission: RE | Admit: 2016-08-21 | Discharge: 2016-08-21 | Disposition: A | Discharge status: Home / Self Care | Payer: Medicare Other | Source: Ambulatory Visit | Attending: Cardiovascular Disease | Admitting: Cardiovascular Disease

## 2016-08-21 DIAGNOSIS — R9431 Abnormal electrocardiogram [ECG] [EKG]: Secondary | ICD-10-CM | POA: Insufficient documentation

## 2016-08-21 MED ORDER — REGADENOSON 0.4 MG/5ML IV SOLN
0.4000 mg | Freq: Once | INTRAVENOUS | Status: AC
  Administered 2016-08-21: 0.4 mg via INTRAVENOUS

## 2016-08-21 MED ORDER — TECHNETIUM TC 99M TETROFOSMIN IV KIT
28.9000 | PACK | Freq: Once | INTRAVENOUS | Status: AC | PRN
  Administered 2016-08-21: 28.9 via INTRAVENOUS
  Filled 2016-08-21: qty 29

## 2016-08-22 ENCOUNTER — Ambulatory Visit (HOSPITAL_COMMUNITY)
Admission: RE | Admit: 2016-08-22 | Discharge: 2016-08-22 | Disposition: A | Discharge status: Home / Self Care | Payer: Medicare Other | Source: Ambulatory Visit | Attending: Cardiology | Admitting: Cardiology

## 2016-08-22 LAB — MYOCARDIAL PERFUSION IMAGING
CHL CUP NUCLEAR SRS: 1
CHL CUP NUCLEAR SSS: 5
CSEPPHR: 105 {beats}/min
LV dias vol: 155 mL (ref 62–150)
LV sys vol: 73 mL
Rest HR: 71 {beats}/min
SDS: 4
TID: 1.13

## 2016-08-22 MED ORDER — TECHNETIUM TC 99M TETROFOSMIN IV KIT
29.7000 | PACK | Freq: Once | INTRAVENOUS | Status: AC | PRN
  Administered 2016-08-22: 29.7 via INTRAVENOUS

## 2016-09-05 ENCOUNTER — Ambulatory Visit: Payer: Medicare Other | Admitting: Cardiovascular Disease

## 2016-10-01 ENCOUNTER — Encounter: Payer: Self-pay | Admitting: Cardiology

## 2016-10-01 ENCOUNTER — Ambulatory Visit (INDEPENDENT_AMBULATORY_CARE_PROVIDER_SITE_OTHER): Payer: Medicare Other | Admitting: Cardiology

## 2016-10-01 VITALS — BP 160/90 | HR 72 | Ht 71.0 in | Wt 291.0 lb

## 2016-10-01 DIAGNOSIS — I2510 Atherosclerotic heart disease of native coronary artery without angina pectoris: Secondary | ICD-10-CM | POA: Insufficient documentation

## 2016-10-01 DIAGNOSIS — G4733 Obstructive sleep apnea (adult) (pediatric): Secondary | ICD-10-CM | POA: Diagnosis not present

## 2016-10-01 DIAGNOSIS — E785 Hyperlipidemia, unspecified: Secondary | ICD-10-CM | POA: Diagnosis not present

## 2016-10-01 DIAGNOSIS — E119 Type 2 diabetes mellitus without complications: Secondary | ICD-10-CM

## 2016-10-01 DIAGNOSIS — R9430 Abnormal result of cardiovascular function study, unspecified: Secondary | ICD-10-CM

## 2016-10-01 DIAGNOSIS — I10 Essential (primary) hypertension: Secondary | ICD-10-CM | POA: Diagnosis not present

## 2016-10-01 NOTE — Assessment & Plan Note (Signed)
Controlled.  

## 2016-10-01 NOTE — Assessment & Plan Note (Signed)
Just started statin Rx- LDL was 140

## 2016-10-01 NOTE — Assessment & Plan Note (Signed)
Pt in the office today to discuss findings on abnormal Myoview- "mild ischemia, low risk"

## 2016-10-01 NOTE — Assessment & Plan Note (Signed)
Non obstructive CAD at cath in 2003- 30% dLAD, 20% mRCA

## 2016-10-01 NOTE — Assessment & Plan Note (Signed)
On C-pap 

## 2016-10-01 NOTE — Patient Instructions (Signed)
Medication Instructions:  START- Aspirin 81 mg daily  Labwork: None ordered  Testing/Procedures: None Ordered  Follow-Up: Your physician recommends that you schedule a follow-up appointment in: 3 Months with Dr Sallyanne Kuster   Any Other Special Instructions Will Be Listed Below (If Applicable).   If you need a refill on your cardiac medications before your next appointment, please call your pharmacy.

## 2016-10-01 NOTE — Progress Notes (Signed)
10/01/2016 Matthew Keller   November 04, 1950  [id number]  Primary Physician Matthew Circle, FNP Primary Cardiologist: Dr Matthew Keller  HPI:  66 y/o obese AA male, former college athlete, with a history of DM, HTN, HLD, obesity, sleep apnea, and mild CAD by remote cath. The pt wanted to start a vigorous exercise program to try and loose wgt. His PCP referred him to [place] for further evaluation. The pt denies any chest pain but admitted to some exertional dyspnea. He felt he was more SOB than he should be going up steps. A Lexiscan Myoview was done 08/22/16. This was read as a low risk nuclear study. Dr Matthew Keller felt there was no reason to perform additional testing, but the study did suggest he may have some mild and limited coronary blockages. He recommended treatment of risk factors: A1c<7%, LDL<70, weight loss, exercise, and healthy diet. He also wanted him to come to the office to discuss the test results and he is seeing me today.  The pt continues to deny any exertional chest pain. We had a long talk about his Myoview results which I explained were OK but not really normal. I suggested he start walking or riding a stationary bike for exercise and let us know how he does. 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    Current Outpatient Prescriptions  Medication Sig Dispense Refill  . acetaminophen (TYLENOL) 325 MG tablet Per bottle as needed     . furosemide (LASIX) 20 MG tablet Take 1 tablet (20 mg total) by mouth daily. 90 tablet 3  . glimepiride (AMARYL) 2 MG tablet TAKE 1 TABLET BY MOUTH EVERY DAY 90 tablet 2  . levothyroxine (SYNTHROID, LEVOTHROID) 75 MCG tablet Take 1 tablet (75 mcg total) by mouth daily. 30 tablet 0  . metFORMIN (GLUCOPHAGE) 500 MG tablet TAKE 1 TABLET (500 MG TOTAL) BY MOUTH 2 (TWO) TIMES DAILY WITH A MEAL. 180 tablet 2  . metoprolol succinate (TOPROL-XL) 25 MG 24 hr tablet Take 1 tablet (25 mg total) by mouth daily. 90 tablet 3  .  minoxidil (LONITEN) 10 MG tablet TAKE 1 TABLET IN AM AND 1/2 TABLET IN PM 60 tablet 4  . omeprazole (PRILOSEC) 20 MG capsule Take 1 capsule (20 mg total) by mouth daily. 90 capsule 2  . rosuvastatin (CRESTOR) 10 MG tablet Take 1 tablet (10 mg total) by mouth daily. 90 tablet 1  . sildenafil (VIAGRA) 100 MG tablet Take 1 tablet (100 mg total) by mouth daily as needed for erectile dysfunction. 9 tablet 0  . valsartan-hydrochlorothiazide (DIOVAN-HCT) 320-25 MG tablet TAKE 1 TABLET BY MOUTH DAILY. 90 tablet 2  . verapamil (CALAN-SR) 240 MG CR tablet TAKE 1 TABLET (240 MG TOTAL) BY MOUTH DAILY. 90 tablet 2   No current facility-administered medications for this visit.     No Known Allergies  Past Medical History:  Diagnosis Date  . Atypical chest pain    LHC 08-03-01 nl coronaries and lv fn, lvedp 30.  cardiac workup by Dr. Harrington Keller 5/09 neg cardiolite for ischemia, rec rsik reduction  . Chicken pox   . Diverticulosis    colonoscopy 9/01........Marland KitchenDr. Deatra Keller  . DM (diabetes mellitus) (Matthew Keller)   . GERD (gastroesophageal reflux disease)   . Healthcare maintenance    pneumovax 09/2006, Td 04/2004, CPX June 12, 2010  . Hypertension   . Microcytic anemia   . Morbid obesity ([place])    all time high  310 2009.  target wt = 208 for BMI <30.  referred back to nutrition again June 12, 2010  . 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 Renal insufficiency    baseline 1.6 October 24, 2009 > 1.3 June 12, 2010  . Thyroid disease     Social History   Social History  . Marital status: Married    Spouse name: N/A  . Number of children: 2  . Years of education: 88   Occupational History  . Retired Matthew Keller Topics  . Smoking status: Former Smoker    Packs/day: 0.30    Years: 1.00    Types: Cigarettes    Quit date: 04/08/1985  . Smokeless tobacco: Never Used  . Alcohol use Yes     Comment: occasional  .  Drug use: No  . Sexual activity: Not on file   Other Topics Concern  . 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      Family History  Problem Relation Age of Onset  . Diabetes Mother   . Alcohol abuse Father   . Throat cancer Brother        smoker  . Alcohol abuse Brother   . Colon cancer Neg Hx      Review of Systems: General: negative for chills, fever, night sweats or weight changes.  Cardiovascular: negative for chest pain, dyspnea on exertion, edema, orthopnea, palpitations, paroxysmal nocturnal dyspnea or shortness of breath Dermatological: negative for rash Respiratory: negative for cough or wheezing Urologic: negative for hematuria Abdominal: negative for nausea, vomiting, diarrhea, bright red blood per rectum, melena, or hematemesis Neurologic: negative for visual changes, syncope, or dizziness All other systems reviewed and are otherwise negative except as noted above.    Blood pressure (!) 160/90, pulse 72, height 5\' 11"  (1.803 m), weight 291 lb (132 kg).  General appearance: alert, cooperative, no distress and morbidly obese Neck: no carotid bruit and no JVD Lungs: clear to auscultation bilaterally Heart: regular rate and rhythm Abdomen: obese non tender Extremities: extremities normal, atraumatic, no cyanosis or edema Pulses: 2+ and symmetric Skin: Skin color, texture, turgor normal. No rashes or lesions Neurologic: Grossly normal  EKG NSR, TWI 1,2,F, V4-V6- LVH  ASSESSMENT AND PLAN:   Abnormal cardiovascular function study Pt in the office today to discuss findings on abnormal Myoview- "mild ischemia, low risk"  Non-insulin treated type 2 diabetes mellitus (Matthew Keller) Followed as Primary Care on oral agents  Dyslipidemia Just started statin Rx- LDL was 140  Essential  hypertension Controlled  Morbid obesity BMI 40  Obstructive sleep apnea On C-pap  CAD (coronary artery disease) Non obstructive CAD at cath in 2003- 30% dLAD, 20% mRCA  PLAN  Start ASA 81 mg daily. OK to start non impact reasonable exercise program- walking or stationary bike. He should f/u with Dr Matthew Keller in 3 months or so. If he notes any unusual dyspnea he'll let us know. We may want to consider an echo in the future with LVH noted on echo.   Kerin Ransom PA-C 10/01/2016 4:04 PM

## 2016-10-01 NOTE — Assessment & Plan Note (Signed)
BMI 40 

## 2016-10-01 NOTE — Assessment & Plan Note (Signed)
Followed as Primary Care on oral agents

## 2016-10-10 ENCOUNTER — Other Ambulatory Visit: Payer: Self-pay | Admitting: Family

## 2016-11-30 ENCOUNTER — Other Ambulatory Visit: Payer: Self-pay | Admitting: Family

## 2016-12-07 ENCOUNTER — Other Ambulatory Visit: Payer: Self-pay | Admitting: Family

## 2016-12-25 ENCOUNTER — Other Ambulatory Visit: Payer: Self-pay | Admitting: Family

## 2017-01-02 ENCOUNTER — Encounter: Payer: Self-pay | Admitting: Cardiovascular Disease

## 2017-01-02 ENCOUNTER — Ambulatory Visit (INDEPENDENT_AMBULATORY_CARE_PROVIDER_SITE_OTHER): Payer: Medicare Other | Admitting: Cardiovascular Disease

## 2017-01-02 VITALS — BP 120/82 | HR 74 | Ht 71.0 in | Wt 290.0 lb

## 2017-01-02 DIAGNOSIS — E785 Hyperlipidemia, unspecified: Secondary | ICD-10-CM | POA: Diagnosis not present

## 2017-01-02 DIAGNOSIS — E039 Hypothyroidism, unspecified: Secondary | ICD-10-CM | POA: Diagnosis not present

## 2017-01-02 DIAGNOSIS — E119 Type 2 diabetes mellitus without complications: Secondary | ICD-10-CM

## 2017-01-02 DIAGNOSIS — I10 Essential (primary) hypertension: Secondary | ICD-10-CM | POA: Diagnosis not present

## 2017-01-02 DIAGNOSIS — I2510 Atherosclerotic heart disease of native coronary artery without angina pectoris: Secondary | ICD-10-CM | POA: Diagnosis not present

## 2017-01-02 DIAGNOSIS — Z79899 Other long term (current) drug therapy: Secondary | ICD-10-CM

## 2017-01-02 NOTE — Progress Notes (Signed)
Cardiology Office Note:    Date:  01/02/2017   ID:  [name], DOB 16-Sep-1950, MRN [id number]  PCP:  Golden Circle, FNP  Cardiologist:  Sanda Klein, MD    Referring MD: Golden Circle, FNP   Chief Complaint  Patient presents with  . Follow-up    Abnormal nuclear stress test, multiple coronary risk factors     History of Present Illness:    Matthew Keller is a 66 y.o. 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 In anticipation of increased activity level he underwent a stress test that showed a very small area of mild ischemia in the inferior wall. He does not have angina pectoris and he has normal left ventricular systolic function.  He has returned to physical exercise and as long as he keeps it up on a regular basis he feels great. Unfortunately sometimes his work interferes with his regular exercise. He finds it hard to get the motivation to start working out again. He inquires about bariatric surgery. His daughter had successful bariatric surgery couple of years ago and has done great.  He denies exertional dyspnea or angina. He does not have leg edema, claudication, palpitations, syncope or focal neurological complaints. Labs performed in April showed a hemoglobin A1c of 7%, LDL cholesterol of 141, normal triglycerides and HDL. Creatinine is 1.47, but he is very wide framed and muscular, in addition to being morbidly obese.  Past Medical History:  Diagnosis Date  . Atypical chest pain    LHC 08-03-01 nl coronaries and lv fn, lvedp 30.  cardiac workup by Dr. Harrington Challenger 5/09 neg cardiolite for ischemia, rec rsik reduction  . Chicken pox   . Diverticulosis    colonoscopy 9/01........Marland KitchenDr. Deatra Ina  . DM (diabetes mellitus) ([place])   . GERD (gastroesophageal reflux disease)   . Healthcare maintenance    pneumovax 09/2006, Td 04/2004, CPX June 12, 2010    . Hypertension   . Microcytic anemia   . Morbid obesity ([place])    all time high 310 2009.  target wt = 208 for BMI <30.  referred back to nutrition again June 12, 2010  . 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 Renal insufficiency    baseline 1.6 October 24, 2009 > 1.3 June 12, 2010  . Thyroid disease     Past Surgical History:  Procedure Laterality Date  . KNEE SURGERY      Current Medications: Current Meds  Medication Sig  . acetaminophen (TYLENOL) 325 MG tablet Per bottle as needed   . furosemide (LASIX) 20 MG tablet Take 1 tablet (20 mg total) by mouth daily.  . furosemide (LASIX) 20 MG tablet TAKE 1 TABLET (20 MG TOTAL) BY MOUTH DAILY.  [name] glimepiride (AMARYL) 2 MG tablet TAKE 1 TABLET BY MOUTH EVERY DAY  . levothyroxine (SYNTHROID, LEVOTHROID) 75 MCG tablet Take 1 tablet (75 mcg total) by mouth daily.  . metFORMIN (GLUCOPHAGE) 500 MG tablet TAKE 1 TABLET BY MOUTH TWICE A DAY WITH A MEAL  . metoprolol succinate (TOPROL-XL) 25 MG 24 hr tablet Take 1 tablet (25 mg total) by mouth daily.  . minoxidil (LONITEN) 10 MG tablet TAKE 1 TABLET IN AM AND 1/2 TABLET IN PM  . omeprazole (PRILOSEC) 20 MG capsule Take 1 capsule (20 mg total) by mouth daily.  . rosuvastatin (CRESTOR) 10  MG tablet Take 1 tablet (10 mg total) by mouth daily.  . sildenafil (VIAGRA) 100 MG tablet Take 1 tablet (100 mg total) by mouth daily as needed for erectile dysfunction.  . valsartan-hydrochlorothiazide (DIOVAN-HCT) 320-25 MG tablet TAKE 1 TABLET BY MOUTH DAILY.  . verapamil (CALAN-SR) 240 MG CR tablet TAKE 1 TABLET BY MOUTH EVERY DAY     Allergies:   Patient has no known allergies.   Social History   Social History  . Marital status: Married    Spouse name: N/A  . Number of children: 2  . Years of education: 50   Occupational History  . Retired [employer] Topics  . Smoking status: Former Smoker     Packs/day: 0.30    Years: 1.00    Types: Cigarettes    Quit date: 04/08/1985  . Smokeless tobacco: Never Used  . Alcohol use Yes     Comment: occasional  . Drug use: No  . Sexual activity: Not Asked   Other Topics Concern  . 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      Family History: The patient's family history includes Alcohol abuse in his brother and father; Diabetes in his mother; Throat cancer in his brother. There is no history of Colon cancer. ROS:   Please see the history of present illness.     All other systems reviewed and are negative.  EKGs/Labs/Other Studies Reviewed:    The following studies were reviewed today: Nuclear stress test  EKG:  EKG is not ordered today.   Recent Labs: 07/17/2016: ALT 25; BUN 20; Creatinine, Ser 1.47; Hemoglobin 14.1; Platelets 270.0; Potassium 3.5; Sodium 139; TSH 3.45  Recent Lipid Panel    Component Value Date/Time   CHOL 202 (H) 07/17/2016 1524   TRIG 85.0 07/17/2016 1524   HDL 44.60 07/17/2016 1524   CHOLHDL 5 07/17/2016 1524   VLDL 17.0 07/17/2016 1524   LDLCALC 141 (H) 07/17/2016 1524    Physical Exam:    VS:  BP 120/82   Pulse 74   Ht 5\' 11"  (1.803 m)   Wt 290 lb (131.5 kg)   BMI 40.45 kg/m     Wt Readings from Last 3 Encounters:  01/02/17 290 lb (131.5 kg)  10/01/16 291 lb (132 kg)  08/21/16 285 lb (129.3 kg)      General: Alert, oriented x3, no distress, Morbid obesity Head: no evidence of trauma, PERRL, EOMI, no exophtalmos or lid lag, no myxedema, no xanthelasma; normal ears, nose and oropharynx Neck: normal jugular venous pulsations and no hepatojugular reflux; brisk carotid pulses without delay and no carotid bruits Chest: clear to auscultation, no signs of consolidation by percussion or palpation, normal fremitus, symmetrical and full  respiratory excursions Cardiovascular: normal position and quality of the apical impulse, regular rhythm, normal first and second heart sounds, no murmurs, rubs or gallops Abdomen: no tenderness or distention, no masses by palpation, no abnormal pulsatility or arterial bruits, normal bowel sounds, no hepatosplenomegaly Extremities: no clubbing, cyanosis or edema; 2+ radial, ulnar and brachial pulses bilaterally; 2+ right femoral, posterior tibial and dorsalis pedis pulses; 2+ left femoral, posterior tibial and dorsalis pedis pulses; no subclavian or femoral bruits Neurological: grossly nonfocal Psych: Normal mood and affect   ASSESSMENT:    1.  Non-insulin treated type 2 diabetes mellitus ([place])   2. Coronary artery disease involving native coronary artery of native heart without angina pectoris   3. Dyslipidemia   4. Medication management    PLAN:    In order of problems listed above:  1. Abnormal nuclear stress test: The area in potential jeopardy is very small and he is asymptomatic. He does not need invasive evaluation at this time. The focus is on risk factor modification. 2. DM: Borderline control in April. I think he would be an excellent candidate for new agents such as SGLT inhibitors and/or Victoza. This would give him the best cardiovascular benefit and help with weight loss. I referred him to an endocrinologist today. He would also benefit from regular sessions with a nutritionist. 3. Morbid obesity: He appears to be truly motivated to lose weight and is interested in bariatric surgery. I think we should make all the efforts to help him lose weight with conventional methods first. 4. HLP: Time to repeat his lipid profile. Will check his hemoglobin A1c as well. 5. HTN: Severe hypertension that is required minoxidil and for other agents for control, but is now in optimal range. 6. Hypothyroidism: Normal TSH on current dose of levothyroxine in April   Medication Adjustments/Labs and  Tests Ordered: Current medicines are reviewed at length with the patient today.  Concerns regarding medicines are outlined above.  Orders Placed This Encounter  Procedures  . Lipid panel  . Comprehensive metabolic panel  . Hemoglobin A1c  . Ambulatory referral to Endocrinology   No orders of the defined types were placed in this encounter.   Signed, Sanda Klein, MD  01/02/2017 11:27 AM    [place]

## 2017-01-02 NOTE — Patient Instructions (Signed)
Dr Sallyanne Kuster recommends that you continue on your current medications as directed. Please refer to the Current Medication list given to you today.  Your physician recommends that you return for lab work at your earliest [place].  Dr Sallyanne Kuster recommends that you schedule a follow-up appointment in 6 months. You will receive a reminder letter in the mail two months in advance. If you don't receive a letter, please call our office to schedule the follow-up appointment.  If you need a refill on your cardiac medications before your next appointment, please call your pharmacy.   You have been referred to an endocrinologist, Dr Cruzita Lederer, for your diabetes

## 2017-01-05 LAB — COMPREHENSIVE METABOLIC PANEL
ALBUMIN: 4.1 g/dL (ref 3.6–4.8)
ALK PHOS: 81 IU/L (ref 39–117)
ALT: 26 IU/L (ref 0–44)
AST: 26 IU/L (ref 0–40)
Albumin/Globulin Ratio: 1.3 (ref 1.2–2.2)
BILIRUBIN TOTAL: 0.6 mg/dL (ref 0.0–1.2)
BUN / CREAT RATIO: 11 (ref 10–24)
BUN: 17 mg/dL (ref 8–27)
CO2: 25 mmol/L (ref 20–29)
CREATININE: 1.5 mg/dL — AB (ref 0.76–1.27)
Calcium: 10.5 mg/dL — ABNORMAL HIGH (ref 8.6–10.2)
Chloride: 99 mmol/L (ref 96–106)
GFR calc Af Amer: 55 mL/min/{1.73_m2} — ABNORMAL LOW (ref >59)
GFR calc non Af Amer: 47 mL/min/{1.73_m2} — ABNORMAL LOW (ref >59)
GLUCOSE: 144 mg/dL — AB (ref 65–99)
Globulin, Total: 3.1 g/dL (ref 1.5–4.5)
Potassium: 4.3 mmol/L (ref 3.5–5.2)
Sodium: 140 mmol/L (ref 134–144)
Total Protein: 7.2 g/dL (ref 6.0–8.5)

## 2017-01-05 LAB — HEMOGLOBIN A1C
ESTIMATED AVERAGE GLUCOSE: 209 mg/dL
HEMOGLOBIN A1C: 8.9 % — AB (ref 4.8–5.6)

## 2017-01-05 LAB — LIPID PANEL
CHOL/HDL RATIO: 2.6 ratio (ref 0.0–5.0)
Cholesterol, Total: 125 mg/dL (ref 100–199)
HDL: 48 mg/dL (ref >39)
LDL CALC: 66 mg/dL (ref 0–99)
Triglycerides: 56 mg/dL (ref 0–149)
VLDL CHOLESTEROL CAL: 11 mg/dL (ref 5–40)

## 2017-01-07 ENCOUNTER — Telehealth: Payer: Self-pay | Admitting: Internal Medicine

## 2017-01-07 NOTE — Telephone Encounter (Signed)
Call patient to schedule a new patient appointment, patient has an epic referral.

## 2017-01-09 NOTE — Telephone Encounter (Signed)
Pt is scheduled °

## 2017-01-10 ENCOUNTER — Encounter: Payer: Self-pay | Admitting: Gastroenterology

## 2017-02-04 ENCOUNTER — Other Ambulatory Visit: Payer: Self-pay | Admitting: Adult Health

## 2017-03-11 ENCOUNTER — Ambulatory Visit (INDEPENDENT_AMBULATORY_CARE_PROVIDER_SITE_OTHER): Payer: Medicare Other | Admitting: Internal Medicine

## 2017-03-11 ENCOUNTER — Telehealth: Payer: Self-pay | Admitting: Internal Medicine

## 2017-03-11 ENCOUNTER — Encounter: Payer: Self-pay | Admitting: Internal Medicine

## 2017-03-11 VITALS — BP 150/98 | HR 81 | Wt 284.8 lb

## 2017-03-11 DIAGNOSIS — E1165 Type 2 diabetes mellitus with hyperglycemia: Secondary | ICD-10-CM

## 2017-03-11 DIAGNOSIS — E1159 Type 2 diabetes mellitus with other circulatory complications: Secondary | ICD-10-CM

## 2017-03-11 MED ORDER — GLUCOSE BLOOD VI STRP: ORAL_STRIP | 12 refills | Status: DC

## 2017-03-11 MED ORDER — EMPAGLIFLOZIN 10 MG PO TABS: 10.0000 mg | ORAL_TABLET | Freq: Every day | ORAL | 5 refills | Status: DC

## 2017-03-11 MED ORDER — ONETOUCH ULTRASOFT LANCETS MISC: 12 refills | Status: DC

## 2017-03-11 MED ORDER — ONETOUCH VERIO IQ SYSTEM W/DEVICE KIT: PACK | 0 refills | Status: DC

## 2017-03-11 MED ORDER — METFORMIN HCL 500 MG PO TABS: ORAL_TABLET | ORAL | 2 refills | Status: DC

## 2017-03-11 NOTE — Progress Notes (Signed)
Patient ID: Matthew Keller, male   DOB: 1950/06/15, 66 y.o.   MRN: [id number]  HPI: Matthew Keller is a 66 y.o.-year-old male, referred by his cardiologist, Dr. Sallyanne Kuster, for management of DM2, dx in ~2008, non-insulin-dependent, uncontrolled, with complications (CAD, PAD, CKD).  Before last HbA1c, he was off his med for 1 month and eating poorly on the road. He drives a bus part time.  Last hemoglobin A1c was: Lab Results  Component Value Date   HGBA1C 8.9 (H) 01/02/2017   HGBA1C 7.0 (H) 07/17/2016   HGBA1C 6.9 (H) 06/29/2015   Pt is on a regimen of: - Metformin 500 mg 2x a day, with meals - Amaryl 2 mg daily in a.m.  Pt does not check sugars at home. - am: n/c - 2h after b'fast: n/c - before lunch: n/c - 2h after lunch: n/c - before dinner: n/c - 2h after dinner: n/c - bedtime: n/c - nighttime: n/c Lowest sugar was 90; he has hypoglycemia awareness at 90 or lower.  Highest sugar was 180s.  Glucometer: ?  Pt's meals are: - Breakfast: egg biscuit or pancakes or gravy biscuits or cereal - Lunch: fast food - Dinner: chicken + veggies + mashed potato - Snacks: cookies, etc. - mostly at night Regular sodas!   -+ CKD, last BUN/creatinine:  Lab Results  Component Value Date   BUN 17 01/02/2017   BUN 20 07/17/2016   CREATININE 1.50 (H) 01/02/2017   CREATININE 1.47 07/17/2016   Lab Results  Component Value Date   GFRAA 55 (L) 01/02/2017   GFRAA (L) 10/06/2009    57        The eGFR has been calculated using the MDRD equation. This calculation has not been validated in all clinical situations. eGFR's persistently <60 mL/min signify possible Chronic Kidney Disease.   GFRAA 54 11/05/2007   GFRAA 57 07/16/2007   GFRAA 58 12/15/2006   GFRAA 58 09/16/2006   GFRAA 62 07/09/2006   On valsartan 320 mg daily. -+ HL; last set of lipids: Lab Results  Component Value Date   CHOL 125 01/02/2017   HDL 48 01/02/2017   LDLCALC 66 01/02/2017   TRIG 56 01/02/2017   CHOLHDL  2.6 01/02/2017  On Crestor 10 mg daily. - last eye exam was 02/21/2016. No DR.  - + numbness and tingling in his L foot and leg  Pt has FH of DM in mother.  He also has a history of hypothyroidism, on levothyroxine 75 mcg daily.  Latest TSH was normal: Lab Results  Component Value Date   TSH 3.45 07/17/2016    ROS: Constitutional: no weight gain/loss, no fatigue, no subjective hyperthermia/hypothermia, + nocturia Eyes: no blurry vision, no xerophthalmia ENT: no sore throat, no nodules palpated in throat, no dysphagia/odynophagia, no hoarseness Cardiovascular: no CP/+ SOB/no palpitations/leg swelling Respiratory: no cough/+ SOB Gastrointestinal: no N/V/D/C Musculoskeletal: no muscle/joint aches Skin: no rashes Neurological: no tremors/numbness/tingling/dizziness Psychiatric: no depression/anxiety + low libido, + diff with erections  Past Medical History:  Diagnosis Date  . Atypical chest pain    LHC 08-03-01 nl coronaries and lv fn, lvedp 30.  cardiac workup by Dr. Harrington Challenger 5/09 neg cardiolite for ischemia, rec rsik reduction  . Chicken pox   . Diverticulosis    colonoscopy 9/01........Marland KitchenDr. Deatra Ina  . DM (diabetes mellitus) ([place])   . GERD (gastroesophageal reflux disease)   . Healthcare maintenance    pneumovax 09/2006, Td 04/2004, CPX June 12, 2010  . Hypertension   . Microcytic  anemia   . Morbid obesity ([place])    all time high 310 2009.  target wt = 208 for BMI <30.  referred back to nutrition again June 12, 2010  . 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 Renal insufficiency    baseline 1.6 October 24, 2009 > 1.3 June 12, 2010  . Thyroid disease    Past Surgical History:  Procedure Laterality Date  . KNEE SURGERY     Social History   Socioeconomic History  . Marital status: Married    Spouse name: Not on file  . Number of children: 2  . Years of education: 28  . Highest education level: Not on  file  Social Needs  . Financial resource strain: Not on file  . Food insecurity - worry: Not on file  . Food insecurity - inability: Not on file  . Transportation needs - medical: Not on file  . Transportation needs - non-medical: Not on file  Occupational History  . Occupation: Retired    Fish farm manager: [employer]  Tobacco Use  . Smoking status: Former Smoker    Packs/day: 0.30    Years: 1.00    Pack years: 0.30    Types: Cigarettes    Last attempt to quit: 04/08/1985    Years since quitting: 31.9  . Smokeless tobacco: Never Used  Substance and Sexual Activity  . Alcohol use: Yes    Comment: occasional  . Drug use: No  . Sexual activity: Not on file  Other Topics Concern  . 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    Current Outpatient Medications on File Prior to Visit  Medication Sig Dispense Refill  . acetaminophen (TYLENOL) 325 MG tablet Per bottle as needed     . furosemide (LASIX) 20 MG tablet Take 1 tablet (20 mg total) by mouth daily. 90 tablet 3  . furosemide (LASIX) 20 MG tablet TAKE 1 TABLET (20 MG TOTAL) BY MOUTH DAILY. 30 tablet 4  . glimepiride (AMARYL) 2 MG tablet TAKE 1 TABLET BY MOUTH EVERY DAY 90 tablet 0  . levothyroxine (SYNTHROID, LEVOTHROID) 75 MCG tablet Take 1 tablet (75 mcg total) by mouth daily. 30 tablet 0  . metFORMIN (GLUCOPHAGE) 500 MG tablet TAKE 1 TABLET BY MOUTH TWICE A DAY WITH A MEAL 180 tablet 2  . metoprolol succinate (TOPROL-XL) 25 MG 24 hr tablet Take 1 tablet (25 mg total) by mouth daily. 90 tablet 3  . minoxidil (LONITEN) 10 MG tablet TAKE 1 TABLET IN AM AND 1/2 TABLET IN PM 60 tablet 4  . omeprazole (PRILOSEC) 20 MG capsule Take 1 capsule (20 mg total) by mouth daily. 90 capsule 2  . rosuvastatin (CRESTOR) 10 MG tablet Take 1 tablet (10 mg total) by mouth daily. 90  tablet 1  . sildenafil (VIAGRA) 100 MG tablet Take 1 tablet (100 mg total) by mouth daily as needed for erectile dysfunction. 9 tablet 0  . valsartan-hydrochlorothiazide (DIOVAN-HCT) 320-25 MG tablet TAKE 1 TABLET BY MOUTH DAILY. 90 tablet 2  . verapamil (CALAN-SR) 240 MG CR tablet TAKE 1 TABLET BY MOUTH EVERY DAY 90 tablet 2   No current facility-administered medications on file prior to visit.    No Known Allergies Family  History  Problem Relation Age of Onset  . Diabetes Mother   . Alcohol abuse Father   . Throat cancer Brother        smoker  . Alcohol abuse Brother   . Colon cancer Neg Hx     PE: BP (!) 150/98   Pulse 81   Wt 284 lb 12.8 oz (129.2 kg)   SpO2 97%   BMI 39.72 kg/m  Wt Readings from Last 3 Encounters:  03/11/17 284 lb 12.8 oz (129.2 kg)  01/02/17 290 lb (131.5 kg)  10/01/16 291 lb (132 kg)   Constitutional: obese, in NAD Eyes: PERRLA, EOMI, no exophthalmos ENT: moist mucous membranes, no thyromegaly, no cervical lymphadenopathy Cardiovascular: RRR, No MRG Respiratory: CTA B Gastrointestinal: abdomen soft, NT, ND, BS+ Musculoskeletal: no deformities, strength intact in all 4 Skin: moist, warm, no rashes Neurological: no tremor with outstretched hands, DTR normal in all 4  ASSESSMENT: 1. DM2, non-insulin-dependent, uncontrolled, with complications - CAD -stress test: Small ischemic area - PAD-claudication - CKD  PLAN:  1. Patient with long-standing, uncontrolled diabetes, on oral antidiabetic regimen, which became insufficient.  His most recent HbA1c was 8.9% 2 months ago. He tells me he was off his DM meds for 2 mo and eating poorly >> sugars higher.  Since then, he lost 6 pounds but he is not checking his sugars >> unclear if improved. - we discussed about increasing Metformin and starting Jardiance instead of Amayl to help his cardio-vascular status and his weight along with his DM. - we discussed about SEs of Jardiance, which are: dizziness  (advised to be careful when stands from sitting position), decreased BP - usually not < normal (BP today is not low), and fungal UTIs (advised to let me know if develops one).  - we also discussed about the importance of improving diet >> for now: STOP sodas! - I suggested to:  Patient Instructions  Please increase: - Metformin to 1000 mg 2x a day, with meals  Stop: - Amaryl   Please add: - Jardiance 10 mg daily before b'fast  STOP SODAS!!!!  Please let me know if the sugars are consistently <80 or >200.  Please return in 1.5 months with your sugar log.   - Strongly advised him to start checking sugars at different times of the day - check 1-2 times a day, rotating checks - given sugar log and advised how to fill it and to bring it at next appt  - given foot care handout and explained the principles  - given instructions for hypoglycemia management "15-15 rule"  - advised for yearly eye exams  - Return to clinic in 1.5 mo with sugar log   Philemon Kingdom, MD PhD [facility] Endocrinology

## 2017-03-11 NOTE — Patient Instructions (Addendum)
Please increase: - Metformin to 1000 mg 2x a day, with meals  Stop: - Amaryl   Please add: - Jardiance 10 mg daily before b'fast  STOP SODAS!!!!  Please let me know if the sugars are consistently <80 or >200.  Please return in 1.5 months with your sugar log.   PATIENT INSTRUCTIONS FOR TYPE 2 DIABETES:  **Please join MyChart!** - see attached instructions about how to join if you have not done so already.  DIET AND EXERCISE Diet and exercise is an important part of diabetic treatment.  We recommended aerobic exercise in the form of brisk walking (working between 40-60% of maximal aerobic capacity, similar to brisk walking) for 150 minutes per week (such as 30 minutes five days per week) along with 3 times per week performing 'resistance' training (using various gauge rubber tubes with handles) 5-10 exercises involving the major muscle groups (upper body, lower body and core) performing 10-15 repetitions (or near fatigue) each exercise. Start at half the above goal but build slowly to reach the above goals. If limited by weight, joint pain, or disability, we recommend daily walking in a swimming pool with water up to waist to reduce pressure from joints while allow for adequate exercise.    BLOOD GLUCOSES Monitoring your blood glucoses is important for continued management of your diabetes. Please check your blood glucoses 2-4 times a day: fasting, before meals and at bedtime (you can rotate these measurements - e.g. one day check before the 3 meals, the next day check before 2 of the meals and before bedtime, etc.).   HYPOGLYCEMIA (low blood sugar) Hypoglycemia is usually a reaction to not eating, exercising, or taking too much insulin/ other diabetes drugs.  Symptoms include tremors, sweating, hunger, confusion, headache, etc. Treat IMMEDIATELY with 15 grams of Carbs: . 4 glucose tablets .  cup regular juice/soda . 2 tablespoons raisins . 4 teaspoons sugar . 1 tablespoon  honey Recheck blood glucose in 15 mins and repeat above if still symptomatic/blood glucose <100.  RECOMMENDATIONS TO REDUCE YOUR RISK OF DIABETIC COMPLICATIONS: * Take your prescribed MEDICATION(S) * Follow a DIABETIC diet: Complex carbs, fiber rich foods, (monounsaturated and polyunsaturated) fats * AVOID saturated/trans fats, high fat foods, >2,300 mg salt per day. * EXERCISE at least 5 times a week for 30 minutes or preferably daily.  * DO NOT SMOKE OR DRINK more than 1 drink a day. * Check your FEET every day. Do not wear tightfitting shoes. Contact us if you develop an ulcer * See your EYE doctor once a year or more if needed * Get a FLU shot once a year * Get a PNEUMONIA vaccine once before and once after age 51 years  GOALS:  * Your Hemoglobin A1c of <7%  * fasting sugars need to be <130 * after meals sugars need to be <180 (2h after you start eating) * Your Systolic BP should be 841 or lower  * Your Diastolic BP should be 80 or lower  * Your HDL (Good Cholesterol) should be 40 or higher  * Your LDL (Bad Cholesterol) should be 100 or lower. * Your Triglycerides should be 150 or lower  * Your Urine microalbumin (kidney function) should be <30 * Your Body Mass Index should be 25 or lower    Please consider the following ways to cut down carbs and fat and increase fiber and micronutrients in your diet: - substitute whole grain for white bread or pasta - substitute [place] rice for white rice -  substitute 90-calorie flat bread pieces for slices of bread when possible - substitute sweet potatoes or yams for white potatoes - substitute humus for margarine - substitute tofu for cheese when possible - substitute almond or rice milk for regular milk (would not drink soy milk daily due to concern for soy estrogen influence on breast cancer risk) - substitute dark chocolate for other sweets when possible - substitute water - can add lemon or orange slices for taste - for diet sodas  (artificial sweeteners will trick your body that you can eat sweets without getting calories and will lead you to overeating and weight gain in the long run) - do not skip breakfast or other meals (this will slow down the metabolism and will result in more weight gain over time)  - can try smoothies made from fruit and almond/rice milk in am instead of regular breakfast - can also try old-fashioned (not instant) oatmeal made with almond/rice milk in am - order the dressing on the side when eating salad at a restaurant (pour less than half of the dressing on the salad) - eat as little meat as possible - can try juicing, but should not forget that juicing will get rid of the fiber, so would alternate with eating raw veg./fruits or drinking smoothies - use as little oil as possible, even when using olive oil - can dress a salad with a mix of balsamic vinegar and lemon juice, for e.g. - use agave nectar, stevia sugar, or regular sugar rather than artificial sweateners - steam or broil/roast veggies  - snack on veggies/fruit/nuts (unsalted, preferably) when possible, rather than processed foods - reduce or eliminate aspartame in diet (it is in diet sodas, chewing gum, etc) Read the labels!  Try to read Dr. Janene Harvey book: "Program for Reversing Diabetes" for other ideas for healthy eating.

## 2017-03-11 NOTE — Telephone Encounter (Signed)
Sent in preferred meter, test strips, and lancets for the patient. Patient is aware

## 2017-03-11 NOTE — Telephone Encounter (Signed)
Please call patient ph# [phone] re: his machine and strips.  FYI-Embrace is the machine he has Strips for the machine are omnis All of the above are old-he wants to discuss maybe getting something newer-he is not sure if his strips are still good

## 2017-03-14 ENCOUNTER — Telehealth: Payer: Self-pay | Admitting: Internal Medicine

## 2017-03-14 NOTE — Telephone Encounter (Signed)
Please advise on below  

## 2017-03-14 NOTE — Telephone Encounter (Signed)
Pt is aware.  

## 2017-03-14 NOTE — Telephone Encounter (Signed)
Pt clarified that it was the Jardience, and he is not 100% sure if that was why he felt sick to his stomach or not. Would like advise on if he should continue or switch medication.

## 2017-03-14 NOTE — Telephone Encounter (Signed)
It would be very unusual to be from [place] >> please stop for 2 days or so and then retry. Stay well hydrated.

## 2017-03-14 NOTE — Telephone Encounter (Signed)
Per my last visit note:  Please increase: - Metformin to 1000 mg 2x a day, with meals  Stop: - Amaryl   Please add: - Jardiance 10 mg daily before b'fast  Does he mean Jardiance?

## 2017-03-14 NOTE — Telephone Encounter (Signed)
Pt took one glimepiride pill on Wednesday AM and had an upset stomach, he was nauseous he saw that could be a side effect and he hasnt taken another pill since. Please advise

## 2017-04-09 ENCOUNTER — Other Ambulatory Visit: Payer: Self-pay | Admitting: Family

## 2017-04-09 ENCOUNTER — Ambulatory Visit: Payer: Self-pay

## 2017-04-09 NOTE — Telephone Encounter (Signed)
   Reason for Disposition . [1] Symptoms of high blood sugar (e.g., frequent urination, weak, weight loss) AND [2] not able to test blood glucose  Answer Assessment - Initial Assessment Questions 1. BLOOD GLUCOSE: "What is your blood glucose level?"      190 Before breakfast 2. ONSET: "When did you check the blood glucose?"     This morning before breakfast 3. USUAL RANGE: "What is your glucose level usually?" (e.g., usual fasting morning value, usual evening value)      190 before breakfast - 300 - before breakfast 4. KETONES: "Do you check for ketones (urine or blood test strips)?" If yes, ask: "What does the test show now?"      No 5. TYPE 1 or 2:  "Do you know what type of diabetes you have?"  (e.g., Type 1, Type 2, Gestational; doesn't know)      Type 2 6. INSULIN: "Do you take insulin?" If yes, ask: "Have you missed any shots recently?"     No 7. DIABETES PILLS: "Do you take any pills for your diabetes?" If yes, ask: "Have you missed taking any pills recently?"     Yes - Stopped Jardience 8. OTHER SYMPTOMS: "Do you have any symptoms?" (e.g., fever, frequent urination, difficulty breathing, dizziness, weakness, vomiting)     Tired 9. PREGNANCY: "Is there any chance you are pregnant?" "When was your last menstrual period?"     *No Answer*  Protocols used: DIABETES - HIGH BLOOD SUGAR-A-AH  Pt. Has appointment for tomorrow. Will keep a check on bloog glucose today and watch his diet today. Wanted to see PCP. Did stop Jardience due to stomach upset.

## 2017-04-10 ENCOUNTER — Other Ambulatory Visit (INDEPENDENT_AMBULATORY_CARE_PROVIDER_SITE_OTHER): Payer: Medicare Other

## 2017-04-10 ENCOUNTER — Ambulatory Visit: Payer: Medicare Other | Admitting: Internal Medicine

## 2017-04-10 ENCOUNTER — Encounter: Payer: Self-pay | Admitting: Internal Medicine

## 2017-04-10 ENCOUNTER — Other Ambulatory Visit: Payer: Self-pay | Admitting: Internal Medicine

## 2017-04-10 VITALS — BP 124/82 | HR 93 | Temp 98.5°F | Ht 71.0 in | Wt 276.0 lb

## 2017-04-10 DIAGNOSIS — I10 Essential (primary) hypertension: Secondary | ICD-10-CM

## 2017-04-10 DIAGNOSIS — E1159 Type 2 diabetes mellitus with other circulatory complications: Secondary | ICD-10-CM

## 2017-04-10 DIAGNOSIS — N259 Disorder resulting from impaired renal tubular function, unspecified: Secondary | ICD-10-CM

## 2017-04-10 DIAGNOSIS — E1165 Type 2 diabetes mellitus with hyperglycemia: Secondary | ICD-10-CM

## 2017-04-10 DIAGNOSIS — R198 Other specified symptoms and signs involving the digestive system and abdomen: Secondary | ICD-10-CM | POA: Diagnosis not present

## 2017-04-10 DIAGNOSIS — E7800 Pure hypercholesterolemia, unspecified: Secondary | ICD-10-CM

## 2017-04-10 LAB — HEPATIC FUNCTION PANEL
ALBUMIN: 4.1 g/dL (ref 3.5–5.2)
ALK PHOS: 81 U/L (ref 39–117)
ALT: 24 U/L (ref 0–53)
AST: 18 U/L (ref 0–37)
Bilirubin, Direct: 0.1 mg/dL (ref 0.0–0.3)
Total Bilirubin: 0.8 mg/dL (ref 0.2–1.2)
Total Protein: 7.4 g/dL (ref 6.0–8.3)

## 2017-04-10 LAB — URINALYSIS, ROUTINE W REFLEX MICROSCOPIC
Bilirubin Urine: NEGATIVE
HGB URINE DIPSTICK: NEGATIVE
Ketones, ur: NEGATIVE
Leukocytes, UA: NEGATIVE
NITRITE: NEGATIVE
RBC / HPF: NONE SEEN (ref 0–?)
Specific Gravity, Urine: 1.015 (ref 1.000–1.030)
TOTAL PROTEIN, URINE-UPE24: NEGATIVE
Urine Glucose: >=1000 — AB
Urobilinogen, UA: 0.2 (ref 0.0–1.0)
pH: 5.5 (ref 5.0–8.0)

## 2017-04-10 LAB — LIPID PANEL
CHOL/HDL RATIO: 3
Cholesterol: 97 mg/dL (ref 0–200)
HDL: 36.2 mg/dL — ABNORMAL LOW (ref >39.00)
LDL Cholesterol: 39 mg/dL (ref 0–99)
NONHDL: 60.88
TRIGLYCERIDES: 107 mg/dL (ref 0.0–149.0)
VLDL: 21.4 mg/dL (ref 0.0–40.0)

## 2017-04-10 LAB — BASIC METABOLIC PANEL
BUN: 18 mg/dL (ref 6–23)
CALCIUM: 10.1 mg/dL (ref 8.4–10.5)
CO2: 30 meq/L (ref 19–32)
CREATININE: 1.42 mg/dL (ref 0.40–1.50)
Chloride: 99 mEq/L (ref 96–112)
GFR: 63.75 mL/min (ref >60.00)
GLUCOSE: 272 mg/dL — AB (ref 70–99)
Potassium: 4 mEq/L (ref 3.5–5.1)
Sodium: 137 mEq/L (ref 135–145)

## 2017-04-10 LAB — HEMOGLOBIN A1C: Hgb A1c MFr Bld: 12.3 % — ABNORMAL HIGH (ref 4.6–6.5)

## 2017-04-10 MED ORDER — METFORMIN HCL 500 MG PO TABS: ORAL_TABLET | ORAL | 2 refills | Status: DC

## 2017-04-10 MED ORDER — DULAGLUTIDE 0.75 MG/0.5ML ~~LOC~~ SOAJ: SUBCUTANEOUS | 3 refills | Status: DC

## 2017-04-10 MED ORDER — OMEPRAZOLE 40 MG PO CPDR: 40.0000 mg | DELAYED_RELEASE_CAPSULE | Freq: Every day | ORAL | 3 refills | Status: DC

## 2017-04-10 MED ORDER — EMPAGLIFLOZIN 25 MG PO TABS: 25.0000 mg | ORAL_TABLET | Freq: Every day | ORAL | 3 refills | Status: DC

## 2017-04-10 MED ORDER — VALSARTAN-HYDROCHLOROTHIAZIDE 320-25 MG PO TABS: 1.0000 | ORAL_TABLET | Freq: Every day | ORAL | 2 refills | Status: DC

## 2017-04-10 MED ORDER — FUROSEMIDE 20 MG PO TABS: 20.0000 mg | ORAL_TABLET | Freq: Every day | ORAL | 3 refills | Status: DC

## 2017-04-10 NOTE — Assessment & Plan Note (Signed)
Lab Results  Component Value Date   LDLCALC 39 04/10/2017   stable overall by history and exam, recent data reviewed with pt, and pt to continue medical treatment as before,  to f/u any worsening symptoms or concerns

## 2017-04-10 NOTE — Assessment & Plan Note (Signed)
Suspect this is even worse longer recently than patient admits, and DM maybe besides the jardiance to be responsible for such wt loss 15 lbs; for metformin to 2 pills per day only, increased jardiance to 25 qd, and add trulicity lower dose given recent a1c over 8; pt to f/u with endo as planned

## 2017-04-10 NOTE — Assessment & Plan Note (Signed)
Though pt wants to think jardiance is cause of symptoms, this is much more likely to be related to increased metformin; will need to reduce the metformin again to 2 pills per day; also for increased prilosec to 40 qd given some uncontrolled reflux which I think is likely incidental

## 2017-04-10 NOTE — Patient Instructions (Addendum)
Please decrease the metformin to 2 pills per day as this is highly likely to be the cause of the stomach symptoms  Ok to increase the jardiance to 25 mg per day  Also please start Trulicity once weekly  OK to increase the prilosec to 40 mg per day  Please continue all other medications as before, and refills have been done if requested.  Please have the pharmacy call with any other refills you may need.  Please continue your efforts at being more active, low cholesterol diabetic diet, and weight control  Please keep your appointments with your specialists as you may have planned - Dr Cruzita Lederer in about 1 month  Please go to the LAB in the Basement (turn left off the elevator) for the tests to be done today  You will be contacted by phone if any changes need to be made immediately.  Otherwise, you will receive a letter about your results with an explanation, but please check with MyChart first.  Please remember to sign up for MyChart if you have not done so, as this will be important to you in the future with finding out test results, communicating by private email, and scheduling acute appointments online when needed.  Please return in 6 months, or sooner if needed

## 2017-04-10 NOTE — Progress Notes (Signed)
Subjective:    Patient ID: Matthew Keller, male    DOB: [birth year], 67 y.o.   MRN: [id number]  HPI  Here with wife, pt is difficult historian, c/o DM uncontrolled worsen recently with increase of metformin to 4 pills from 2 pills per day, and new start jardiance 10 mg.  Has had GI symptoms assoc with Feeeling woozy, bubbly and upper abd fullness, soime nauasa, no vomiting and diarrhea small volume but freq x 2 wks sionce med changes, seemed to start at same time as start jardiance which he is trying to blame today.  Having BM seemed to help Gi symtpoms.  Has lost wt really without trying due to illness above.  Only took jardiance for 2 days prior to onset of symptoms, then finally quit jardiance 4 days ago, but symptoms persist though maybe somewhat now improved though can still have some occas bubbly and nausea.  Blood sugars have been 300's and once over 400 as well in the past week  Yesterday last check was 190. Also, has good compliance with prilosec. But has mild worsening reflux, without dysphagia, or blood.  No fever, No other sick contacts.  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  Also recent holidays without obvious sick exposure to family.  Also, BP improved it seems, and also lost wt with start jardiance BP Readings from Last 3 Encounters:  04/10/17 124/82  03/11/17 (!) 150/98  01/02/17 120/82   Wt Readings from Last 3 Encounters:  04/10/17 276 lb (125.2 kg)  03/11/17 284 lb 12.8 oz (129.2 kg)  01/02/17 290 lb (131.5 kg)   Past Medical History:  Diagnosis Date  . Atypical chest pain    LHC 08-03-01 nl coronaries and lv fn, lvedp 30.  cardiac workup by Dr. Harrington Challenger 5/09 neg cardiolite for ischemia, rec rsik reduction  . Chicken pox   . Diverticulosis    colonoscopy 9/01........Marland KitchenDr. Deatra Ina  . DM (diabetes mellitus) ([place])   . GERD (gastroesophageal reflux disease)   . Healthcare maintenance    pneumovax 09/2006, Td 04/2004, CPX June 12, 2010  .  Hypertension   . Microcytic anemia   . Morbid obesity ([place])    all time high 310 2009.  target wt = 208 for BMI <30.  referred back to nutrition again June 12, 2010  . 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 Renal insufficiency    baseline 1.6 October 24, 2009 > 1.3 June 12, 2010  . Thyroid disease    Past Surgical History:  Procedure Laterality Date  . KNEE SURGERY      reports that he quit smoking about 32 years ago. His smoking use included cigarettes. He has a 0.30 pack-year smoking history. he has never used smokeless tobacco. He reports that he drinks alcohol. He reports that he does not use drugs. family history includes Alcohol abuse in his brother and father; Diabetes in his mother; Throat cancer in his brother. No Known Allergies Current Outpatient Medications on File Prior to Visit  Medication Sig Dispense Refill  . acetaminophen (TYLENOL) 325 MG tablet Per bottle as needed     . Blood Glucose Monitoring Suppl (ONETOUCH VERIO IQ SYSTEM) w/Device KIT Use to check sugars 1-2 times per day 1 kit 0  . glucose blood (ONETOUCH VERIO) test strip Use to check sugars 1-2 times per day 100 each 12  . Lancets (  ONETOUCH ULTRASOFT) lancets Use as instructed 100 each 12  . latanoprost (XALATAN) 0.005 % ophthalmic solution     . levothyroxine (SYNTHROID, LEVOTHROID) 75 MCG tablet Take 1 tablet (75 mcg total) by mouth daily. 30 tablet 0  . metoprolol succinate (TOPROL-XL) 25 MG 24 hr tablet Take 1 tablet (25 mg total) by mouth daily. 90 tablet 3  . minoxidil (LONITEN) 10 MG tablet TAKE 1 TABLET IN AM AND 1/2 TABLET IN PM 60 tablet 4  . rosuvastatin (CRESTOR) 10 MG tablet Take 1 tablet (10 mg total) by mouth daily. 90 tablet 1  . sildenafil (VIAGRA) 100 MG tablet Take 1 tablet (100 mg total) by mouth daily as needed for erectile dysfunction. 9 tablet 0  . verapamil (CALAN-SR) 240 MG CR tablet TAKE 1 TABLET BY MOUTH EVERY  DAY 90 tablet 2   No current facility-administered medications on file prior to visit.    Review of Systems   Constitutional: Negative for other unusual diaphoresis or sweats HENT: Negative for ear discharge or swelling Eyes: Negative for other worsening visual disturbances Respiratory: Negative for stridor or other swelling  Gastrointestinal: Negative for worsening distension or other blood Genitourinary: Negative for retention or other urinary change Musculoskeletal: Negative for other MSK pain or swelling Skin: Negative for color change or other new lesions Neurological: Negative for worsening tremors and other numbness  Psychiatric/Behavioral: Negative for worsening agitation or other fatigue All other system neg per pt    Objective:   Physical Exam BP 124/82   Pulse 93   Temp 98.5 F (36.9 C) (Oral)   Ht 5' 11"  (1.803 m)   Wt 276 lb (125.2 kg)   SpO2 99%   BMI 38.49 kg/m  VS noted,  Constitutional: Pt appears in NAD HENT: Head: NCAT.  Right Ear: External ear normal.  Left Ear: External ear normal.  Eyes: . Pupils are equal, round, and reactive to light. Conjunctivae and EOM are normal Nose: without d/c or deformity Neck: Neck supple. Gross normal ROM Cardiovascular: Normal rate and regular rhythm.   Pulmonary/Chest: Effort normal and breath sounds without rales or wheezing.  Abd:  Soft, NT, ND, + BS, no organomegaly Neurological: Pt is alert. At baseline orientation, motor grossly intact Skin: Skin is warm. No rashes, other new lesions, no LE edema Psychiatric: Pt behavior is normal without agitation  No other exam findings  Lab Results  Component Value Date   WBC 9.5 07/17/2016   HGB 14.1 07/17/2016   HCT 41.6 07/17/2016   PLT 270.0 07/17/2016   GLUCOSE 144 (H) 01/02/2017   CHOL 125 01/02/2017   TRIG 56 01/02/2017   HDL 48 01/02/2017   LDLCALC 66 01/02/2017   ALT 26 01/02/2017   AST 26 01/02/2017   NA 140 01/02/2017   K 4.3 01/02/2017   CL 99  01/02/2017   CREATININE 1.50 (H) 01/02/2017   BUN 17 01/02/2017   CO2 25 01/02/2017   TSH 3.45 07/17/2016   PSA 0.25 07/17/2016   HGBA1C 8.9 (H) 01/02/2017   MICROALBUR <0.7 07/17/2016       Assessment & Plan:

## 2017-04-10 NOTE — Assessment & Plan Note (Signed)
stable overall by history and exam, recent data reviewed with pt, and pt to continue medical treatment as before,  to f/u any worsening symptoms or concerns BP Readings from Last 3 Encounters:  04/10/17 124/82  03/11/17 (!) 150/98  01/02/17 120/82

## 2017-04-14 ENCOUNTER — Telehealth: Payer: Self-pay | Admitting: Family

## 2017-04-14 NOTE — Telephone Encounter (Signed)
Copied from [place] [id number]. Topic: Quick Communication - Lab Results >> Apr 14, 2017  3:45 PM Wynetta Emery, [place] C wrote: Pt called in for results.    Please advise.

## 2017-04-17 NOTE — Telephone Encounter (Signed)
Labs were ordered by Dr Jenny Reichmann.

## 2017-04-17 NOTE — Telephone Encounter (Signed)
Patient calling again, requesting lab results

## 2017-04-17 NOTE — Telephone Encounter (Signed)
Pt has been informed and expressed understanding.  

## 2017-04-22 ENCOUNTER — Other Ambulatory Visit: Payer: Self-pay | Admitting: Internal Medicine

## 2017-07-04 ENCOUNTER — Other Ambulatory Visit: Payer: Self-pay | Admitting: Family

## 2017-07-18 ENCOUNTER — Encounter: Payer: Self-pay | Admitting: Gastroenterology

## 2017-07-21 ENCOUNTER — Encounter: Payer: Self-pay | Admitting: Adult Health

## 2017-07-21 ENCOUNTER — Ambulatory Visit: Payer: Medicare Other | Admitting: Adult Health

## 2017-07-21 ENCOUNTER — Telehealth: Payer: Self-pay | Admitting: Pulmonary Disease

## 2017-07-21 DIAGNOSIS — G4733 Obstructive sleep apnea (adult) (pediatric): Secondary | ICD-10-CM | POA: Diagnosis not present

## 2017-07-21 NOTE — Patient Instructions (Signed)
Wear CPAP At bedtime   Need to wear for at least 4-6 hr each night.  Great job on weight loss .  Follow up in 1 month and As needed

## 2017-07-21 NOTE — Telephone Encounter (Signed)
Spoke with pt. He apologized for any confusion. Pt has an appointment with Tammy today. Nothing further was needed.

## 2017-07-21 NOTE — Assessment & Plan Note (Signed)
Encouraged on compliance   Plan  Patient Instructions  Wear CPAP At bedtime   Need to wear for at least 4-6 hr each night.  Great job on weight loss .  Follow up in 1 month and As needed

## 2017-07-21 NOTE — Progress Notes (Signed)
$'@Patient'F$  ID: Matthew Keller, male    DOB: Sep 24, 1950, 67 y.o.   MRN: [id number]  Chief Complaint  Patient presents with  . Follow-up    OSA     Referring provider: Golden Circle, FNP  HPI: 66 yo male followed for OSA  Has DM/HTN /Hyperlipidemia/Obesity  Drives for Holiday tours  TEST  PSG showed obstructive sleep apnea with AHI 12/h, increases during REM to 50/h, lowest desaturation 75% c/w severe OSA.   07/21/2017 Follow up : OSA  Patient returns for a follow-up for sleep apnea.  Patient says that he has been trying to wear CPAP more.  He does travel quite a bit with his job.  He tries to take it with him each time.  He wears it for about 4-5 hours each night. Says he has been feeling good.  No daytime sleepiness.  He is exercising more.  Has lost 25 pounds.  Is eating healthier. Download shows 53% usage for greater than 4 hours each night.  Average usage at 4.5 hours.  Patient is on CPAP 11 cm H2O.  AHI 1.4.    No Known Allergies  Immunization History  Administered Date(s) Administered  . Influenza Whole 01/06/2009  . Influenza, High Dose Seasonal PF 06/21/2015  . Influenza-Unspecified 01/06/2014  . Pneumococcal Conjugate-13 11/07/2014  . Pneumococcal Polysaccharide-23 11/17/2013  . Tdap 08/01/2014    Past Medical History:  Diagnosis Date  . Atypical chest pain    LHC 08-03-01 nl coronaries and lv fn, lvedp 30.  cardiac workup by Dr. Harrington Challenger 5/09 neg cardiolite for ischemia, rec rsik reduction  . Chicken pox   . Diverticulosis    colonoscopy 9/01........Marland KitchenDr. Deatra Ina  . DM (diabetes mellitus) ([place])   . GERD (gastroesophageal reflux disease)   . Healthcare maintenance    pneumovax 09/2006, Td 04/2004, CPX June 12, 2010  . Hypertension   . Microcytic anemia   . Morbid obesity ([place])    all time high 310 2009.  target wt = 208 for BMI <30.  referred back to nutrition again June 12, 2010  . 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 Renal insufficiency    baseline 1.6 October 24, 2009 > 1.3 June 12, 2010  . Thyroid disease     Tobacco History: Social History   Tobacco Use  Smoking Status Former Smoker  . Packs/day: 0.30  . Years: 1.00  . Pack years: 0.30  . Types: Cigarettes  . Last attempt to quit: 04/08/1985  . Years since quitting: 32.3  Smokeless Tobacco Never Used   Counseling given: Not Answered   Outpatient Encounter Medications as of 07/21/2017  Medication Sig  . acetaminophen (TYLENOL) 325 MG tablet Per bottle as needed   . Blood Glucose Monitoring Suppl (ONETOUCH VERIO IQ SYSTEM) w/Device KIT Use to check sugars 1-2 times per day  . Dulaglutide (TRULICITY) 3.81 OF/7.5ZW SOPN 0.5 ml sq weekly  . empagliflozin (JARDIANCE) 25 MG TABS tablet Take 25 mg by mouth daily.  . furosemide (LASIX) 20 MG tablet Take 1 tablet (20 mg total) by mouth daily.  [name] glucose blood (ONETOUCH VERIO) test strip Use to check sugars 1-2 times per day  . Lancets (ONETOUCH ULTRASOFT) lancets Use as instructed  . latanoprost (XALATAN) 0.005 % ophthalmic solution   . levothyroxine (SYNTHROID, LEVOTHROID) 75 MCG tablet Take 1 tablet (75 mcg total) by mouth daily.  . metFORMIN (GLUCOPHAGE) 500 MG tablet TAKE  2 TABLETs BY MOUTH Once a DAY WITH A MEAL  . metoprolol succinate (TOPROL-XL) 25 MG 24 hr tablet Take 1 tablet (25 mg total) by mouth daily.  . minoxidil (LONITEN) 10 MG tablet TAKE 1 TABLET IN AM AND 1/2 TABLET IN PM  . omeprazole (PRILOSEC) 40 MG capsule Take 1 capsule (40 mg total) by mouth daily.  . rosuvastatin (CRESTOR) 10 MG tablet TAKE 1 TABLET BY MOUTH EVERY DAY  . sildenafil (VIAGRA) 100 MG tablet Take 1 tablet (100 mg total) by mouth daily as needed for erectile dysfunction.  . valsartan-hydrochlorothiazide (DIOVAN-HCT) 320-25 MG tablet Take 1 tablet by mouth daily.  . verapamil (CALAN-SR) 240 MG CR tablet TAKE 1 TABLET BY MOUTH EVERY DAY   No facility-administered encounter  medications on file as of 07/21/2017.      Review of Systems  Constitutional:   No  weight loss, night sweats,  Fevers, chills, fatigue, or  lassitude.  HEENT:   No headaches,  Difficulty swallowing,  Tooth/dental problems, or  Sore throat,                No sneezing, itching, ear ache, nasal congestion, post nasal drip,   CV:  No chest pain,  Orthopnea, PND, swelling in lower extremities, anasarca, dizziness, palpitations, syncope.   GI  No heartburn, indigestion, abdominal pain, nausea, vomiting, diarrhea, change in bowel habits, loss of appetite, bloody stools.   Resp: No shortness of breath with exertion or at rest.  No excess mucus, no productive cough,  No non-productive cough,  No coughing up of blood.  No change in color of mucus.  No wheezing.  No chest wall deformity  Skin: no rash or lesions.  GU: no dysuria, change in color of urine, no urgency or frequency.  No flank pain, no hematuria   MS:  No joint pain or swelling.  No decreased range of motion.  No back pain.    Physical Exam  BP 124/66 (BP Location: Left Arm, Cuff Size: Normal)   Pulse 85   Ht _0  (1.803 m)   Wt 273 lb 9.6 oz (124.1 kg)   SpO2 96%   BMI 38.16 kg/m   GEN: A/Ox3; pleasant , NAD, obese    HEENT:  [place]/AT,  EACs-clear, TMs-wnl, NOSE-clear, THROAT-clear, no lesions, no postnasal drip or exudate noted. Class 2-3 MP airway   NECK:  Supple w/ fair ROM; no JVD; normal carotid impulses w/o bruits; no thyromegaly or nodules palpated; no lymphadenopathy.    RESP  Clear  P & A; w/o, wheezes/ rales/ or rhonchi. no accessory muscle use, no dullness to percussion  CARD:  RRR, no m/r/g, no peripheral edema, pulses intact, no cyanosis or clubbing.  GI:   Soft & nt; nml bowel sounds; no organomegaly or masses detected.   Musco: Warm bil, no deformities or joint swelling noted.   Neuro: alert, no focal deficits noted.    Skin: Warm, no lesions or rashes    Lab Results:  CBC   BNP No results  found for: BNP  ProBNP  Imaging: No results found.   Assessment & Plan:   Obstructive sleep apnea Encouraged on compliance   Plan  Patient Instructions  Wear CPAP At bedtime   Need to wear for at least 4-6 hr each night.  Great job on weight loss .  Follow up in 1 month and As needed        Morbid obesity ([place]) Wt loss  Rexene Edison, NP 07/21/2017

## 2017-07-21 NOTE — Assessment & Plan Note (Signed)
Wt loss  

## 2017-07-29 ENCOUNTER — Encounter: Payer: Self-pay | Admitting: Internal Medicine

## 2017-07-29 ENCOUNTER — Ambulatory Visit: Payer: Medicare Other | Admitting: Internal Medicine

## 2017-07-29 ENCOUNTER — Other Ambulatory Visit (INDEPENDENT_AMBULATORY_CARE_PROVIDER_SITE_OTHER): Payer: Medicare Other

## 2017-07-29 ENCOUNTER — Other Ambulatory Visit: Payer: Self-pay | Admitting: Internal Medicine

## 2017-07-29 VITALS — BP 124/76 | HR 73 | Temp 98.4°F | Ht 71.0 in | Wt 275.0 lb

## 2017-07-29 DIAGNOSIS — E1165 Type 2 diabetes mellitus with hyperglycemia: Secondary | ICD-10-CM

## 2017-07-29 DIAGNOSIS — N179 Acute kidney failure, unspecified: Secondary | ICD-10-CM

## 2017-07-29 DIAGNOSIS — E1159 Type 2 diabetes mellitus with other circulatory complications: Secondary | ICD-10-CM

## 2017-07-29 DIAGNOSIS — Z0000 Encounter for general adult medical examination without abnormal findings: Secondary | ICD-10-CM | POA: Diagnosis not present

## 2017-07-29 LAB — CBC WITH DIFFERENTIAL/PLATELET
Basophils Absolute: 0.1 K/uL (ref 0.0–0.1)
Basophils Relative: 1.2 % (ref 0.0–3.0)
Eosinophils Absolute: 0.1 K/uL (ref 0.0–0.7)
Eosinophils Relative: 1.5 % (ref 0.0–5.0)
HCT: 36.2 % — ABNORMAL LOW (ref 39.0–52.0)
Hemoglobin: 12.4 g/dL — ABNORMAL LOW (ref 13.0–17.0)
Lymphocytes Relative: 22.6 % (ref 12.0–46.0)
Lymphs Abs: 2 K/uL (ref 0.7–4.0)
MCHC: 34.2 g/dL (ref 30.0–36.0)
MCV: 73.3 fl — ABNORMAL LOW (ref 78.0–100.0)
Monocytes Absolute: 0.7 K/uL (ref 0.1–1.0)
Monocytes Relative: 7.5 % (ref 3.0–12.0)
Neutro Abs: 6.1 K/uL (ref 1.4–7.7)
Neutrophils Relative %: 67.2 % (ref 43.0–77.0)
Platelets: 274 K/uL (ref 150.0–400.0)
RBC: 4.94 Mil/uL (ref 4.22–5.81)
RDW: 15.1 % (ref 11.5–15.5)
WBC: 9 K/uL (ref 4.0–10.5)

## 2017-07-29 LAB — MICROALBUMIN / CREATININE URINE RATIO
CREATININE, U: 70 mg/dL
Microalb Creat Ratio: 1 mg/g (ref 0.0–30.0)

## 2017-07-29 LAB — URINALYSIS, ROUTINE W REFLEX MICROSCOPIC
Bilirubin Urine: NEGATIVE
Hgb urine dipstick: NEGATIVE
Ketones, ur: NEGATIVE
Leukocytes, UA: NEGATIVE
Nitrite: NEGATIVE
RBC / HPF: NONE SEEN (ref 0–?)
SPECIFIC GRAVITY, URINE: 1.01 (ref 1.000–1.030)
Total Protein, Urine: NEGATIVE
Urine Glucose: >=1000 — AB
Urobilinogen, UA: 0.2 (ref 0.0–1.0)
pH: 5.5 (ref 5.0–8.0)

## 2017-07-29 LAB — HEPATIC FUNCTION PANEL
ALK PHOS: 75 U/L (ref 39–117)
ALT: 24 U/L (ref 0–53)
AST: 25 U/L (ref 0–37)
Albumin: 4.1 g/dL (ref 3.5–5.2)
BILIRUBIN DIRECT: 0.2 mg/dL (ref 0.0–0.3)
BILIRUBIN TOTAL: 0.7 mg/dL (ref 0.2–1.2)
Total Protein: 7 g/dL (ref 6.0–8.3)

## 2017-07-29 LAB — BASIC METABOLIC PANEL
BUN: 22 mg/dL (ref 6–23)
CALCIUM: 10.6 mg/dL — AB (ref 8.4–10.5)
CO2: 31 meq/L (ref 19–32)
Chloride: 99 mEq/L (ref 96–112)
Creatinine, Ser: 1.75 mg/dL — ABNORMAL HIGH (ref 0.40–1.50)
GFR: 50.04 mL/min — ABNORMAL LOW (ref >60.00)
Glucose, Bld: 167 mg/dL — ABNORMAL HIGH (ref 70–99)
POTASSIUM: 3.8 meq/L (ref 3.5–5.1)
SODIUM: 137 meq/L (ref 135–145)

## 2017-07-29 LAB — LIPID PANEL
Cholesterol: 111 mg/dL (ref 0–200)
HDL: 41.9 mg/dL (ref >39.00)
LDL Cholesterol: 49 mg/dL (ref 0–99)
NONHDL: 68.93
Total CHOL/HDL Ratio: 3
Triglycerides: 99 mg/dL (ref 0.0–149.0)
VLDL: 19.8 mg/dL (ref 0.0–40.0)

## 2017-07-29 LAB — TSH: TSH: 2.65 u[IU]/mL (ref 0.35–4.50)

## 2017-07-29 LAB — PSA: PSA: 0.23 ng/mL (ref 0.10–4.00)

## 2017-07-29 LAB — HEMOGLOBIN A1C: Hgb A1c MFr Bld: 8.2 % — ABNORMAL HIGH (ref 4.6–6.5)

## 2017-07-29 MED ORDER — GLIPIZIDE ER 2.5 MG PO TB24: 2.5000 mg | ORAL_TABLET | Freq: Every day | ORAL | 3 refills | Status: DC

## 2017-07-29 MED ORDER — METFORMIN HCL ER 500 MG PO TB24: 500.0000 mg | ORAL_TABLET | Freq: Every day | ORAL | 3 refills | Status: DC

## 2017-07-29 NOTE — Progress Notes (Signed)
Subjective:    Patient ID: Matthew Keller, male    DOB: 05/07/1950, 67 y.o.   MRN: [id number]  HPI  Here for wellness and f/u;  Overall doing ok;  Pt denies Chest pain, worsening SOB, DOE, wheezing, orthopnea, PND, worsening LE edema, palpitations, dizziness or syncope.  Pt denies neurological change such as new headache, facial or extremity weakness.  Pt denies polydipsia, polyuria, or low sugar symptoms. Pt states overall good compliance with treatment and medications, good tolerability, and has been trying to follow appropriate diet.  Pt denies worsening depressive symptoms, suicidal ideation or panic. No fever, night sweats, wt loss, loss of appetite, or other constitutional symptoms.  Pt states good ability with ADL's, has low fall risk, home safety reviewed and adequate, no other significant changes in hearing or vision, and only occasionally active with exercise.  Current CBGs now in the 140's in the AM on the lower metformin and increase jardiance last visit.  "this is the best Ive felt in a long time." .  GI upset with high dose metformin now better after reducing dose, and tolerating Jardiance well.   Re-scheduled for optho later this month  Alsoi has Apr 25 appt for colon prep then colonoscopy after that.   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  No new complaints or interval hx Past Medical History:  Diagnosis Date  . Atypical chest pain    LHC 08-03-01 nl coronaries and lv fn, lvedp 30.  cardiac workup by Dr. Harrington Challenger 5/09 neg cardiolite for ischemia, rec rsik reduction  . Chicken pox   . Diverticulosis    colonoscopy 9/01........Marland KitchenDr. Deatra Ina  . DM (diabetes mellitus) ([place])   . GERD (gastroesophageal reflux disease)   . Healthcare maintenance    pneumovax 09/2006, Td 04/2004, CPX June 12, 2010  . Hypertension   . Microcytic anemia   . Morbid obesity ([place])    all time high 310 2009.  target wt = 208 for BMI <30.  referred back to nutrition again June 12, 2010  .  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 Renal insufficiency    baseline 1.6 October 24, 2009 > 1.3 June 12, 2010  . Thyroid disease    Past Surgical History:  Procedure Laterality Date  . KNEE SURGERY      reports that he quit smoking about 32 years ago. His smoking use included cigarettes. He has a 0.30 pack-year smoking history. He has never used smokeless tobacco. He reports that he drinks alcohol. He reports that he does not use drugs. family history includes Alcohol abuse in his brother and father; Diabetes in his mother; Throat cancer in his brother. No Known Allergies Current Outpatient Medications on File Prior to Visit  Medication Sig Dispense Refill  . acetaminophen (TYLENOL) 325 MG tablet Per bottle as needed     . Blood Glucose Monitoring Suppl (ONETOUCH VERIO IQ SYSTEM) w/Device KIT Use to check sugars 1-2 times per day 1 kit 0  . Dulaglutide (TRULICITY) 1.74 BS/4.9QP SOPN 0.5 ml sq weekly 6 mL 3  . empagliflozin (JARDIANCE) 25 MG TABS tablet Take 25 mg by mouth daily. 90 tablet 3  . furosemide (LASIX) 20 MG tablet Take 1 tablet (20 mg total) by mouth daily. 90 tablet 3  . glucose blood (ONETOUCH VERIO) test strip Use to check sugars 1-2 times per day 100 each 12  . Lancets (  ONETOUCH ULTRASOFT) lancets Use as instructed 100 each 12  . latanoprost (XALATAN) 0.005 % ophthalmic solution     . levothyroxine (SYNTHROID, LEVOTHROID) 75 MCG tablet Take 1 tablet (75 mcg total) by mouth daily. 30 tablet 0  . metoprolol succinate (TOPROL-XL) 25 MG 24 hr tablet Take 1 tablet (25 mg total) by mouth daily. 90 tablet 3  . minoxidil (LONITEN) 10 MG tablet TAKE 1 TABLET IN AM AND 1/2 TABLET IN PM 60 tablet 4  . omeprazole (PRILOSEC) 40 MG capsule Take 1 capsule (40 mg total) by mouth daily. 90 capsule 3  . rosuvastatin (CRESTOR) 10 MG tablet TAKE 1 TABLET BY MOUTH EVERY DAY 90 tablet 1  . sildenafil (VIAGRA) 100 MG  tablet Take 1 tablet (100 mg total) by mouth daily as needed for erectile dysfunction. 9 tablet 0  . valsartan-hydrochlorothiazide (DIOVAN-HCT) 320-25 MG tablet Take 1 tablet by mouth daily. 90 tablet 2  . verapamil (CALAN-SR) 240 MG CR tablet TAKE 1 TABLET BY MOUTH EVERY DAY 90 tablet 2   No current facility-administered medications on file prior to visit.    Review of Systems Constitutional: Negative for other unusual diaphoresis, sweats, appetite or weight changes HENT: Negative for other worsening hearing loss, ear pain, facial swelling, mouth sores or neck stiffness.   Eyes: Negative for other worsening pain, redness or other visual disturbance.  Respiratory: Negative for other stridor or swelling Cardiovascular: Negative for other palpitations or other chest pain  Gastrointestinal: Negative for worsening diarrhea or loose stools, blood in stool, distention or other pain Genitourinary: Negative for hematuria, flank pain or other change in urine volume.  Musculoskeletal: Negative for myalgias or other joint swelling.  Skin: Negative for other color change, or other wound or worsening drainage.  Neurological: Negative for other syncope or numbness. Hematological: Negative for other adenopathy or swelling Psychiatric/Behavioral: Negative for hallucinations, other worsening agitation, SI, self-injury, or new decreased concentration All other system neg per pt    Objective:   Physical Exam BP 124/76   Pulse 73   Temp 98.4 F (36.9 C) (Oral)   Ht _0  (1.803 m)   Wt 275 lb (124.7 kg)   SpO2 98%   BMI 38.35 kg/m  VS noted,  Constitutional: Pt is oriented to person, place, and time. Appears well-developed and well-nourished, in no significant distress and comfortable Head: Normocephalic and atraumatic  Eyes: Conjunctivae and EOM are normal. Pupils are equal, round, and reactive to light Right Ear: External ear normal without discharge Left Ear: External ear normal without  discharge Nose: Nose without discharge or deformity Mouth/Throat: Oropharynx is without other ulcerations and moist  Neck: Normal range of motion. Neck supple. No JVD present. No tracheal deviation present or significant neck LA or mass Cardiovascular: Normal rate, regular rhythm, normal heart sounds and intact distal pulses.   Pulmonary/Chest: WOB normal and breath sounds without rales or wheezing  Abdominal: Soft. Bowel sounds are normal. NT. No HSM  Musculoskeletal: Normal range of motion. Exhibits chronic trace venous RLE edema Lymphadenopathy: Has no other cervical adenopathy.  Neurological: Pt is alert and oriented to person, place, and time. Pt has normal reflexes. No cranial nerve deficit. Motor grossly intact, Gait intact Skin: Skin is warm and dry. No rash noted or new ulcerations Psychiatric:  Has normal mood and affect. Behavior is normal without agitation No other exam findings  Lab Results  Component Value Date   WBC 9.5 07/17/2016   HGB 14.1 07/17/2016   HCT 41.6 07/17/2016  PLT 270.0 07/17/2016   GLUCOSE 272 (H) 04/10/2017   CHOL 97 04/10/2017   TRIG 107.0 04/10/2017   HDL 36.20 (L) 04/10/2017   LDLCALC 39 04/10/2017   ALT 24 04/10/2017   AST 18 04/10/2017   NA 137 04/10/2017   K 4.0 04/10/2017   CL 99 04/10/2017   CREATININE 1.42 04/10/2017   BUN 18 04/10/2017   CO2 30 04/10/2017   TSH 3.45 07/17/2016   PSA 0.25 07/17/2016   HGBA1C 12.3 (H) 04/10/2017   MICROALBUR <0.7 07/17/2016       Assessment & Plan:

## 2017-07-29 NOTE — Assessment & Plan Note (Signed)

## 2017-07-29 NOTE — Patient Instructions (Signed)
OK to change the metformin to the ER (extended release) 500 mg with 2 pills in the AM only  OK to use up the ones you have at 500 mg twice per day until gone  Please continue all other medications as before, and refills have been done if requested.  Please have the pharmacy call with any other refills you may need.  Please continue your efforts at being more active, low cholesterol diet, and weight control.  You are otherwise up to date with prevention measures today.  Please keep your appointments with your specialists as you may have planned  Please go to the LAB in the Basement (turn left off the elevator) for the tests to be done today  You will be contacted by phone if any changes need to be made immediately.  Otherwise, you will receive a letter about your results with an explanation, but please check with MyChart first.  Please remember to sign up for MyChart if you have not done so, as this will be important to you in the future with finding out test results, communicating by private email, and scheduling acute appointments online when needed.  Please return in 6 months, or sooner if needed, with Lab testing done 3-5 days before

## 2017-07-29 NOTE — Assessment & Plan Note (Addendum)
Improved by hx, for f/u lab today with goal a1c < 7, need to avoid low sugars due to occupation

## 2017-07-30 ENCOUNTER — Telehealth: Payer: Self-pay

## 2017-07-30 ENCOUNTER — Other Ambulatory Visit: Payer: Self-pay | Admitting: Family

## 2017-07-30 NOTE — Telephone Encounter (Signed)
-----   Message from Biagio Borg, MD sent at 07/29/2017  8:13 PM EDT ----- Letter sent, cont same tx except  The test results show that your current treatment is OK, except the A1c is still mild to moderately high, and the kidney function is mildly worse on your current medications and diet.  It appears the lasix may be too much in all of this, leading to mild dehydration that is affecting the kidneys.  The lasix should be held off for 5 days, then return for a repeat blood test on Monday, April 29  We also need to start a low dose of another medication for diabetes called glipizide ER 2.5 mg per day.  This is a low dose and should not cause low sugar to affect your driving.  I will send a prescription, and you should hear from the office as well.   Matthew Keller to please inform pt, I will do rx and order for the blood test

## 2017-07-30 NOTE — Telephone Encounter (Signed)
Called pt, LVM.   CRM created.  

## 2017-07-31 ENCOUNTER — Other Ambulatory Visit: Payer: Self-pay

## 2017-07-31 ENCOUNTER — Ambulatory Visit (AMBULATORY_SURGERY_CENTER): Payer: Self-pay

## 2017-07-31 VITALS — Ht 71.0 in | Wt 275.0 lb

## 2017-07-31 DIAGNOSIS — Z86010 Personal history of colonic polyps: Secondary | ICD-10-CM

## 2017-07-31 MED ORDER — NA SULFATE-K SULFATE-MG SULF 17.5-3.13-1.6 GM/177ML PO SOLN: 1.0000 | Freq: Once | ORAL | 0 refills | Status: AC

## 2017-07-31 NOTE — Progress Notes (Signed)
Denies allergies to eggs or soy products. Denies complication of anesthesia or sedation. Denies use of weight loss medication. Denies use of O2.   Emmi instructions declined.  

## 2017-08-04 ENCOUNTER — Encounter: Payer: Self-pay | Admitting: Internal Medicine

## 2017-08-04 ENCOUNTER — Telehealth: Payer: Self-pay

## 2017-08-04 ENCOUNTER — Other Ambulatory Visit: Payer: Self-pay | Admitting: Internal Medicine

## 2017-08-04 ENCOUNTER — Other Ambulatory Visit (INDEPENDENT_AMBULATORY_CARE_PROVIDER_SITE_OTHER): Payer: Medicare Other

## 2017-08-04 DIAGNOSIS — N179 Acute kidney failure, unspecified: Secondary | ICD-10-CM | POA: Diagnosis not present

## 2017-08-04 LAB — BASIC METABOLIC PANEL
BUN: 18 mg/dL (ref 6–23)
CHLORIDE: 103 meq/L (ref 96–112)
CO2: 30 meq/L (ref 19–32)
CREATININE: 1.56 mg/dL — AB (ref 0.40–1.50)
Calcium: 10.1 mg/dL (ref 8.4–10.5)
GFR: 57.14 mL/min — ABNORMAL LOW (ref >60.00)
GLUCOSE: 106 mg/dL — AB (ref 70–99)
Potassium: 3.9 mEq/L (ref 3.5–5.1)
Sodium: 141 mEq/L (ref 135–145)

## 2017-08-04 NOTE — Telephone Encounter (Signed)
Pt informed and expressed understanding. 

## 2017-08-04 NOTE — Telephone Encounter (Signed)
-----   Message from Biagio Borg, MD sent at 08/04/2017 12:38 PM EDT ----- Letter sent, cont same tx except  The test results show that your current treatment is OK, as the kidney function is improved.  OK to restart lasix, but we should check again in 2 weeks.  I will ask the office to call you about this.    Woodrow Dulski to please inform pt, I will do order for BMP in 2 wks

## 2017-08-05 ENCOUNTER — Other Ambulatory Visit: Payer: Self-pay | Admitting: Family

## 2017-08-08 ENCOUNTER — Encounter: Payer: Self-pay | Admitting: Gastroenterology

## 2017-08-08 ENCOUNTER — Ambulatory Visit: Payer: Medicare Other | Admitting: Adult Health

## 2017-08-08 ENCOUNTER — Encounter: Payer: Self-pay | Admitting: Adult Health

## 2017-08-08 ENCOUNTER — Telehealth: Payer: Self-pay | Admitting: Internal Medicine

## 2017-08-08 DIAGNOSIS — G4733 Obstructive sleep apnea (adult) (pediatric): Secondary | ICD-10-CM

## 2017-08-08 NOTE — Telephone Encounter (Signed)
Copied from [place] [id number]. Topic: Quick Communication - See Telephone Encounter >> Aug 08, 2017  1:43 PM Robina Ade, Helene Kelp D wrote: CRM for notification. See Telephone encounter for: 08/08/17. Baldo Ash from [facility] in [place] called and they would like patient A1c sent to them. Patient is in the office now. There number is [phone] fax [phone].

## 2017-08-08 NOTE — Assessment & Plan Note (Signed)
Wt loss  

## 2017-08-08 NOTE — Patient Instructions (Addendum)
Wear CPAP At bedtime   Need to wear for at least 4-6 hr each night.  Great job on weight loss .  Follow up in 1 year with Dr. [name]  And As needed

## 2017-08-08 NOTE — Progress Notes (Signed)
@Patient  ID: Matthew Keller, male    DOB: 1950/12/17, 67 y.o.   MRN: [id number]  Chief Complaint  Patient presents with  . Follow-up    OSA    Referring provider: Golden Circle, FNP  HPI: 67 yo male followed for OSA  Has DM/HTN /Hyperlipidemia/Obesity  Drives for Holiday tours  TEST PSG showed obstructive sleep apnea with AHI 12/h, increases during REM to 50/h, lowest desaturation 75% c/w severe OSA.  08/08/2017 Follow up : OSA  Patient presents for a follow-up visit.  Patient has underlying sleep apnea.  Patient says he has been doing very well with the CPAP.  He is wearing it every night.  He does travel quite a bit with his job.  And is wearing it whenever he is out of town as well.  Patient is try to get in around 6 hours each night.  He feels rested with no daytime sleepiness. Download shows excellent compliance with 100% usage at 6 hours.  Patient is on CPAP 11 cm H2O.  AHI 1.6.  Minimum leaks.   Daughter is getting married tomorrow No Known Allergies  Immunization History  Administered Date(s) Administered  . Influenza Whole 01/06/2009  . Influenza, High Dose Seasonal PF 06/21/2015, 01/06/2017  . Influenza-Unspecified 01/06/2014  . Pneumococcal Conjugate-13 11/07/2014  . Pneumococcal Polysaccharide-23 11/17/2013  . Tdap 08/01/2014    Past Medical History:  Diagnosis Date  . Atypical chest pain    LHC 08-03-01 nl coronaries and lv fn, lvedp 30.  cardiac workup by Dr. Harrington Challenger 5/09 neg cardiolite for ischemia, rec rsik reduction  . Chicken pox   . Diverticulosis    colonoscopy 9/01........Marland KitchenDr. Deatra Ina  . DM (diabetes mellitus) ([place])   . GERD (gastroesophageal reflux disease)   . Healthcare maintenance    pneumovax 09/2006, Td 04/2004, CPX June 12, 2010  . Hypertension   . Microcytic anemia   . Morbid obesity ([place])    all time high 310 2009.  target wt = 208 for BMI <30.  referred back to nutrition again June 12, 2010  . 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 Renal insufficiency    baseline 1.6 October 24, 2009 > 1.3 June 12, 2010  . Sleep apnea   . Thyroid disease     Tobacco History: Social History   Tobacco Use  Smoking Status Former Smoker  . Packs/day: 0.30  . Years: 1.00  . Pack years: 0.30  . Types: Cigarettes  . Last attempt to quit: 04/08/1985  . Years since quitting: 32.3  Smokeless Tobacco Never Used   Counseling given: Not Answered   Outpatient Encounter Medications as of 08/08/2017  Medication Sig  . acetaminophen (TYLENOL) 325 MG tablet Per bottle as needed   . Blood Glucose Monitoring Suppl (ONETOUCH VERIO IQ SYSTEM) w/Device KIT Use to check sugars 1-2 times per day  . Dulaglutide (TRULICITY) 5.57 DU/2.0UR SOPN 0.5 ml sq weekly  . empagliflozin (JARDIANCE) 25 MG TABS tablet Take 25 mg by mouth daily.  . furosemide (LASIX) 20 MG tablet Take 1 tablet (20 mg total) by mouth daily.  [name] glipiZIDE (GLUCOTROL XL) 2.5 MG 24 hr tablet Take 1 tablet (2.5 mg total) by mouth daily with breakfast.  . glucose blood (ONETOUCH VERIO) test strip Use to check sugars 1-2 times per day  . Lancets (ONETOUCH ULTRASOFT) lancets Use as instructed  . latanoprost (XALATAN) 0.005 % ophthalmic solution   .  levothyroxine (SYNTHROID, LEVOTHROID) 75 MCG tablet Take 1 tablet (75 mcg total) by mouth daily.  . metFORMIN (GLUCOPHAGE-XR) 500 MG 24 hr tablet Take 1 tablet (500 mg total) by mouth daily with breakfast.  . metoprolol succinate (TOPROL-XL) 25 MG 24 hr tablet Take 1 tablet (25 mg total) by mouth daily.  . minoxidil (LONITEN) 10 MG tablet TAKE 1 TABLET IN AM AND 1/2 TABLET IN PM  . omeprazole (PRILOSEC) 40 MG capsule Take 1 capsule (40 mg total) by mouth daily.  . rosuvastatin (CRESTOR) 10 MG tablet TAKE 1 TABLET BY MOUTH EVERY DAY  . sildenafil (VIAGRA) 100 MG tablet Take 1 tablet (100 mg total) by mouth daily as needed for erectile dysfunction.  .  valsartan-hydrochlorothiazide (DIOVAN-HCT) 320-25 MG tablet Take 1 tablet by mouth daily.  . verapamil (CALAN-SR) 240 MG CR tablet TAKE 1 TABLET BY MOUTH EVERY DAY   No facility-administered encounter medications on file as of 08/08/2017.      Review of Systems  Constitutional:   No  weight loss, night sweats,  Fevers, chills, fatigue, or  lassitude.  HEENT:   No headaches,  Difficulty swallowing,  Tooth/dental problems, or  Sore throat,                No sneezing, itching, ear ache, nasal congestion, post nasal drip,   CV:  No chest pain,  Orthopnea, PND, swelling in lower extremities, anasarca, dizziness, palpitations, syncope.   GI  No heartburn, indigestion, abdominal pain, nausea, vomiting, diarrhea, change in bowel habits, loss of appetite, bloody stools.   Resp: No shortness of breath with exertion or at rest.  No excess mucus, no productive cough,  No non-productive cough,  No coughing up of blood.  No change in color of mucus.  No wheezing.  No chest wall deformity  Skin: no rash or lesions.  GU: no dysuria, change in color of urine, no urgency or frequency.  No flank pain, no hematuria   MS:  No joint pain or swelling.  No decreased range of motion.  No back pain.    Physical Exam  BP 132/70 (BP Location: Left Arm, Cuff Size: Normal)   Pulse 75   Ht 5' 11"  (1.803 m)   Wt 276 lb (125.2 kg)   SpO2 97%   BMI 38.49 kg/m   GEN: A/Ox3; pleasant , NAD, well nourished , obese    HEENT:  [place]/AT,  EACs-clear, TMs-wnl, NOSE-clear, THROAT-clear, no lesions, no postnasal drip or exudate noted. Class 3 MP airway   NECK:  Supple w/ fair ROM; no JVD; normal carotid impulses w/o bruits; no thyromegaly or nodules palpated; no lymphadenopathy.    RESP  Clear  P & A; w/o, wheezes/ rales/ or rhonchi. no accessory muscle use, no dullness to percussion  CARD:  RRR, no m/r/g, no peripheral edema, pulses intact, no cyanosis or clubbing.  GI:   Soft & nt; nml bowel sounds; no  organomegaly or masses detected.   Musco: Warm bil, no deformities or joint swelling noted.   Neuro: alert, no focal deficits noted.    Skin: Warm, no lesions or rashes    Lab Results:    BNP No results found for: BNP   Imaging: No results found.   Assessment & Plan:   Obstructive sleep apnea Well-controlled on CPAP  Plan  Patient Instructions  Wear CPAP At bedtime   Need to wear for at least 4-6 hr each night.  Great job on weight loss .  Follow up  in 1 year with Dr. [name]  And As needed        Morbid obesity [facility]) Wt loss      Rexene Edison, NP 08/08/2017

## 2017-08-08 NOTE — Assessment & Plan Note (Signed)
Well-controlled on CPAP  Plan  Patient Instructions  Wear CPAP At bedtime   Need to wear for at least 4-6 hr each night.  Great job on weight loss .  Follow up in 1 year with Dr. [name]  And As needed

## 2017-08-08 NOTE — Telephone Encounter (Signed)
Faxed

## 2017-08-10 ENCOUNTER — Other Ambulatory Visit: Payer: Self-pay | Admitting: Family

## 2017-08-11 ENCOUNTER — Other Ambulatory Visit: Payer: Self-pay | Admitting: Internal Medicine

## 2017-08-11 ENCOUNTER — Other Ambulatory Visit: Payer: Self-pay

## 2017-08-11 NOTE — Telephone Encounter (Signed)
Copied from [place]. Topic: Quick Communication - Rx Refill/Question >> Aug 11, 2017  8:45 AM Aurelio Brash B wrote: Medication: metoprolol succinate (TOPROL-XL) 25 MG 24 hr tablet  PT going on vacation-  at pharmacy now   Has the patient contacted their pharmacy? Yes  (Agent: If no, request that the patient contact the pharmacy for the refill.)  Preferred Pharmacy (with phone number or street name): [id number] [address], [place] [phone] (Phone) [phone] (Fax)      Agent: Please be advised that RX refills may take up to 3 business days. We ask that you follow-up with your pharmacy.

## 2017-08-11 NOTE — Telephone Encounter (Signed)
Metoprolol refill (patient is out and going on vacation) Last OV: 07/29/17 Last Refill:40/10/18 90 tab/3 refill by another provider Pharmacy: [id number] [place], [place] [phone] (Phone) [phone] (Fax)

## 2017-08-12 MED ORDER — METOPROLOL SUCCINATE ER 25 MG PO TB24: 25.0000 mg | ORAL_TABLET | Freq: Every day | ORAL | 3 refills | Status: DC

## 2017-08-12 NOTE — Telephone Encounter (Signed)
Reviewed chart pt is up-to-date sent refills to [facility].../lmb  

## 2017-08-22 ENCOUNTER — Ambulatory Visit (AMBULATORY_SURGERY_CENTER): Payer: Medicare Other | Admitting: Gastroenterology

## 2017-08-22 ENCOUNTER — Other Ambulatory Visit: Payer: Self-pay

## 2017-08-22 VITALS — BP 108/60 | HR 72 | Resp 10 | Ht 71.0 in | Wt 275.0 lb

## 2017-08-22 DIAGNOSIS — D127 Benign neoplasm of rectosigmoid junction: Secondary | ICD-10-CM

## 2017-08-22 DIAGNOSIS — Z86010 Personal history of colonic polyps: Secondary | ICD-10-CM | POA: Diagnosis present

## 2017-08-22 DIAGNOSIS — D122 Benign neoplasm of ascending colon: Secondary | ICD-10-CM

## 2017-08-22 DIAGNOSIS — K635 Polyp of colon: Secondary | ICD-10-CM

## 2017-08-22 MED ORDER — SODIUM CHLORIDE 0.9 % IV SOLN: 500.0000 mL | Freq: Once | INTRAVENOUS | Status: DC

## 2017-08-22 NOTE — Progress Notes (Signed)
Called to room to assist during endoscopic procedure.  Patient ID and intended procedure confirmed with present staff. Received instructions for my participation in the procedure from the performing physician.  

## 2017-08-22 NOTE — Progress Notes (Signed)
Report to PACU, RN, vss, BBS= Clear.  

## 2017-08-22 NOTE — Patient Instructions (Signed)
Colon polyps removed today. Handout given on colon polyps,diverticulosis,and hemorrhoids. Result letter in your mail in 2-3 weeks. Resume current medications. Call us with any questions or concerns. Thank you!    YOU HAD AN ENDOSCOPIC PROCEDURE TODAY AT [facility]:   Refer to the procedure report that was given to you for any specific questions about what was found during the examination.  If the procedure report does not answer your questions, please call your gastroenterologist to clarify.  If you requested that your care partner not be given the details of your procedure findings, then the procedure report has been included in a sealed envelope for you to review at your convenience later.  YOU SHOULD EXPECT: Some feelings of bloating in the abdomen. Passage of more gas than usual.  Walking can help get rid of the air that was put into your GI tract during the procedure and reduce the bloating. If you had a lower endoscopy (such as a colonoscopy or flexible sigmoidoscopy) you may notice spotting of blood in your stool or on the toilet paper. If you underwent a bowel prep for your procedure, you may not have a normal bowel movement for a few days.  Please Note:  You might notice some irritation and congestion in your nose or some drainage.  This is from the oxygen used during your procedure.  There is no need for concern and it should clear up in a day or so.  SYMPTOMS TO REPORT IMMEDIATELY:   Following lower endoscopy (colonoscopy or flexible sigmoidoscopy):  Excessive amounts of blood in the stool  Significant tenderness or worsening of abdominal pains  Swelling of the abdomen that is new, acute  Fever of 100F or higher  For urgent or emergent issues, a gastroenterologist can be reached at any hour by calling [phone].   DIET:  We do recommend a small meal at first, but then you may proceed to your regular diet.  Drink plenty of fluids but you should avoid  alcoholic beverages for 24 hours.  ACTIVITY:  You should plan to take it easy for the rest of today and you should NOT DRIVE or use heavy machinery until tomorrow (because of the sedation medicines used during the test).    FOLLOW UP: Our staff will call the number listed on your records the next business day following your procedure to check on you and address any questions or concerns that you may have regarding the information given to you following your procedure. If we do not reach you, we will leave a message.  However, if you are feeling well and you are not experiencing any problems, there is no need to return our call.  We will assume that you have returned to your regular daily activities without incident.  If any biopsies were taken you will be contacted by phone or by letter within the next 1-3 weeks.  Please call us at [phone] if you have not heard about the biopsies in 3 weeks.    SIGNATURES/CONFIDENTIALITY: You and/or your care partner have signed paperwork which will be entered into your electronic medical record.  These signatures attest to the fact that that the information above on your After Visit Summary has been reviewed and is understood.  Full responsibility of the confidentiality of this discharge information lies with you and/or your care-partner.

## 2017-08-22 NOTE — Op Note (Signed)
[facility] Patient Name: Matthew Keller Procedure Date: 08/22/2017 8:32 AM MRN: [id number] Endoscopist: Remo Lipps P. Tyrian Peart MD, MD Age: 67 Referring MD:  Date of Birth: 1950/05/28 Gender: Male Account #: [id number] Procedure:                Colonoscopy Indications:              Surveillance: Personal history of adenomatous                            polyps on last colonoscopy > 5 years ago Medicines:                Monitored Anesthesia Care Procedure:                Pre-Anesthesia Assessment:                           - Prior to the procedure, a History and Physical                            was performed, and patient medications and                            allergies were reviewed. The patient's tolerance of                            previous anesthesia was also reviewed. The risks                            and benefits of the procedure and the sedation                            options and risks were discussed with the patient.                            All questions were answered, and informed consent                            was obtained. Prior Anticoagulants: The patient has                            taken no previous anticoagulant or antiplatelet                            agents. ASA Grade Assessment: II - A patient with                            mild systemic disease. After reviewing the risks                            and benefits, the patient was deemed in                            satisfactory condition to undergo the procedure.  After obtaining informed consent, the colonoscope                            was passed under direct vision. Throughout the                            procedure, the patient's blood pressure, pulse, and                            oxygen saturations were monitored continuously. The                            Colonoscope was introduced through the anus and                            advanced to the the  cecum, identified by                            appendiceal orifice and ileocecal valve. The                            colonoscopy was performed without difficulty. The                            patient tolerated the procedure well. The quality                            of the bowel preparation was good. The ileocecal                            valve, appendiceal orifice, and rectum were                            photographed. Scope In: 8:36:42 AM Scope Out: 8:54:52 AM Scope Withdrawal Time: 0 hours 15 minutes 51 seconds  Total Procedure Duration: 0 hours 18 minutes 10 seconds  Findings:                 The perianal and digital rectal examinations were                            normal.                           A 3 mm polyp was found in the descending colon. The                            polyp was sessile. The polyp was removed with a                            cold snare. Resection and retrieval were complete.                           A 3 mm polyp was found in the recto-sigmoid colon.  The polyp was sessile. The polyp was removed with a                            cold snare. Resection and retrieval were complete.                           A few medium-mouthed diverticula were found in the                            ascending colon and left colon.                           Internal hemorrhoids were found during retroflexion.                           The exam was otherwise without abnormality. Complications:            No immediate complications. Estimated blood loss:                            Minimal. Estimated Blood Loss:     Estimated blood loss was minimal. Impression:               - One 3 mm polyp in the descending colon, removed                            with a cold snare. Resected and retrieved.                           - One 3 mm polyp at the recto-sigmoid colon,                            removed with a cold snare. Resected and retrieved.                            - Diverticulosis in the ascending colon and in the                            left colon.                           - Internal hemorrhoids.                           - The examination was otherwise normal. Recommendation:           - Patient has a contact number available for                            emergencies. The signs and symptoms of potential                            delayed complications were discussed with the  patient. Return to normal activities tomorrow.                            Written discharge instructions were provided to the                            patient.                           - Resume previous diet.                           - Continue present medications.                           - Await pathology results.                           - Repeat colonoscopy for surveillance based on                            pathology results. Remo Lipps P. Brodrick Curran MD, MD 08/22/2017 8:58:09 AM This report has been signed electronically.

## 2017-08-22 NOTE — Progress Notes (Signed)
I have reviewed the patient's medical history in detail and updated the computerized patient record.

## 2017-08-25 ENCOUNTER — Telehealth: Payer: Self-pay

## 2017-08-25 NOTE — Telephone Encounter (Signed)
  Follow up Call-  Call back number 08/22/2017  Post procedure Call Back phone  # [phone]  Permission to leave phone message Yes  Some recent data might be hidden     Patient questions:  Do you have a fever, pain , or abdominal swelling? No. Pain Score  0 *  Have you tolerated food without any problems? Yes.    Have you been able to return to your normal activities? Yes.    Do you have any questions about your discharge instructions: Diet   No. Medications  No. Follow up visit  No.  Do you have questions or concerns about your Care? No.  Actions: * If pain score is 4 or above: No action needed, pain <4.

## 2017-08-28 ENCOUNTER — Encounter: Payer: Self-pay | Admitting: Gastroenterology

## 2017-10-01 ENCOUNTER — Other Ambulatory Visit: Payer: Self-pay | Admitting: Adult Health

## 2017-11-05 ENCOUNTER — Other Ambulatory Visit: Payer: Self-pay | Admitting: Internal Medicine

## 2017-11-12 ENCOUNTER — Other Ambulatory Visit: Payer: Self-pay | Admitting: Internal Medicine

## 2017-11-28 ENCOUNTER — Other Ambulatory Visit: Payer: Self-pay | Admitting: Internal Medicine

## 2017-12-23 ENCOUNTER — Other Ambulatory Visit: Payer: Self-pay | Admitting: Family

## 2018-01-01 ENCOUNTER — Other Ambulatory Visit: Payer: Self-pay | Admitting: Internal Medicine

## 2018-01-05 ENCOUNTER — Other Ambulatory Visit: Payer: Self-pay | Admitting: Internal Medicine

## 2018-01-10 IMAGING — NM NM MISC PROCEDURE
9 series · 54 of 54 positions shown · non-contrast
Comparison: none

[Series 1: stress sax gs · 6.4mm · 6.40mm/px · 6 of 216 frames shown]
[frame 19/216]
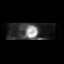
[frame 55/216]
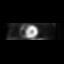
[frame 91/216]
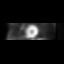
[frame 127/216]
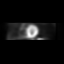
[frame 163/216]
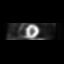
[frame 199/216]
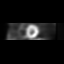

[Series 1: wbr_s-proj_st wbr stress-gsp · 6.40mm/px · 6 of 512 frames shown]
[frame 43/512]
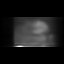
[frame 128/512]
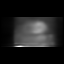
[frame 214/512]
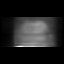
[frame 299/512]
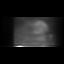
[frame 384/512]
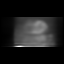
[frame 470/512]
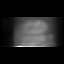

[Series 1: wbr stress-gsp · 6.40mm/px · 6 of 510 frames shown]
[frame 43/510]
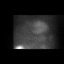
[frame 128/510]
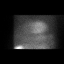
[frame 213/510]
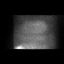
[frame 298/510]
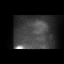
[frame 383/510]
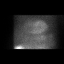
[frame 468/510]
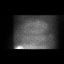

[Series 1: stress sax · 6.4mm · 6.40mm/px · 6 of 27 frames shown]
[frame 3/27]
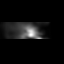
[frame 7/27]
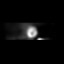
[frame 12/27]
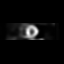
[frame 16/27]
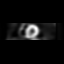
[frame 21/27]
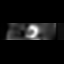
[frame 25/27]
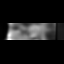

[Series 2: wbr_s-proj_st wbr stress-sum-em · 6.40mm/px · 6 of 64 frames shown]
[frame 6/64]
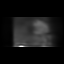
[frame 16/64]
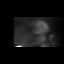
[frame 27/64]
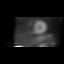
[frame 38/64]
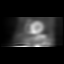
[frame 48/64]
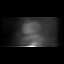
[frame 59/64]
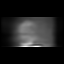

[Series 2: wbr stress-sum-em · 6.40mm/px · 6 of 64 frames shown]
[frame 6/64]
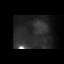
[frame 16/64]
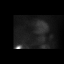
[frame 27/64]
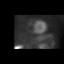
[frame 38/64]
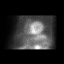
[frame 48/64]
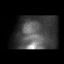
[frame 59/64]
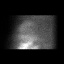

[Series 3: wbr rest · 6.40mm/px · 6 of 64 frames shown]
[frame 6/64]
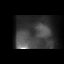
[frame 16/64]
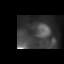
[frame 27/64]
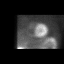
[frame 38/64]
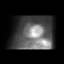
[frame 48/64]
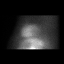
[frame 59/64]
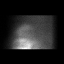

[Series 3: wbr_r-proj_st wbr rest · 6.40mm/px · 6 of 64 frames shown]
[frame 6/64]
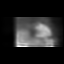
[frame 16/64]
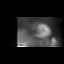
[frame 27/64]
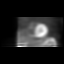
[frame 38/64]
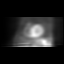
[frame 48/64]
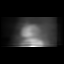
[frame 59/64]
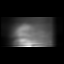

[Series 3: rest sax · 6.4mm · 6.40mm/px · 6 of 27 frames shown]
[frame 3/27]
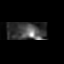
[frame 7/27]
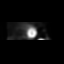
[frame 12/27]
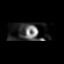
[frame 16/27]
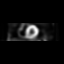
[frame 21/27]
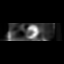
[frame 25/27]
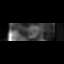

[54 of 54 positions shown; findings below may reference images not displayed]

Canned report from images found in remote index.

Refer to host system for actual result text.

## 2018-01-18 ENCOUNTER — Other Ambulatory Visit: Payer: Self-pay | Admitting: Internal Medicine

## 2018-01-20 ENCOUNTER — Ambulatory Visit: Payer: Medicare Other | Admitting: Internal Medicine

## 2018-01-29 ENCOUNTER — Ambulatory Visit: Payer: Medicare Other | Admitting: Internal Medicine

## 2018-02-08 ENCOUNTER — Other Ambulatory Visit: Payer: Self-pay | Admitting: Internal Medicine

## 2018-02-10 ENCOUNTER — Other Ambulatory Visit: Payer: Self-pay | Admitting: Internal Medicine

## 2018-02-21 ENCOUNTER — Other Ambulatory Visit: Payer: Self-pay | Admitting: Internal Medicine

## 2018-02-25 ENCOUNTER — Ambulatory Visit: Payer: Medicare Other | Admitting: Internal Medicine

## 2018-02-25 ENCOUNTER — Encounter: Payer: Self-pay | Admitting: Internal Medicine

## 2018-02-25 ENCOUNTER — Other Ambulatory Visit (INDEPENDENT_AMBULATORY_CARE_PROVIDER_SITE_OTHER): Payer: Medicare Other

## 2018-02-25 VITALS — BP 142/88 | HR 88 | Temp 98.0°F | Ht 71.0 in | Wt 282.0 lb

## 2018-02-25 DIAGNOSIS — E1159 Type 2 diabetes mellitus with other circulatory complications: Secondary | ICD-10-CM | POA: Diagnosis not present

## 2018-02-25 DIAGNOSIS — E1165 Type 2 diabetes mellitus with hyperglycemia: Secondary | ICD-10-CM

## 2018-02-25 DIAGNOSIS — E785 Hyperlipidemia, unspecified: Secondary | ICD-10-CM | POA: Diagnosis not present

## 2018-02-25 DIAGNOSIS — I10 Essential (primary) hypertension: Secondary | ICD-10-CM

## 2018-02-25 DIAGNOSIS — Z0000 Encounter for general adult medical examination without abnormal findings: Secondary | ICD-10-CM | POA: Diagnosis not present

## 2018-02-25 LAB — BASIC METABOLIC PANEL
BUN: 19 mg/dL (ref 6–23)
CO2: 31 mEq/L (ref 19–32)
CREATININE: 1.7 mg/dL — AB (ref 0.40–1.50)
Calcium: 10.5 mg/dL (ref 8.4–10.5)
Chloride: 101 mEq/L (ref 96–112)
GFR: 51.66 mL/min — AB (ref >60.00)
GLUCOSE: 223 mg/dL — AB (ref 70–99)
Potassium: 3.8 mEq/L (ref 3.5–5.1)
Sodium: 140 mEq/L (ref 135–145)

## 2018-02-25 LAB — HEPATIC FUNCTION PANEL
ALT: 24 U/L (ref 0–53)
AST: 21 U/L (ref 0–37)
Albumin: 4.1 g/dL (ref 3.5–5.2)
Alkaline Phosphatase: 82 U/L (ref 39–117)
BILIRUBIN DIRECT: 0.1 mg/dL (ref 0.0–0.3)
BILIRUBIN TOTAL: 0.8 mg/dL (ref 0.2–1.2)
TOTAL PROTEIN: 7.7 g/dL (ref 6.0–8.3)

## 2018-02-25 LAB — LIPID PANEL
CHOLESTEROL: 107 mg/dL (ref 0–200)
HDL: 40.3 mg/dL (ref >39.00)
LDL CALC: 45 mg/dL (ref 0–99)
NONHDL: 66.44
Total CHOL/HDL Ratio: 3
Triglycerides: 107 mg/dL (ref 0.0–149.0)
VLDL: 21.4 mg/dL (ref 0.0–40.0)

## 2018-02-25 LAB — HEMOGLOBIN A1C: HEMOGLOBIN A1C: 7.6 % — AB (ref 4.6–6.5)

## 2018-02-25 NOTE — Assessment & Plan Note (Signed)
Lab Results  Component Value Date   LDLCALC 45 02/25/2018  stable overall by history and exam, recent data reviewed with pt, and pt to continue medical treatment as before,  to f/u any worsening symptoms or concerns

## 2018-02-25 NOTE — Progress Notes (Signed)
Subjective:    Patient ID: Matthew Keller, male    DOB: 12-08-1950, 67 y.o.   MRN: [id number]  HPI  Here to f/u; overall doing ok,  Pt denies chest pain, increasing sob or doe, wheezing, orthopnea, PND, increased LE swelling, palpitations, dizziness or syncope.  Pt denies new neurological symptoms such as new headache, or facial or extremity weakness or numbness.  Pt denies polydipsia, polyuria, or low sugar episode.  Pt states overall good compliance with meds, mostly trying to follow appropriate diet, with wt overall stable,  but little exercise however.  BP at home < 140/90 per pt.  No new complaints Wt Readings from Last 3 Encounters:  02/25/18 282 lb (127.9 kg)  08/22/17 275 lb (124.7 kg)  08/08/17 276 lb (125.2 kg)   Past Medical History:  Diagnosis Date  . Atypical chest pain    [facility] 08-03-01 nl coronaries and lv fn, lvedp 30.  cardiac workup by Dr. Harrington Challenger 5/09 neg cardiolite for ischemia, rec rsik reduction  . Chicken pox   . Diverticulosis    colonoscopy 9/01........Marland KitchenDr. Deatra Ina  . DM (diabetes mellitus) ([place])   . GERD (gastroesophageal reflux disease)   . Healthcare maintenance    pneumovax 09/2006, Td 04/2004, CPX June 12, 2010  . Hypertension   . Microcytic anemia   . Morbid obesity ([place])    all time high 310 2009.  target wt = 208 for BMI <30.  referred back to nutrition again June 12, 2010  . 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 Renal insufficiency    baseline 1.6 October 24, 2009 > 1.3 June 12, 2010  . Sleep apnea   . Thyroid disease    Past Surgical History:  Procedure Laterality Date  . KNEE SURGERY      reports that he quit smoking about 32 years ago. His smoking use included cigarettes. He has a 0.30 pack-year smoking history. He has never used smokeless tobacco. He reports that he drinks alcohol. He reports that he does not use drugs. family history includes Alcohol abuse in his brother and  father; Diabetes in his mother; Throat cancer in his brother. No Known Allergies Current Outpatient Medications on File Prior to Visit  Medication Sig Dispense Refill  . acetaminophen (TYLENOL) 325 MG tablet Per bottle as needed     . Blood Glucose Monitoring Suppl (ONETOUCH VERIO IQ SYSTEM) w/Device KIT Use to check sugars 1-2 times per day 1 kit 0  . Dulaglutide (TRULICITY) 9.39 QZ/0.0PQ SOPN 0.5 ml sq weekly 6 mL 3  . furosemide (LASIX) 20 MG tablet Take 1 tablet (20 mg total) by mouth daily. 90 tablet 3  . glipiZIDE (GLUCOTROL XL) 2.5 MG 24 hr tablet Take 1 tablet (2.5 mg total) by mouth daily with breakfast. 90 tablet 3  . glucose blood (ONETOUCH VERIO) test strip Use to check sugars 1-2 times per day 100 each 12  . JARDIANCE 25 MG TABS tablet TAKE 1 TABLET BY MOUTH EVERY DAY 90 tablet 1  . Lancets (ONETOUCH ULTRASOFT) lancets Use as instructed 100 each 12  . latanoprost (XALATAN) 0.005 % ophthalmic solution     . levothyroxine (SYNTHROID, LEVOTHROID) 75 MCG tablet Take 1 tablet (75 mcg total) by mouth daily. 30 tablet 0  . metFORMIN (GLUCOPHAGE-XR) 500 MG 24 hr tablet Take 1 tablet (500 mg total) by mouth daily with breakfast. 180 tablet 3  . metoprolol succinate (TOPROL-XL) 25  MG 24 hr tablet Take 1 tablet (25 mg total) by mouth daily. 90 tablet 3  . minoxidil (LONITEN) 10 MG tablet TAKE 1 TABLET IN THE MORNING AND 1/2 TABLET IN THE EVENING 60 tablet 1  . omeprazole (PRILOSEC) 40 MG capsule Take 1 capsule (40 mg total) by mouth daily. 90 capsule 3  . rosuvastatin (CRESTOR) 10 MG tablet TAKE 1 TABLET BY MOUTH EVERY DAY 90 tablet 1  . sildenafil (VIAGRA) 100 MG tablet Take 1 tablet (100 mg total) by mouth daily as needed for erectile dysfunction. 9 tablet 0  . valsartan-hydrochlorothiazide (DIOVAN-HCT) 320-25 MG tablet Take 1 tablet by mouth daily. 30 tablet 2  . verapamil (CALAN-SR) 240 MG CR tablet TAKE 1 TABLET BY MOUTH EVERY DAY 90 tablet 0   No current facility-administered  medications on file prior to visit.    Review of Systems  Constitutional: Negative for other unusual diaphoresis or sweats HENT: Negative for ear discharge or swelling Eyes: Negative for other worsening visual disturbances Respiratory: Negative for stridor or other swelling  Gastrointestinal: Negative for worsening distension or other blood Genitourinary: Negative for retention or other urinary change Musculoskeletal: Negative for other MSK pain or swelling Skin: Negative for color change or other new lesions Neurological: Negative for worsening tremors and other numbness  Psychiatric/Behavioral: Negative for worsening agitation or other fatigue All other system neg per pt    Objective:   Physical Exam BP (!) 142/88   Pulse 88   Temp 98 F (36.7 C) (Oral)   Ht 5' 11"  (1.803 m)   Wt 282 lb (127.9 kg)   SpO2 96%   BMI 39.33 kg/m  VS noted,  Constitutional: Pt appears in NAD HENT: Head: NCAT.  Right Ear: External ear normal.  Left Ear: External ear normal.  Eyes: . Pupils are equal, round, and reactive to light. Conjunctivae and EOM are normal Nose: without d/c or deformity Neck: Neck supple. Gross normal ROM Cardiovascular: Normal rate and regular rhythm.   Pulmonary/Chest: Effort normal and breath sounds without rales or wheezing.  Abd:  Soft, NT, ND, + BS, no organomegaly Neurological: Pt is alert. At baseline orientation, motor grossly intact Skin: Skin is warm. No rashes, other new lesions, no LE edema Psychiatric: Pt behavior is normal without agitation  No other exam findings Lab Results  Component Value Date   WBC 9.0 07/29/2017   HGB 12.4 (L) 07/29/2017   HCT 36.2 (L) 07/29/2017   PLT 274.0 07/29/2017   GLUCOSE 223 (H) 02/25/2018   CHOL 107 02/25/2018   TRIG 107.0 02/25/2018   HDL 40.30 02/25/2018   LDLCALC 45 02/25/2018   ALT 24 02/25/2018   AST 21 02/25/2018   NA 140 02/25/2018   K 3.8 02/25/2018   CL 101 02/25/2018   CREATININE 1.70 (H) 02/25/2018    BUN 19 02/25/2018   CO2 31 02/25/2018   TSH 2.65 07/29/2017   PSA 0.23 07/29/2017   HGBA1C 7.6 (H) 02/25/2018   MICROALBUR <0.7 07/29/2017          Assessment & Plan:

## 2018-02-25 NOTE — Assessment & Plan Note (Signed)
stable overall by history and exam, recent data reviewed with pt, and pt to continue medical treatment as before,  to f/u any worsening symptoms or concerns  

## 2018-02-25 NOTE — Assessment & Plan Note (Addendum)
Lab Results  Component Value Date   HGBA1C 7.6 (H) 02/25/2018  stable overall by history and exam, recent data reviewed with pt, and pt to continue medical treatment as before,  to f/u any worsening symptoms or concerns, declines further med increase or change to achieve goal A1c < 7

## 2018-02-25 NOTE — Patient Instructions (Signed)

## 2018-02-26 ENCOUNTER — Other Ambulatory Visit: Payer: Self-pay | Admitting: Internal Medicine

## 2018-02-26 ENCOUNTER — Encounter: Payer: Self-pay | Admitting: Internal Medicine

## 2018-02-26 MED ORDER — DULAGLUTIDE 1.5 MG/0.5ML ~~LOC~~ SOAJ: 0.5000 mL | SUBCUTANEOUS | 3 refills | Status: DC

## 2018-02-27 ENCOUNTER — Telehealth: Payer: Self-pay

## 2018-02-27 NOTE — Telephone Encounter (Signed)
Pt has been informed of results and expressed understanding.  °

## 2018-02-27 NOTE — Telephone Encounter (Signed)
-----   Message from Biagio Borg, MD sent at 02/26/2018  7:30 PM EST ----- Letter sent, cont same tx except  The test results show that your current treatment is OK, although the A1c is still mildly elevated, although it is the best in quite some time.  Please increase the Trulicity to 1.5 weekly.  I will send the prescription, and you should hear from the office as well.    Cearra Portnoy to please inform pt, I will do rx

## 2018-03-19 ENCOUNTER — Other Ambulatory Visit: Payer: Self-pay | Admitting: Internal Medicine

## 2018-03-19 NOTE — Telephone Encounter (Signed)
Copied from [place] [id number]. Topic: Quick Communication - Rx Refill/Question >> Mar 19, 2018  3:21 PM Yvette Rack wrote: Medication:    valsartan-hydrochlorothiazide (DIOVAN-HCT) 320-25 MG tablet  on back order Pt been out of medicine for three days  Pt would like sample until RX has went through    Has the patient contacted their pharmacy? Yes.   (Agent: If no, request that the patient contact the pharmacy for the refill.) (Agent: If yes, when and what did the pharmacy advise?) pharmacy states that they had been waiting on a call back from provider they called provider a few days ago  Preferred Pharmacy (with phone number or street name):   [id number] [address], [place] [phone] (Phone) [phone] (Fax)    Agent: Please be advised that RX refills may take up to 3 business days. We ask that you follow-up with your pharmacy.

## 2018-03-20 MED ORDER — VALSARTAN-HYDROCHLOROTHIAZIDE 320-25 MG PO TABS: 1.0000 | ORAL_TABLET | Freq: Every day | ORAL | 1 refills | Status: DC

## 2018-03-23 ENCOUNTER — Other Ambulatory Visit: Payer: Self-pay | Admitting: Internal Medicine

## 2018-03-23 NOTE — Telephone Encounter (Signed)
Ok for change diovan hct to losartan 100 qd

## 2018-04-13 ENCOUNTER — Ambulatory Visit: Payer: Self-pay

## 2018-04-13 NOTE — Telephone Encounter (Signed)
Outgoing call too Patient.  Who states that he is on Losartan, was on Diovan- HCT 320 mg. Patient was wondering if Dr.  Cathlean Cower intends to keep him on Larsartan , or If he will put him back the Diovan.   Patient request Ahmed Prima but also wants what is best for him.  Patient request a return phone call from Provider relating to this.     Reason for Disposition . Caller has medication question only, adult not sick, and triager answers question  Answer Assessment - Initial Assessment Questions 1. SYMPTOMS: "Do you have any symptoms?"     No  2. SEVERITY: If symptoms are present, ask "Are they mild, moderate or severe?"     na  Protocols used: MEDICATION QUESTION CALL-A-AH

## 2018-04-14 NOTE — Telephone Encounter (Signed)
Pt has been informed and expressed understanding.  

## 2018-04-14 NOTE — Telephone Encounter (Signed)
Ok to continue with the losartan, as the valsartan has had major problems in manufacturing in the past year and is generally not available in pharmacies

## 2018-04-16 ENCOUNTER — Other Ambulatory Visit: Payer: Self-pay | Admitting: Internal Medicine

## 2018-06-09 ENCOUNTER — Other Ambulatory Visit: Payer: Self-pay | Admitting: Internal Medicine

## 2018-06-10 ENCOUNTER — Other Ambulatory Visit: Payer: Self-pay | Admitting: Internal Medicine

## 2018-07-06 ENCOUNTER — Other Ambulatory Visit: Payer: Self-pay | Admitting: Internal Medicine

## 2018-07-21 ENCOUNTER — Telehealth: Payer: Self-pay | Admitting: *Deleted

## 2018-07-21 NOTE — Telephone Encounter (Signed)
Called patient to schedule AWV, patient scheduled for 07/22/18.

## 2018-07-22 ENCOUNTER — Ambulatory Visit (INDEPENDENT_AMBULATORY_CARE_PROVIDER_SITE_OTHER): Payer: Medicare Other | Admitting: *Deleted

## 2018-07-22 DIAGNOSIS — Z0000 Encounter for general adult medical examination without abnormal findings: Secondary | ICD-10-CM | POA: Diagnosis not present

## 2018-07-22 NOTE — Patient Instructions (Signed)
Continue doing brain stimulating activities (puzzles, reading, adult coloring books, staying active) to keep memory sharp.   Continue to eat heart healthy diet (full of fruits, vegetables, whole grains, lean protein, water--limit salt, fat, and sugar intake) and increase physical activity as tolerated.   Mr. Matthew Keller , Thank you for taking time to come for your Medicare Wellness Visit. I appreciate your ongoing commitment to your health goals. Please review the following plan we discussed and let me know if I can assist you in the future.   These are the goals we discussed: Goals    . Patient Stated     Continue to lose weight by eating healthy and by exercising routinely.        This is a list of the screening recommended for you and due dates:  Health Maintenance  Topic Date Due  . Eye exam for diabetics  02/20/2017  . Complete foot exam   07/30/2018  . Hemoglobin A1C  08/26/2018  . Flu Shot  11/07/2018  . Pneumonia vaccines (2 of 2 - PPSV23) 11/18/2018  . Tetanus Vaccine  07/31/2024  . Colon Cancer Screening  08/23/2027  .  Hepatitis C: One time screening is recommended by Center for Disease Control  (CDC) for  adults born from [birth year] through 1967.   Completed    Preventive Care 9 Years and Older, Male Preventive care refers to lifestyle choices and visits with your health care provider that can promote health and wellness. What does preventive care include?   A yearly physical exam. This is also called an annual well check.  Dental exams once or twice a year.  Routine eye exams. Ask your health care provider how often you should have your eyes checked.  Personal lifestyle choices, including: ? Daily care of your teeth and gums. ? Regular physical activity. ? Eating a healthy diet. ? Avoiding tobacco and drug use. ? Limiting alcohol use. ? Practicing safe sex. ? Taking low doses of aspirin every day. ? Taking vitamin and mineral supplements as recommended by your health  care provider. What happens during an annual well check? The services and screenings done by your health care provider during your annual well check will depend on your age, overall health, lifestyle risk factors, and family history of disease. Counseling Your health care provider may ask you questions about your:  Alcohol use.  Tobacco use.  Drug use.  Emotional well-being.  Home and relationship well-being.  Sexual activity.  Eating habits.  History of falls.  Memory and ability to understand (cognition).  Work and work Statistician. Screening You may have the following tests or measurements:  Height, weight, and BMI.  Blood pressure.  Lipid and cholesterol levels. These may be checked every 5 years, or more frequently if you are over 97 years old.  Skin check.  Lung cancer screening. You may have this screening every year starting at age 67 if you have a 30-pack-year history of smoking and currently smoke or have quit within the past 15 years.  Colorectal cancer screening. All adults should have this screening starting at age 25 and continuing until age 63. You will have tests every 1-10 years, depending on your results and the type of screening test. People at increased risk should start screening at an earlier age. Screening tests may include: ? Guaiac-based fecal occult blood testing. ? Fecal immunochemical test (FIT). ? Stool DNA test. ? Virtual colonoscopy. ? Sigmoidoscopy. During this test, a flexible tube with a tiny camera (  sigmoidoscope) is used to examine your rectum and lower colon. The sigmoidoscope is inserted through your anus into your rectum and lower colon. ? Colonoscopy. During this test, a long, thin, flexible tube with a tiny camera (colonoscope) is used to examine your entire colon and rectum.  Prostate cancer screening. Recommendations will vary depending on your family history and other risks.  Hepatitis C blood test.  Hepatitis B blood test.   Sexually transmitted disease (STD) testing.  Diabetes screening. This is done by checking your blood sugar (glucose) after you have not eaten for a while (fasting). You may have this done every 1-3 years.  Abdominal aortic aneurysm (AAA) screening. You may need this if you are a current or former smoker.  Osteoporosis. You may be screened starting at age 79 if you are at high risk. Talk with your health care provider about your test results, treatment options, and if necessary, the need for more tests. Vaccines Your health care provider may recommend certain vaccines, such as:  Influenza vaccine. This is recommended every year.  Tetanus, diphtheria, and acellular pertussis (Tdap, Td) vaccine. You may need a Td booster every 10 years.  Varicella vaccine. You may need this if you have not been vaccinated.  Zoster vaccine. You may need this after age 61.  Measles, mumps, and rubella (MMR) vaccine. You may need at least one dose of MMR if you were born in 1957 or later. You may also need a second dose.  Pneumococcal 13-valent conjugate (PCV13) vaccine. One dose is recommended after age 73.  Pneumococcal polysaccharide (PPSV23) vaccine. One dose is recommended after age 3.  Meningococcal vaccine. You may need this if you have certain conditions.  Hepatitis A vaccine. You may need this if you have certain conditions or if you travel or work in places where you may be exposed to hepatitis A.  Hepatitis B vaccine. You may need this if you have certain conditions or if you travel or work in places where you may be exposed to hepatitis B.  Haemophilus influenzae type b (Hib) vaccine. You may need this if you have certain risk factors. Talk to your health care provider about which screenings and vaccines you need and how often you need them. This information is not intended to replace advice given to you by your health care provider. Make sure you discuss any questions you have with your  health care provider. Document Released: 04/21/2015 Document Revised: 05/15/2017 Document Reviewed: 01/24/2015 Elsevier Interactive Patient Education  2019 [employer].

## 2018-07-22 NOTE — Progress Notes (Addendum)
Subjective:   Matthew Keller is a 68 y.o. male who presents for Medicare Annual/Subsequent preventive examination.  I connected with patient 07/22/18 at 10:00 AM EDT by a video enabled telemedicine application and verified that I am speaking with the correct person using two identifiers. Patient stated full name and DOB. Patient gave permission to continue with virtual visit. Patient's location was at home and Nurse's location was at [place] office.   Review of Systems:  No ROS.  Medicare Wellness Visit. Additional risk factors are reflected in the social history.  Cardiac Risk Factors include: advanced age (>65mn, >>69women);diabetes mellitus;dyslipidemia;hypertension;obesity (BMI >30kg/m2) Sleep patterns: feels rested on waking, gets up 0-1 times nightly to void and sleeps 7 hours nightly. Wears C-PAP routinely.  Home Safety/Smoke Alarms: Feels safe in home. Smoke alarms in place.  Living environment; residence and Firearm Safety: 1-story house/ trailer. Lives with wife, no needs for DME, good support system Seat Belt Safety/Bike Helmet: Wears seat belt.   PSA-  Lab Results  Component Value Date   PSA 0.23 07/29/2017   PSA 0.25 07/17/2016   PSA 0.29 11/07/2014       Objective:    Vitals: There were no vitals taken for this visit.  There is no height or weight on file to calculate BMI.  Advanced Directives 07/22/2018 08/22/2017 08/01/2014 08/01/2014  Does Patient Have a Medical Advance Directive? Yes No Yes No  Type of AParamedicof ABenaLiving will - HShip Bottom[place] will -  Copy of HWisnerin Chart? No - copy requested - - -    Tobacco Social History   Tobacco Use  Smoking Status Former Smoker  . Packs/day: 0.30  . Years: 1.00  . Pack years: 0.30  . Types: Cigarettes  . Last attempt to quit: 04/08/1985  . Years since quitting: 33.3  Smokeless Tobacco Never Used     Counseling given: Not Answered   Past Medical History:  Diagnosis Date  . Atypical chest pain    LHC 08-03-01 nl coronaries and lv fn, lvedp 30.  cardiac workup by Dr. RHarrington Challenger5/09 neg cardiolite for ischemia, rec rsik reduction  . Chicken pox   . Diverticulosis    colonoscopy 9/01.........Marland Kitchenr. KDeatra Ina . DM (diabetes mellitus) ([place]   . GERD (gastroesophageal reflux disease)   . Healthcare maintenance    pneumovax 09/2006, Td 04/2004, CPX June 12, 2010  . Hypertension   . Microcytic anemia   . Morbid obesity ([place]    all time high 310 2009.  target wt = 208 for BMI <30.  referred back to nutrition again June 12, 2010  . OSA (obstructive sleep apnea)    on CPAP, sleep study 09/2007.............Marland Kitchenr. 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 Renal insufficiency    baseline 1.6 October 24, 2009 > 1.3 June 12, 2010  . Sleep apnea   . Thyroid disease    Past Surgical History:  Procedure Laterality Date  . KNEE SURGERY     Family History  Problem Relation Age of Onset  . Diabetes Mother   . Alcohol abuse Father   . Throat cancer Brother        smoker  . Alcohol abuse Brother   . Colon cancer Neg Hx   . Esophageal cancer Neg Hx   . Liver cancer Neg Hx   . Pancreatic cancer Neg Hx   . Rectal cancer Neg Hx   . Stomach cancer Neg Hx  Social History   Socioeconomic History  . Marital status: Married    Spouse name: Not on file  . Number of children: 2  . Years of education: 78  . Highest education level: Not on file  Occupational History  . Occupation: Retired    Fish farm manager: [employer]  . Financial resource strain: Not hard at all  . Food insecurity:    Worry: Never true    Inability: Never true  . Transportation needs:    Medical: No    Non-medical: No  Tobacco Use  . Smoking status: Former Smoker    Packs/day: 0.30    Years: 1.00    Pack years: 0.30    Types: Cigarettes    Last attempt to quit: 04/08/1985    Years since quitting: 33.3  . Smokeless tobacco: Never Used  Substance and  Sexual Activity  . Alcohol use: Yes    Comment: occasional  . Drug use: No  . Sexual activity: Yes  Lifestyle  . Physical activity:    Days per week: 4 days    Minutes per session: 40 min  . Stress: Not at all  Relationships  . Social connections:    Talks on phone: More than three times a week    Gets together: More than three times a week    Attends religious service: More than 4 times per year    Active member of club or organization: Yes    Attends meetings of clubs or organizations: More than 4 times per year    Relationship status: Married  Other Topics Concern  . 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     Outpatient Encounter Medications as of 07/22/2018  Medication Sig  . acetaminophen (TYLENOL) 325 MG tablet Per bottle as needed   . Blood Glucose Monitoring Suppl (ONETOUCH VERIO IQ SYSTEM) w/Device KIT Use to check sugars 1-2 times per day  . Dulaglutide (TRULICITY) 1.5 AC/1.6SA SOPN Inject 0.5 mLs into the skin once a week.  . furosemide (LASIX) 20 MG tablet TAKE 1 TABLET BY MOUTH EVERY DAY  . glipiZIDE (GLUCOTROL XL) 2.5 MG 24 hr tablet Take 1 tablet (2.5 mg total) by mouth daily with breakfast.  . glucose blood (ONETOUCH VERIO) test strip Use to check sugars 1-2 times per day  . JARDIANCE 25 MG TABS tablet TAKE 1 TABLET BY MOUTH EVERY DAY  . Lancets (ONETOUCH ULTRASOFT) lancets Use as instructed  . latanoprost (XALATAN) 0.005 % ophthalmic solution   . levothyroxine (SYNTHROID, LEVOTHROID) 75 MCG tablet Take 1 tablet (75 mcg total) by mouth daily.  [name] losartan (COZAAR) 100 MG tablet TAKE 1 TABLET BY MOUTH EVERY DAY  . metFORMIN (GLUCOPHAGE-XR) 500 MG 24 hr tablet Take 1 tablet (500 mg total) by mouth daily with breakfast.  . metoprolol succinate (TOPROL-XL) 25 MG 24 hr tablet Take 1 tablet (25 mg total)  by mouth daily.  . minoxidil (LONITEN) 10 MG tablet TAKE 1 TABLET IN THE MORNING AND 1/2 TABLET IN THE EVENING  . omeprazole (PRILOSEC) 40 MG capsule TAKE 1 CAPSULE BY MOUTH EVERY DAY  . rosuvastatin (CRESTOR) 10 MG tablet TAKE 1 TABLET BY MOUTH EVERY DAY  . sildenafil (VIAGRA) 100 MG tablet Take 1 tablet (100 mg total) by mouth daily as needed for  erectile dysfunction.  . verapamil (CALAN-SR) 240 MG CR tablet TAKE 1 TABLET BY MOUTH EVERY DAY   No facility-administered encounter medications on file as of 07/22/2018.     Activities of Daily Living In your present state of health, do you have any difficulty performing the following activities: 07/22/2018  Hearing? N  Vision? N  Difficulty concentrating or making decisions? N  Walking or climbing stairs? N  Dressing or bathing? N  Doing errands, shopping? N  Preparing Food and eating ? N  Using the Toilet? N  In the past six months, have you accidently leaked urine? N  Do you have problems with loss of bowel control? N  Managing your Medications? N  Managing your Finances? N  Housekeeping or managing your Housekeeping? N  Some recent data might be hidden    Patient Care Team: Biagio Borg, MD as PCP - General (Internal Medicine)   Assessment:   This is a routine wellness examination for Matthew Keller. Physical assessment deferred to PCP.  Exercise Activities and Dietary recommendations Current Exercise Habits: Structured exercise class;Home exercise routine, Type of exercise: treadmill;strength training/weights(bicycling), Time (Minutes): 40, Frequency (Times/Week): 4, Weekly Exercise (Minutes/Week): 160, Intensity: Mild, Exercise limited by: None identified  Diet (meal preparation, eat out, water intake, caffeinated beverages, dairy products, fruits and vegetables): in general, a "healthy" diet  , well balanced eats a variety of fruits and vegetables daily, limits salt, fat/cholesterol, sugar,carbohydrates,caffeine, drinks 6-8 glasses of  water daily.   Patient states that he has lost approximately 20 pounds. Explaining that he is changing his lifestyle to get his diabetes under control.   Goals   None     Fall Risk Fall Risk  07/29/2017 07/16/2016  Falls in the past year? No No    Depression Screen PHQ 2/9 Scores 07/29/2017 07/16/2016  PHQ - 2 Score 0 0  PHQ- 9 Score 0 -    Cognitive Function       Ad8 score reviewed for issues:  Issues making decisions: no  Less interest in hobbies / activities: no  Repeats questions, stories (family complaining): no  Trouble using ordinary gadgets (microwave, computer, phone):no  Forgets the month or year: no  Mismanaging finances: no  Remembering appts: no  Daily problems with thinking and/or memory: no Ad8 score is= 0  Immunization History  Administered Date(s) Administered  . Influenza Whole 01/06/2009  . Influenza, High Dose Seasonal PF 06/21/2015, 01/06/2017, 12/31/2017  . Influenza-Unspecified 01/06/2014  . Pneumococcal Conjugate-13 11/07/2014  . Pneumococcal Polysaccharide-23 11/17/2013  . Tdap 08/01/2014   Screening Tests Health Maintenance  Topic Date Due  . OPHTHALMOLOGY EXAM  02/20/2017  . FOOT EXAM  07/30/2018  . HEMOGLOBIN A1C  08/26/2018  . INFLUENZA VACCINE  11/07/2018  . PNA vac Low Risk Adult (2 of 2 - PPSV23) 11/18/2018  . TETANUS/TDAP  07/31/2024  . COLONOSCOPY  08/23/2027  . Hepatitis C Screening  Completed       Plan:     I have personally reviewed and noted the following in the patient's chart:   . Medical and social history . Use of alcohol, tobacco or illicit drugs  . Current medications and supplements . Functional ability and status . Nutritional status . Physical activity . Advanced directives . List of other physicians . Vitals . Screenings to include cognitive, depression, and falls . Referrals and appointments  In addition, I have reviewed and discussed with patient certain preventive protocols, quality  metrics, and best practice recommendations. A written personalized  care plan for preventive services as well as general preventive health recommendations were provided to patient.     Michiel Cowboy, RN  07/22/2018  Medical screening examination/treatment/procedure(s) were performed by non-physician practitioner and as supervising physician I was immediately available for consultation/collaboration. I agree with above. Cathlean Cower, MD

## 2018-08-26 ENCOUNTER — Ambulatory Visit (INDEPENDENT_AMBULATORY_CARE_PROVIDER_SITE_OTHER): Payer: Medicare Other | Admitting: Internal Medicine

## 2018-08-26 DIAGNOSIS — Z0000 Encounter for general adult medical examination without abnormal findings: Secondary | ICD-10-CM

## 2018-08-26 DIAGNOSIS — E538 Deficiency of other specified B group vitamins: Secondary | ICD-10-CM

## 2018-08-26 DIAGNOSIS — E611 Iron deficiency: Secondary | ICD-10-CM

## 2018-08-26 DIAGNOSIS — E1165 Type 2 diabetes mellitus with hyperglycemia: Secondary | ICD-10-CM

## 2018-08-26 DIAGNOSIS — E1122 Type 2 diabetes mellitus with diabetic chronic kidney disease: Secondary | ICD-10-CM | POA: Insufficient documentation

## 2018-08-26 DIAGNOSIS — E559 Vitamin D deficiency, unspecified: Secondary | ICD-10-CM

## 2018-08-26 DIAGNOSIS — E1159 Type 2 diabetes mellitus with other circulatory complications: Secondary | ICD-10-CM

## 2018-08-26 DIAGNOSIS — N183 Chronic kidney disease, stage 3 unspecified: Secondary | ICD-10-CM | POA: Insufficient documentation

## 2018-08-26 MED ORDER — ZOSTER VAC RECOMB ADJUVANTED 50 MCG/0.5ML IM SUSR: 0.5000 mL | Freq: Once | INTRAMUSCULAR | 1 refills | Status: AC

## 2018-08-26 NOTE — Progress Notes (Signed)
Patient ID: Matthew Keller, male   DOB: November 02, 1950, 68 y.o.   MRN: [id number]  Virtual Visit via Video Note  I connected with Matthew Keller on 08/26/18 at  9:40 AM EDT by a video enabled telemedicine application and verified that I am speaking with the correct person using two identifiers.  Location: Patient: at home Provider: at office   I discussed the limitations of evaluation and management by telemedicine and the availability of in person appointments. The patient expressed understanding and agreed to proceed.  History of Present Illness: Here for wellness and f/u;  Overall doing ok;  Pt denies Chest pain, worsening SOB, DOE, wheezing, orthopnea, PND, worsening LE edema, palpitations, dizziness or syncope.  Pt denies neurological change such as new headache, facial or extremity weakness.  Pt denies polydipsia, polyuria, or low sugar symptoms. Pt states overall good compliance with treatment and medications, good tolerability, and has been trying to follow appropriate diet.  Pt denies worsening depressive symptoms, suicidal ideation or panic. No fever, night sweats, wt loss, loss of appetite, or other constitutional symptoms.  Pt states good ability with ADL's, has low fall risk, home safety reviewed and adequate, no other significant changes in hearing or vision, and only occasionally active with exercise.   BP at home < 140/90.   Past Medical History:  Diagnosis Date  . Atypical chest pain    L[facility] 08-03-01 nl coronaries and lv fn, lvedp 30.  cardiac workup by Dr. Harrington Challenger 5/09 neg cardiolite for ischemia, rec rsik reduction  . Chicken pox   . Diverticulosis    colonoscopy 9/01........Marland KitchenDr. Deatra Ina  . DM (diabetes mellitus) ([place])   . GERD (gastroesophageal reflux disease)   . Healthcare maintenance    pneumovax 09/2006, Td 04/2004, CPX June 12, 2010  . Hypertension   . Microcytic anemia   . Morbid obesity ([place])    all time high 310 2009.  target wt = 208 for BMI <30.  referred back to nutrition  again June 12, 2010  . 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 Renal insufficiency    baseline 1.6 October 24, 2009 > 1.3 June 12, 2010  . Sleep apnea   . Thyroid disease    Past Surgical History:  Procedure Laterality Date  . KNEE SURGERY      reports that he quit smoking about 33 years ago. His smoking use included cigarettes. He has a 0.30 pack-year smoking history. He has never used smokeless tobacco. He reports current alcohol use. He reports that he does not use drugs. family history includes Alcohol abuse in his brother and father; Diabetes in his mother; Throat cancer in his brother. No Known Allergies Current Outpatient Medications on File Prior to Visit  Medication Sig Dispense Refill  . acetaminophen (TYLENOL) 325 MG tablet Per bottle as needed     . Blood Glucose Monitoring Suppl (ONETOUCH VERIO IQ SYSTEM) w/Device KIT Use to check sugars 1-2 times per day 1 kit 0  . Dulaglutide (TRULICITY) 1.5 QQ/5.9DG SOPN Inject 0.5 mLs into the skin once a week. 6 mL 3  . furosemide (LASIX) 20 MG tablet TAKE 1 TABLET BY MOUTH EVERY DAY 90 tablet 1  . glipiZIDE (GLUCOTROL XL) 2.5 MG 24 hr tablet Take 1 tablet (2.5 mg total) by mouth daily with breakfast. 90 tablet 3  . glucose blood (ONETOUCH VERIO) test strip Use to check sugars 1-2 times per day 100 each  12  . JARDIANCE 25 MG TABS tablet TAKE 1 TABLET BY MOUTH EVERY DAY 90 tablet 1  . Lancets (ONETOUCH ULTRASOFT) lancets Use as instructed 100 each 12  . latanoprost (XALATAN) 0.005 % ophthalmic solution     . levothyroxine (SYNTHROID, LEVOTHROID) 75 MCG tablet Take 1 tablet (75 mcg total) by mouth daily. 30 tablet 0  . losartan (COZAAR) 100 MG tablet TAKE 1 TABLET BY MOUTH EVERY DAY 90 tablet 3  . metFORMIN (GLUCOPHAGE-XR) 500 MG 24 hr tablet Take 1 tablet (500 mg total) by mouth daily with breakfast. 180 tablet 3  . metoprolol succinate (TOPROL-XL) 25 MG 24 hr tablet  Take 1 tablet (25 mg total) by mouth daily. 90 tablet 3  . minoxidil (LONITEN) 10 MG tablet TAKE 1 TABLET IN THE MORNING AND 1/2 TABLET IN THE EVENING 60 tablet 3  . omeprazole (PRILOSEC) 40 MG capsule TAKE 1 CAPSULE BY MOUTH EVERY DAY 90 capsule 1  . rosuvastatin (CRESTOR) 10 MG tablet TAKE 1 TABLET BY MOUTH EVERY DAY 90 tablet 1  . sildenafil (VIAGRA) 100 MG tablet Take 1 tablet (100 mg total) by mouth daily as needed for erectile dysfunction. 9 tablet 0  . verapamil (CALAN-SR) 240 MG CR tablet TAKE 1 TABLET BY MOUTH EVERY DAY 90 tablet 1   No current facility-administered medications on file prior to visit.    Observations/Objective: Alert, NAD, appropriate mood and affect, resps normal, cn 2-12 intact, moves all 4s, no visible rash or swelling Lab Results  Component Value Date   WBC 9.0 07/29/2017   HGB 12.4 (L) 07/29/2017   HCT 36.2 (L) 07/29/2017   PLT 274.0 07/29/2017   GLUCOSE 223 (H) 02/25/2018   CHOL 107 02/25/2018   TRIG 107.0 02/25/2018   HDL 40.30 02/25/2018   LDLCALC 45 02/25/2018   ALT 24 02/25/2018   AST 21 02/25/2018   NA 140 02/25/2018   K 3.8 02/25/2018   CL 101 02/25/2018   CREATININE 1.70 (H) 02/25/2018   BUN 19 02/25/2018   CO2 31 02/25/2018   TSH 2.65 07/29/2017   PSA 0.23 07/29/2017   HGBA1C 7.6 (H) 02/25/2018   MICROALBUR <0.7 07/29/2017   Assessment and Plan: See notes  Follow Up Instructions: See notes   I discussed the assessment and treatment plan with the patient. The patient was provided an opportunity to ask questions and all were answered. The patient agreed with the plan and demonstrated an understanding of the instructions.   The patient was advised to call back or seek an in-person evaluation if the symptoms worsen or if the condition fails to improve as anticipated.  Cathlean Cower, MD

## 2018-08-26 NOTE — Patient Instructions (Signed)
Your shingles shot prescription was sent to your pharmacy  Please continue all other medications as before, and refills have been done if requested.  Please have the pharmacy call with any other refills you may need.  Please continue your efforts at being more active, low cholesterol diet, and weight control.  You are otherwise up to date with prevention measures today.  Please keep your appointments with your specialists as you may have planned  Please go to the LAB in the Basement (turn left off the elevator) for the tests to be done today  You will be contacted by phone if any changes need to be made immediately.  Otherwise, you will receive a letter about your results with an explanation, but please check with MyChart first.  Please remember to sign up for MyChart if you have not done so, as this will be important to you in the future with finding out test results, communicating by private email, and scheduling acute appointments online when needed.  Please return in 6 months, or sooner if needed, with Lab testing done 3-5 days before

## 2018-08-29 ENCOUNTER — Encounter: Payer: Self-pay | Admitting: Internal Medicine

## 2018-08-29 NOTE — Assessment & Plan Note (Signed)
stable overall by history and exam, recent data reviewed with pt, and pt to continue medical treatment as before,  to f/u any worsening symptoms or concerns  

## 2018-08-29 NOTE — Assessment & Plan Note (Signed)

## 2018-08-30 ENCOUNTER — Other Ambulatory Visit: Payer: Self-pay | Admitting: Internal Medicine

## 2018-09-08 ENCOUNTER — Other Ambulatory Visit: Payer: Self-pay | Admitting: Internal Medicine

## 2018-09-09 ENCOUNTER — Other Ambulatory Visit: Payer: Self-pay | Admitting: Internal Medicine

## 2018-09-19 ENCOUNTER — Other Ambulatory Visit: Payer: Self-pay | Admitting: Internal Medicine

## 2018-10-25 ENCOUNTER — Other Ambulatory Visit: Payer: Self-pay | Admitting: Internal Medicine

## 2018-12-03 ENCOUNTER — Other Ambulatory Visit: Payer: Self-pay | Admitting: Internal Medicine

## 2019-03-08 ENCOUNTER — Other Ambulatory Visit: Payer: Self-pay | Admitting: Internal Medicine

## 2019-03-11 ENCOUNTER — Telehealth: Payer: Self-pay

## 2019-03-11 NOTE — Telephone Encounter (Signed)
Called pt, LVM.   

## 2019-03-11 NOTE — Telephone Encounter (Signed)
Please advise. I do not see a recent A1c result or a sleep apnea result any where in the patients chart.    Copied from [facility] [phone]. 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

## 2019-03-11 NOTE — Telephone Encounter (Signed)
Ok to forward the a1c from nov 2019  I dont see any sleep study on the chart

## 2019-04-12 ENCOUNTER — Other Ambulatory Visit: Payer: Self-pay | Admitting: Internal Medicine

## 2019-04-16 ENCOUNTER — Other Ambulatory Visit: Payer: Self-pay | Admitting: Internal Medicine

## 2019-04-16 NOTE — Telephone Encounter (Signed)
Please refill as per office routine med refill policy (all routine meds refilled for 3 mo or monthly per pt preference up to one year from last visit, then month to month grace period for 3 mo, then further med refills will have to be denied)  

## 2019-04-28 ENCOUNTER — Ambulatory Visit: Payer: Medicare PPO | Attending: Internal Medicine

## 2019-04-28 DIAGNOSIS — Z23 Encounter for immunization: Secondary | ICD-10-CM | POA: Insufficient documentation

## 2019-04-28 NOTE — Progress Notes (Signed)
   Covid-19 Vaccination Clinic  Name:  Matthew Keller    MRN: [id number] DOB: May 11, 1950  04/28/2019  Mr. Bowcutt was observed post Covid-19 immunization for 15 minutes without incidence. He was provided with Vaccine Information Sheet and instruction to access the V-Safe system.   Mr. Ellender was instructed to call 911 with any severe reactions post vaccine: [name] Difficulty breathing  . Swelling of your face and throat  . A fast heartbeat  . A bad rash all over your body  . Dizziness and weakness    Immunizations Administered    Name Date Dose VIS Date Route   Pfizer COVID-19 Vaccine 04/28/2019 10:48 AM 0.3 mL 03/19/2019 Intramuscular   Manufacturer: [place]   Lot: [id number]   [place]: [id number]

## 2019-04-29 ENCOUNTER — Ambulatory Visit: Payer: Medicare Other

## 2019-05-17 ENCOUNTER — Ambulatory Visit: Payer: Medicare PPO | Attending: Internal Medicine

## 2019-05-17 DIAGNOSIS — Z23 Encounter for immunization: Secondary | ICD-10-CM | POA: Insufficient documentation

## 2019-05-17 NOTE — Progress Notes (Signed)
   Covid-19 Vaccination Clinic  Name:  Matthew Keller    MRN: [id number] DOB: [birth year]  05/17/2019  [name] was observed post Covid-19 immunization for 15 minutes without incidence. He was provided with Vaccine Information Sheet and instruction to access the V-Safe system.   [name] was instructed to call 911 with any severe reactions post vaccine: [name] Difficulty breathing  . Swelling of your face and throat  . A fast heartbeat  . A bad rash all over your body  . Dizziness and weakness    Immunizations Administered    Name Date Dose VIS Date Route   Pfizer COVID-19 Vaccine 05/17/2019  4:39 PM 0.3 mL 03/19/2019 Intramuscular   Manufacturer: [place]   Lot: [id number]   [place]: [id number]

## 2019-05-20 ENCOUNTER — Telehealth: Payer: Self-pay

## 2019-05-20 MED ORDER — LOSARTAN POTASSIUM 100 MG PO TABS: 100.0000 mg | ORAL_TABLET | Freq: Every day | ORAL | 0 refills | Status: DC

## 2019-05-20 NOTE — Telephone Encounter (Signed)
Per chart no requesrt has been sent for this medication. Inform pt refill has been sent,../lmb

## 2019-05-20 NOTE — Telephone Encounter (Signed)
New message   Patient saying been about 3 weeks pharmacy sent something over   1. Which medications need to be refilled? (please list name of each medication and dose if known) losartan (COZAAR) 100 MG tablet  2. Which pharmacy/location (including street and city if local pharmacy) is medication to be sent to? [facility] on [address]   3. Do they need a 30 day or 90 day supply? 90 days supply

## 2019-05-24 DIAGNOSIS — M25561 Pain in right knee: Secondary | ICD-10-CM | POA: Diagnosis not present

## 2019-05-24 DIAGNOSIS — Z6835 Body mass index (BMI) 35.0-35.9, adult: Secondary | ICD-10-CM | POA: Diagnosis not present

## 2019-07-03 ENCOUNTER — Other Ambulatory Visit: Payer: Self-pay | Admitting: Internal Medicine

## 2019-07-03 NOTE — Telephone Encounter (Signed)
Please refill as per office routine med refill policy (all routine meds refilled for 3 mo or monthly per pt preference up to one year from last visit, then month to month grace period for 3 mo, then further med refills will have to be denied)  

## 2019-07-05 ENCOUNTER — Other Ambulatory Visit: Payer: Self-pay | Admitting: Internal Medicine

## 2019-07-07 ENCOUNTER — Other Ambulatory Visit: Payer: Self-pay | Admitting: Internal Medicine

## 2019-07-07 DIAGNOSIS — M25561 Pain in right knee: Secondary | ICD-10-CM | POA: Diagnosis not present

## 2019-07-14 ENCOUNTER — Other Ambulatory Visit: Payer: Self-pay | Admitting: Orthopaedic Surgery

## 2019-07-19 NOTE — Progress Notes (Signed)
Cardiology Office Note:    Date:  07/20/2019   ID:  Matthew Keller, DOB [birth year], MRN [id number]  PCP:  Biagio Borg, MD  Cardiologist:  No primary care provider on file.   Referring MD: Biagio Borg, MD   Chief Complaint  Patient presents with  . Pre-op Exam    History of Present Illness:    Matthew Keller is a 69 y.o. male with a hx of morbid obesity, OSA, DM2, severe hypertension, and dyslipidemia. He is a former athlete but has struggled to stacy active in the setting of obesity. He had a heart cath 2003 that showed nonobstructive disease with 30% stenosis in the mid-LAD and 20% stenosis in the mid-RCA.  Prior to returning to physical exercise, he underwent a nuclear stress test 08/2016 that showed a small area of mild ischemia in the inferior wall. In the absence of symptoms and normal EF, no further workup was planned, but needs aggressive secondary prevention.  He was able to exercise without angina.   He returns today for preop clearance for knee replacement surgery. He is doing well with physical activity despite knee pain. He is going to the gym routinely and can complete weight training and 1 hr on the stationary bike. He also golfs regularly. No anginal symptoms. His pressure is elevated today. He is compliant on medication. He will obtain routine labs with his PCP next week, including lipid profile and BMP.   Past Medical History:  Diagnosis Date  . Atypical chest pain    [facility] 08-03-01 nl coronaries and lv fn, lvedp 30.  cardiac workup by Dr. Harrington Challenger 5/09 neg cardiolite for ischemia, rec rsik reduction  . Chicken pox   . Diverticulosis    colonoscopy 9/01........Marland KitchenDr. Deatra Ina  . DM (diabetes mellitus) ([place])   . GERD (gastroesophageal reflux disease)   . Healthcare maintenance    pneumovax 09/2006, Td 04/2004, CPX June 12, 2010  . Hypertension   . Microcytic anemia   . Morbid obesity ([place])    all time high 310 2009.  target wt = 208 for BMI <30.  referred back to nutrition  again June 12, 2010  . 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 Renal insufficiency    baseline 1.6 October 24, 2009 > 1.3 June 12, 2010  . Sleep apnea   . Thyroid disease     Past Surgical History:  Procedure Laterality Date  . KNEE SURGERY      Current Medications: Current Meds  Medication Sig  . acetaminophen (TYLENOL) 325 MG tablet Per bottle as needed   . Blood Glucose Monitoring Suppl (ONETOUCH VERIO IQ SYSTEM) w/Device KIT Use to check sugars 1-2 times per day  . Dulaglutide (TRULICITY) 1.5 DG/3.8VF SOPN Inject 1 pen into the skin once a week.  . empagliflozin (JARDIANCE) 25 MG TABS tablet Take 25 mg by mouth daily. Annual appt due in May must see provider for future refills  . furosemide (LASIX) 20 MG tablet Take 1 tablet (20 mg total) by mouth daily. Annual appt due in MAY must see provider for future refills  . glipiZIDE (GLUCOTROL XL) 2.5 MG 24 hr tablet TAKE 1 TABLET (2.5 MG TOTAL) BY MOUTH DAILY WITH BREAKFAST.  [name] glucose blood (ONETOUCH VERIO) test strip Use to check sugars 1-2 times per day  . Lancets (ONETOUCH ULTRASOFT) lancets Use as instructed  . latanoprost (XALATAN) 0.005 % ophthalmic solution   .  levothyroxine (SYNTHROID) 75 MCG tablet TAKE 1 TABLET BY MOUTH EVERY DAY  . losartan (COZAAR) 100 MG tablet Take 1 tablet (100 mg total) by mouth daily. Annual appt due in May must see provider for future refills  . metFORMIN (GLUCOPHAGE-XR) 500 MG 24 hr tablet TAKE 1 TABLET BY MOUTH EVERY DAY WITH BREAKFAST  . metoprolol succinate (TOPROL-XL) 25 MG 24 hr tablet TAKE 1 TABLET BY MOUTH EVERY DAY  . minoxidil (LONITEN) 10 MG tablet Take 1 tablet in the morning and 1/2 tablet in the evening. Annual appt due in MAY must see provider for future refills  . omeprazole (PRILOSEC) 40 MG capsule TAKE 1 CAPSULE BY MOUTH EVERY DAY  . rosuvastatin (CRESTOR) 10 MG tablet Take 1 tablet (10 mg total) by mouth daily.  Annual appt due in MAY must see provider for future refills  . sildenafil (VIAGRA) 100 MG tablet Take 1 tablet (100 mg total) by mouth daily as needed for erectile dysfunction.  . verapamil (CALAN-SR) 240 MG CR tablet Take 1 tablet (240 mg total) by mouth daily. Annual appt due in MAY must see provider for future refills  . [DISCONTINUED] verapamil (CALAN-SR) 240 MG CR tablet Take 1 tablet (240 mg total) by mouth daily. Annual appt due in MAY must see provider for future refills     Allergies:   Patient has no known allergies.   Social History   Socioeconomic History  . Marital status: Married    Spouse name: Not on file  . Number of children: 2  . Years of education: 31  . Highest education level: Not on file  Occupational History  . Occupation: Recruitment consultant for [employer]: [place]  Tobacco Use  . Smoking status: Former Smoker    Packs/day: 0.30    Years: 1.00    Pack years: 0.30    Types: Cigarettes    Quit date: 04/08/1985    Years since quitting: 34.3  . Smokeless tobacco: Never Used  Substance and Sexual Activity  . Alcohol use: Yes    Comment: occasional  . Drug use: No  . Sexual activity: Yes  Other Topics Concern  . 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    Social Determinants of Health   Financial Resource Strain: Low Risk   . Difficulty of Paying Living Expenses: Not hard at all  Food Insecurity: No Food Insecurity  . Worried About Charity fundraiser in the Last Year: Never true  . Ran Out of Food in the Last Year: Never true  Transportation Needs: No Transportation Needs  . Lack of Transportation (Medical): No  . Lack of Transportation (Non-Medical): No  Physical Activity: Sufficiently Active  . Days of Exercise per Week: 4 days  . Minutes of Exercise per Session: 40  min  Stress: No Stress Concern Present  . Feeling of Stress : Not at all  Social Connections: Not Isolated  . Frequency of Communication with Friends and Family: More than three times a week  . Frequency of Social Gatherings with Friends and Family: More than three times a week  . Attends Religious Services: More than 4 times per year  . Active Member of Clubs or Organizations: Yes  . Attends Archivist Meetings: More than 4 times  per year  . Marital Status: Married     Family History: The patient's family history includes Alcohol abuse in his brother and father; Diabetes in his mother; Throat cancer in his brother. There is no history of Colon cancer, Esophageal cancer, Liver cancer, Pancreatic cancer, Rectal cancer, or Stomach cancer.  ROS:   Please see the history of present illness.     All other systems reviewed and are negative.  EKGs/Labs/Other Studies Reviewed:    The following studies were reviewed today:  Nuclear stress test 08/2016:  Nuclear stress EF: 53%.  The left ventricular ejection fraction is mildly decreased (45-54%).  Defect 1: There is a small defect of mild severity present in the mid inferior and apical inferior location.  Findings consistent with ischemia.  This is a low risk study.   There is a small area, mild severity reversible defect in the mid and apical inferior walls consistent with mild ischemia (SDS = 2).   EKG:  EKG is  ordered today.  The ekg ordered today demonstrates sinus rhythm HR 63, LVH with repolarization abnormalities - unchanged from prior tracing  Recent Labs: No results found for requested labs within last 8760 hours.  Recent Lipid Panel    Component Value Date/Time   CHOL 107 02/25/2018 1154   CHOL 125 01/02/2017 1005   TRIG 107.0 02/25/2018 1154   HDL 40.30 02/25/2018 1154   HDL 48 01/02/2017 1005   CHOLHDL 3 02/25/2018 1154   VLDL 21.4 02/25/2018 1154   LDLCALC 45 02/25/2018 1154   LDLCALC 66 01/02/2017  1005    Physical Exam:    VS:  BP (!) 152/84   Pulse 67   Ht _0  (1.803 m)   Wt 279 lb 9.6 oz (126.8 kg)   SpO2 98%   BMI 39.00 kg/m     Wt Readings from Last 3 Encounters:  07/20/19 279 lb 9.6 oz (126.8 kg)  02/25/18 282 lb (127.9 kg)  08/22/17 275 lb (124.7 kg)     GEN: obese male in no acute distress HEENT: Normal NECK: No JVD; No carotid bruits LYMPHATICS: No lymphadenopathy CARDIAC: RRR, no murmurs, rubs, gallops RESPIRATORY:  Clear to auscultation without rales, wheezing or rhonchi  ABDOMEN: Soft, non-tender, non-distended MUSCULOSKELETAL:  No edema; No deformity  SKIN: Warm and dry NEUROLOGIC:  Alert and oriented x 3 PSYCHIATRIC:  Normal affect   ASSESSMENT:    1. Preoperative clearance   2. Essential hypertension   3. Coronary artery disease involving native coronary artery of native heart without angina pectoris   4. HYPERCHOLESTEROLEMIA   5. Type 2 diabetes mellitus without complication, without long-term current use of insulin (HCC)   6. Stage 3 chronic kidney disease, unspecified whether stage 3a or 3b CKD    PLAN:    In order of problems listed above:  Preoperative clearance for TKA He has a history of mild nonobstructive CAD. He is diabetic, but does not take insulin. He has CKD but no history of sCr above 2.0. He is able to complete more than 4.0 METS without anginal symptoms.  According to the RCRI, he is at acceptable risk for the planned procedure. Given his disease and DM, he knows he is at risk for complications and wishes to proceed with surgery. No further cardiac workup indicated prior to surgery.  I will fax my note to [facility] [phone]   Nonobstructive CAD by heart cath 2003 - abnormal nuclear stress test 2018 - he is exercising routinely without anginal  symptoms - no indication for ischemic workup prior to surgery - I would recommend starting 81 mg ASA after he recovers from knee surgery   Hyperlipidemia - needs  updated lipid profile - 2019 LDL was 45 - continue crestor 10 mg - will get lipid profile with his PCP next week    Hypertension - continue losartan, minoxidil, verapamil, lasix, and BB - BP at home 134/80s - will defer medication changes for now   DM - A1c 7.6% in 2019 - not on insulin   CKD stage III - labs followed by PCP  Follow up with Dr. Sallyanne Kuster in 1 year.    Medication Adjustments/Labs and Tests Ordered: Current medicines are reviewed at length with the patient today.  Concerns regarding medicines are outlined above.  Orders Placed This Encounter  Procedures  . EKG 12-Lead   Meds ordered this encounter  Medications  . verapamil (CALAN-SR) 240 MG CR tablet    Sig: Take 1 tablet (240 mg total) by mouth daily. Annual appt due in MAY must see provider for future refills    Dispense:  90 tablet    Refill:  3    Signed, Ledora Bottcher, [place]  07/20/2019 3:54 PM     [facility]

## 2019-07-20 ENCOUNTER — Ambulatory Visit: Payer: Medicare PPO | Admitting: Physician Assistant

## 2019-07-20 ENCOUNTER — Other Ambulatory Visit: Payer: Self-pay

## 2019-07-20 ENCOUNTER — Encounter: Payer: Self-pay | Admitting: Physician Assistant

## 2019-07-20 VITALS — BP 152/84 | HR 67 | Ht 71.0 in | Wt 279.6 lb

## 2019-07-20 DIAGNOSIS — I10 Essential (primary) hypertension: Secondary | ICD-10-CM | POA: Diagnosis not present

## 2019-07-20 DIAGNOSIS — I2510 Atherosclerotic heart disease of native coronary artery without angina pectoris: Secondary | ICD-10-CM | POA: Diagnosis not present

## 2019-07-20 DIAGNOSIS — E119 Type 2 diabetes mellitus without complications: Secondary | ICD-10-CM | POA: Diagnosis not present

## 2019-07-20 DIAGNOSIS — N183 Chronic kidney disease, stage 3 unspecified: Secondary | ICD-10-CM | POA: Diagnosis not present

## 2019-07-20 DIAGNOSIS — Z01818 Encounter for other preprocedural examination: Secondary | ICD-10-CM | POA: Diagnosis not present

## 2019-07-20 DIAGNOSIS — E7800 Pure hypercholesterolemia, unspecified: Secondary | ICD-10-CM | POA: Diagnosis not present

## 2019-07-20 MED ORDER — VERAPAMIL HCL ER 240 MG PO TBCR: 240.0000 mg | EXTENDED_RELEASE_TABLET | Freq: Every day | ORAL | 3 refills | Status: DC

## 2019-07-20 NOTE — Patient Instructions (Signed)
Medication Instructions:  Your physician recommends that you continue on your current medications as directed. Please refer to the Current Medication list given to you today.  *If you need a refill on your cardiac medications before your next appointment, please call your pharmacy*    Follow-Up: At [facility], you and your health needs are our priority.  As part of our continuing mission to provide you with exceptional heart care, we have created designated Provider Care Teams.  These Care Teams include your primary Cardiologist (physician) and Advanced Practice Providers (APPs -  Physician Assistants and Nurse Practitioners) who all work together to provide you with the care you need, when you need it.  We recommend signing up for the patient portal called "MyChart".  Sign up information is provided on this After Visit Summary.  MyChart is used to connect with patients for Virtual Visits (Telemedicine).  Patients are able to view lab/test results, encounter notes, upcoming appointments, etc.  Non-urgent messages can be sent to your provider as well.   To learn more about what you can do with MyChart, go to [email or web address].    Your next appointment:   12 month(s)  The format for your next appointment:   In Person  Provider:   You may see Sanda Klein, MD or one of the following Advanced Practice Providers on your designated Care Team:    Almyra Deforest, PA-C  Fabian Sharp, [place] or   Roby Lofts, [place]    Other Instructions Your physician has requested that you regularly monitor and record your blood pressure readings at home, 2 hours after taking your morning blood pressure medications, 5 minutes after sitting down. Please use the same machine at the same time of day to check your readings and record them to bring to your follow-up visit.  Please call our office 2 months in advance to schedule your one year follow-up appointment with Dr. Sallyanne Kuster.

## 2019-07-20 NOTE — Progress Notes (Signed)
Thank you :)

## 2019-07-26 ENCOUNTER — Other Ambulatory Visit: Payer: Self-pay | Admitting: Internal Medicine

## 2019-07-27 ENCOUNTER — Ambulatory Visit (INDEPENDENT_AMBULATORY_CARE_PROVIDER_SITE_OTHER): Payer: Medicare PPO

## 2019-07-27 ENCOUNTER — Other Ambulatory Visit: Payer: Self-pay

## 2019-07-27 VITALS — BP 160/80 | HR 75 | Temp 98.4°F | Resp 18 | Ht 71.0 in | Wt 282.4 lb

## 2019-07-27 DIAGNOSIS — Z0000 Encounter for general adult medical examination without abnormal findings: Secondary | ICD-10-CM

## 2019-07-27 NOTE — Progress Notes (Signed)
Subjective:   Matthew Keller is a 69 y.o. male who presents for Medicare Annual/Subsequent preventive examination.  Review of Systems:  No Review of Systems Medicare Wellness Visit Cardiac Risk Factors include: advanced age (>74mn, >>61women);diabetes mellitus;dyslipidemia;hypertension;male gender;obesity (BMI >30kg/m2)  Sleep Patterns: No issues with falling sleep; sleeps 6-7 hours and awake 1-2 times to void. Home Safety/Smoke Alarms: Feels safe in home; Smoke alarms in place. Living environment: [place] good family support system. Seat Belt Safety/Bike Helmet: Wears seat belt.    Objective:    Vitals: BP (!) 160/80 (BP Location: Left Arm, Cuff Size: Normal)   Pulse 75   Temp 98.4 F (36.9 C)   Resp 18   Ht 5' 11" (1.803 m)   Wt 282 lb 6.4 oz (128.1 kg)   SpO2 98%   BMI 39.39 kg/m   Body mass index is 39.39 kg/m.  Advanced Directives 07/27/2019 07/22/2018 08/22/2017 08/01/2014 08/01/2014  Does Patient Have a Medical Advance Directive? No Yes No Yes No  Type of Advance Directive - [place] - [place] -  Copy of [place]? - No - copy requested - - -  Would patient like information on creating a medical advance directive? Yes (ED - Information included in AVS) - - - -    Tobacco Social History   Tobacco Use  Smoking Status Former Smoker  . Packs/day: 0.30  . Years: 1.00  . Pack years: 0.30  . Types: Cigarettes  . Quit date: 04/08/1985  . Years since quitting: 34.3  Smokeless Tobacco Never Used     Counseling given: No   Clinical Intake:  Pre-visit preparation completed: Yes  Pain : No/denies pain Pain Score: 0-No pain     BMI - recorded: 39.4 Nutritional Status: BMI > 30  Obese Nutritional Risks: None Diabetes: Yes CBG done?: No Did pt. bring in CBG monitor from home?: No  How often do you need to have someone help you when you read  instructions, pamphlets, or other written materials from your doctor or pharmacy?: 1 - Never What is the last grade level you completed in school?: Master's Degree from [employer] S[employer] FinancialNeeded?: No  Information entered by :: [name]  Past Medical History:  Diagnosis Date  . Atypical chest pain    [facility] 08-03-01 nl coronaries and lv fn, lvedp 30.  cardiac workup by Dr. RHarrington Challenger5/09 neg cardiolite for ischemia, rec rsik reduction  . Chicken pox   . Diverticulosis    colonoscopy 9/01.........Marland Kitchenr. KDeatra Ina . DM (diabetes mellitus) ([place]   . GERD (gastroesophageal reflux disease)   . Healthcare maintenance    pneumovax 09/2006, Td 04/2004, CPX June 12, 2010  . Hypertension   . Microcytic anemia   . Morbid obesity ([place]    all time high 310 2009.  target wt = 208 for BMI <30.  referred back to nutrition again June 12, 2010  . OSA (obstructive sleep apnea)    on CPAP, sleep study 09/2007.............Marland Kitchenr. 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 Renal insufficiency    baseline 1.6 October 24, 2009 > 1.3 June 12, 2010  . Sleep apnea   . Thyroid disease    Past Surgical History:  Procedure Laterality Date  . KNEE SURGERY     Family History  Problem Relation Age of Onset  . Diabetes Mother   . Alcohol  abuse Father   . Throat cancer Brother        smoker  . Alcohol abuse Brother   . Colon cancer Neg Hx   . Esophageal cancer Neg Hx   . Liver cancer Neg Hx   . Pancreatic cancer Neg Hx   . Rectal cancer Neg Hx   . Stomach cancer Neg Hx    Social History   Socioeconomic History  . Marital status: Married    Spouse name: Not on file  . Number of children: 2  . Years of education: 28  . Highest education level: Not on file  Occupational History  . Occupation: Recruitment consultant for [employer]: [place]  Tobacco Use  . Smoking status: Former Smoker    Packs/day: 0.30    Years: 1.00    Pack years: 0.30    Types: Cigarettes    Quit date:  04/08/1985    Years since quitting: 34.3  . Smokeless tobacco: Never Used  Substance and Sexual Activity  . Alcohol use: Yes    Comment: occasional  . Drug use: No  . Sexual activity: Yes  Other Topics Concern  . 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    Social Determinants of Health   Financial Resource Strain:   . Difficulty of Paying Living Expenses:   Food Insecurity:   . Worried About Charity fundraiser in the Last Year:   . Arboriculturist in the Last Year:   Transportation Needs:   . Film/video editor (Medical):   [name] Lack of Transportation (Non-Medical):   Physical Activity:   . Days of Exercise per Week:   . Minutes of Exercise per Session:   Stress:   . Feeling of Stress :   Social Connections:   . Frequency of Communication with Friends and Family:   . Frequency of Social Gatherings with Friends and Family:   . Attends Religious Services:   . Active Member of Clubs or Organizations:   . Attends Archivist Meetings:   [name] Marital Status:     Outpatient Encounter Medications as of 07/27/2019  Medication Sig  . acetaminophen (TYLENOL) 325 MG tablet Take 650 mg by mouth every 6 (six) hours as needed for moderate pain.   . Blood Glucose Monitoring Suppl (ONETOUCH VERIO IQ SYSTEM) w/Device KIT Use to check sugars 1-2 times per day  . Dulaglutide (TRULICITY) 1.5 RJ/1.8AC SOPN Inject 1 pen into the skin once a week. (Patient taking differently: Inject 1.5 mg into the skin once a week. )  . empagliflozin (JARDIANCE) 25 MG TABS tablet Take 25 mg by mouth daily. Annual appt due in May must see provider for future refills (Patient taking differently: Take 25 mg by mouth daily. )  . furosemide (LASIX) 20 MG tablet Take 1 tablet (20 mg total) by mouth daily. Annual appt due in MAY must see provider for  future refills (Patient taking differently: Take 20 mg by mouth daily. )  . glipiZIDE (GLUCOTROL XL) 2.5 MG 24 hr tablet TAKE 1 TABLET (2.5 MG TOTAL) BY MOUTH DAILY WITH BREAKFAST.  [name] glucose blood (ONETOUCH VERIO) test strip Use to check sugars 1-2 times per day  . Lancets (ONETOUCH ULTRASOFT) lancets Use as instructed  . latanoprost (  XALATAN) 0.005 % ophthalmic solution Place 1 drop into both eyes at bedtime.   [name] levothyroxine (SYNTHROID) 75 MCG tablet TAKE 1 TABLET BY MOUTH EVERY DAY (Patient taking differently: Take 75 mcg by mouth daily before breakfast. )  . losartan (COZAAR) 100 MG tablet Take 1 tablet (100 mg total) by mouth daily. Annual appt due in May must see provider for future refills (Patient taking differently: Take 100 mg by mouth daily. )  . metFORMIN (GLUCOPHAGE-XR) 500 MG 24 hr tablet TAKE 1 TABLET BY MOUTH EVERY DAY WITH BREAKFAST (Patient taking differently: Take 500 mg by mouth daily with breakfast. )  . metoprolol succinate (TOPROL-XL) 25 MG 24 hr tablet TAKE 1 TABLET BY MOUTH EVERY DAY (Patient taking differently: Take 25 mg by mouth daily. )  . minoxidil (LONITEN) 10 MG tablet Take 1 tablet in the morning and 1/2 tablet in the evening. Annual appt due in MAY must see provider for future refills (Patient taking differently: Take 5-10 mg by mouth See admin instructions. Take 10 mg by mouth in the morning and 5 mg in the evening)  . omeprazole (PRILOSEC) 40 MG capsule Take 1 capsule (40 mg total) by mouth daily. Annual appt due in MAY must see provider for future refills  . rosuvastatin (CRESTOR) 10 MG tablet Take 1 tablet (10 mg total) by mouth daily. Annual appt due in MAY must see provider for future refills (Patient taking differently: Take 10 mg by mouth daily. )  . sildenafil (VIAGRA) 100 MG tablet Take 1 tablet (100 mg total) by mouth daily as needed for erectile dysfunction.  . verapamil (CALAN-SR) 240 MG CR tablet Take 1 tablet (240 mg total) by mouth daily. Annual appt  due in MAY must see provider for future refills   No facility-administered encounter medications on file as of 07/27/2019.    Activities of Daily Living In your present state of health, do you have any difficulty performing the following activities: 07/27/2019  Hearing? N  Vision? N  Difficulty concentrating or making decisions? N  Walking or climbing stairs? N  Dressing or bathing? N  Doing errands, shopping? N  Preparing Food and eating ? N  Using the Toilet? N  In the past six months, have you accidently leaked urine? N  Do you have problems with loss of bowel control? N  Managing your Medications? N  Managing your Finances? N  Housekeeping or managing your Housekeeping? N  Some recent data might be hidden    Patient Care Team: Biagio Borg, MD as PCP - General (Internal Medicine)   Assessment:   This is a routine wellness examination for Matthew Keller.  Exercise Activities and Dietary recommendations Current Exercise Habits: Home exercise routine, Type of exercise: walking;Other - see comments(yard work; goes to [employer]), Time (Minutes): 45, Frequency (Times/Week): 4, Weekly Exercise (Minutes/Week): 180, Intensity: Moderate, Exercise limited by: orthopedic condition(s)  Goals    . Patient Stated     Continue to lose weight by eating healthy and by exercising routinely.     . Patient Stated     To lose at least 50 pounds and continue to stay active with exercise, playing golf or working in the yard.       Fall Risk Fall Risk  07/27/2019 08/26/2018 07/22/2018 07/29/2017 07/16/2016  Falls in the past year? 0 0 0 No No  Number falls in past yr: 0 - 0 - -  Injury with Fall? 0 - - - -  Risk for fall  due to : No Fall Risks - - - -  Follow up Falls evaluation completed;Education provided;Falls prevention discussed - - - -   Is the patient's home free of loose throw rugs in walkways, pet beds, electrical cords, etc?   yes      Grab bars in the bathroom? no      Handrails on the  stairs?   yes      Adequate lighting?   yes   Depression Screen PHQ 2/9 Scores 07/27/2019 08/26/2018 07/22/2018 07/29/2017  PHQ - 2 Score 0 0 0 0  PHQ- 9 Score - - - 0          Immunization History  Administered Date(s) Administered  . Influenza Whole 01/06/2009  . Influenza, High Dose Seasonal PF 06/21/2015, 01/06/2017, 12/31/2017  . Influenza-Unspecified 01/06/2014  . PFIZER SARS-COV-2 Vaccination 04/28/2019, 05/17/2019  . Pneumococcal Conjugate-13 11/07/2014  . Pneumococcal Polysaccharide-23 11/17/2013  . Tdap 08/01/2014    Qualifies for Shingles Vaccine? Yes  Screening Tests Health Maintenance  Topic Date Due  . HEMOGLOBIN A1C  08/26/2018  . PNA vac Low Risk Adult (2 of 2 - PPSV23) 11/18/2018  . OPHTHALMOLOGY EXAM  08/17/2019  . FOOT EXAM  08/26/2019  . INFLUENZA VACCINE  11/07/2019  . TETANUS/TDAP  07/31/2024  . COLONOSCOPY  08/23/2027  . COVID-19 Vaccine  Completed  . Hepatitis C Screening  Completed   Cancer Screenings: Lung: Low Dose CT Chest recommended if Age 20-80 years, 30 pack-year currently smoking OR have quit w/in 15years. Patient does not qualify. Colorectal: Yes      Plan:    Reviewed health maintenance screenings with patient today and relevant education, vaccines, and/or referrals were provided.    Continue doing brain stimulating activities (puzzles, reading, adult coloring books, staying active) to keep memory sharp.    Continue to eat heart healthy diet (full of fruits, vegetables, whole grains, lean protein, water--limit salt, fat, and sugar intake) and increase physical activity as tolerated.  I have personally reviewed and noted the following in the patient's chart:   . Medical and social history . Use of alcohol, tobacco or illicit drugs  . Current medications and supplements . Functional ability and status . Nutritional status . Physical activity . Advanced directives . List of other physicians . Hospitalizations, surgeries, and ER  visits in previous 12 months . Vitals . Screenings to include cognitive, depression, and falls . Referrals and appointments  In addition, I have reviewed and discussed with patient certain preventive protocols, quality metrics, and best practice recommendations. A written personalized care plan for preventive services as well as general preventive health recommendations were provided to patient.     Sheral Flow, LPN  06/29/4686  Nurse Health Advisor

## 2019-07-27 NOTE — Patient Instructions (Signed)
Matthew Keller , Thank you for taking time to come for your Medicare Wellness Visit. I appreciate your ongoing commitment to your health goals. Please review the following plan we discussed and let me know if I can assist you in the future.   Screening recommendations/referrals: Colorectal Screening: 08/22/2017  Vision and Dental Exams: Recommended annual ophthalmology exams for early detection of glaucoma and other disorders of the eye Recommended annual dental exams for proper oral hygiene  Diabetic Exams: Diabetic Eye Exam: 08/17/2018 Diabetic Foot Exam: 08/26/2018  Vaccinations: Influenza vaccine: 12/31/2017; overdue Pneumococcal vaccine: Pneumovax 11/17/2013 and Prevnar 11/07/2014 Tdap vaccine: 08/01/2014; Due every 10 years. Shingles vaccine: Please call your insurance company to determine your out of pocket expense for the Shingrix vaccine. You may receive this vaccine at your local pharmacy. Covid vaccine: Pfizer 04/28/2019 and 05/17/2019; completed.  Advanced directives: We have received a copy of your POA (Power of [place]) and/or Living Will. These documents can be located in your chart.  Goals:  Recommend to drink at least 6-8 8oz glasses of water per day.  Recommend to exercise for at least 150 minutes per week.  Recommend to remove any items from the home that may cause slips or trips.  Recommend to decrease portion sizes by eating 3 small healthy meals and at least 2 healthy snacks per day.  Recommend to begin DASH diet as directed below  Recommend to continue efforts to reduce smoking habits until no longer smoking. Smoking Cessation literature is attached below.  Next appointment: Please schedule your Annual Wellness Visit with your Nurse Health Advisor in one year.  Preventive Care 40 Years and Older, Male Preventive care refers to lifestyle choices and visits with your health care provider that can promote health and wellness. What does preventive care include?  A yearly  physical exam. This is also called an annual well check.  Dental exams once or twice a year.  Routine eye exams. Ask your health care provider how often you should have your eyes checked.  Personal lifestyle choices, including:  Daily care of your teeth and gums.  Regular physical activity.  Eating a healthy diet.  Avoiding tobacco and drug use.  Limiting alcohol use.  Practicing safe sex.  Taking low doses of aspirin every day if recommended by your health care provider..  Taking vitamin and mineral supplements as recommended by your health care provider. What happens during an annual well check? The services and screenings done by your health care provider during your annual well check will depend on your age, overall health, lifestyle risk factors, and family history of disease. Counseling  Your health care provider may ask you questions about your:  Alcohol use.  Tobacco use.  Drug use.  Emotional well-being.  Home and relationship well-being.  Sexual activity.  Eating habits.  History of falls.  Memory and ability to understand (cognition).  Work and work Statistician. Screening  You may have the following tests or measurements:  Height, weight, and BMI.  Blood pressure.  Lipid and cholesterol levels. These may be checked every 5 years, or more frequently if you are over 71 years old.  Skin check.  Lung cancer screening. You may have this screening every year starting at age 46 if you have a 30-pack-year history of smoking and currently smoke or have quit within the past 15 years.  Fecal occult blood test (FOBT) of the stool. You may have this test every year starting at age 32.  Flexible sigmoidoscopy or colonoscopy. You  may have a sigmoidoscopy every 5 years or a colonoscopy every 10 years starting at age 4.  Prostate cancer screening. Recommendations will vary depending on your family history and other risks.  Hepatitis C blood  test.  Hepatitis B blood test.  Sexually transmitted disease (STD) testing.  Diabetes screening. This is done by checking your blood sugar (glucose) after you have not eaten for a while (fasting). You may have this done every 1-3 years.  Abdominal aortic aneurysm (AAA) screening. You may need this if you are a current or former smoker.  Osteoporosis. You may be screened starting at age 37 if you are at high risk. Talk with your health care provider about your test results, treatment options, and if necessary, the need for more tests. Vaccines  Your health care provider may recommend certain vaccines, such as:  Influenza vaccine. This is recommended every year.  Tetanus, diphtheria, and acellular pertussis (Tdap, Td) vaccine. You may need a Td booster every 10 years.  Zoster vaccine. You may need this after age 75.  Pneumococcal 13-valent conjugate (PCV13) vaccine. One dose is recommended after age 68.  Pneumococcal polysaccharide (PPSV23) vaccine. One dose is recommended after age 41. Talk to your health care provider about which screenings and vaccines you need and how often you need them. This information is not intended to replace advice given to you by your health care provider. Make sure you discuss any questions you have with your health care provider. Document Released: 04/21/2015 Document Revised: 12/13/2015 Document Reviewed: 01/24/2015 Elsevier Interactive Patient Education  2017 [place] Prevention in the Home Falls can cause injuries. They can happen to people of all ages. There are many things you can do to make your home safe and to help prevent falls. What can I do on the outside of my home?  Regularly fix the edges of walkways and driveways and fix any cracks.  Remove anything that might make you trip as you walk through a door, such as a raised step or threshold.  Trim any bushes or trees on the path to your home.  Use bright outdoor  lighting.  Clear any walking paths of anything that might make someone trip, such as rocks or tools.  Regularly check to see if handrails are loose or broken. Make sure that both sides of any steps have handrails.  Any raised decks and porches should have guardrails on the edges.  Have any leaves, snow, or ice cleared regularly.  Use sand or salt on walking paths during winter.  Clean up any spills in your garage right away. This includes oil or grease spills. What can I do in the bathroom?  Use night lights.  Install grab bars by the toilet and in the tub and shower. Do not use towel bars as grab bars.  Use non-skid mats or decals in the tub or shower.  If you need to sit down in the shower, use a plastic, non-slip stool.  Keep the floor dry. Clean up any water that spills on the floor as soon as it happens.  Remove soap buildup in the tub or shower regularly.  Attach bath mats securely with double-sided non-slip rug tape.  Do not have throw rugs and other things on the floor that can make you trip. What can I do in the bedroom?  Use night lights.  Make sure that you have a light by your bed that is easy to reach.  Do not use any sheets or  blankets that are too big for your bed. They should not hang down onto the floor.  Have a firm chair that has side arms. You can use this for support while you get dressed.  Do not have throw rugs and other things on the floor that can make you trip. What can I do in the kitchen?  Clean up any spills right away.  Avoid walking on wet floors.  Keep items that you use a lot in easy-to-reach places.  If you need to reach something above you, use a strong step stool that has a grab bar.  Keep electrical cords out of the way.  Do not use floor polish or wax that makes floors slippery. If you must use wax, use non-skid floor wax.  Do not have throw rugs and other things on the floor that can make you trip. What can I do with my  stairs?  Do not leave any items on the stairs.  Make sure that there are handrails on both sides of the stairs and use them. Fix handrails that are broken or loose. Make sure that handrails are as long as the stairways.  Check any carpeting to make sure that it is firmly attached to the stairs. Fix any carpet that is loose or worn.  Avoid having throw rugs at the top or bottom of the stairs. If you do have throw rugs, attach them to the floor with carpet tape.  Make sure that you have a light switch at the top of the stairs and the bottom of the stairs. If you do not have them, ask someone to add them for you. What else can I do to help prevent falls?  Wear shoes that:  Do not have high heels.  Have rubber bottoms.  Are comfortable and fit you well.  Are closed at the toe. Do not wear sandals.  If you use a stepladder:  Make sure that it is fully opened. Do not climb a closed stepladder.  Make sure that both sides of the stepladder are locked into place.  Ask someone to hold it for you, if possible.  Clearly mark and make sure that you can see:  Any grab bars or handrails.  First and last steps.  Where the edge of each step is.  Use tools that help you move around (mobility aids) if they are needed. These include:  Canes.  Walkers.  Scooters.  Crutches.  Turn on the lights when you go into a dark area. Replace any light bulbs as soon as they burn out.  Set up your furniture so you have a clear path. Avoid moving your furniture around.  If any of your floors are uneven, fix them.  If there are any pets around you, be aware of where they are.  Review your medicines with your doctor. Some medicines can make you feel dizzy. This can increase your chance of falling. Ask your doctor what other things that you can do to help prevent falls. This information is not intended to replace advice given to you by your health care provider. Make sure you discuss any  questions you have with your health care provider. Document Released: 01/19/2009 Document Revised: 08/31/2015 Document Reviewed: 04/29/2014 Elsevier Interactive Patient Education  2017 [employer].

## 2019-07-28 ENCOUNTER — Encounter: Payer: Self-pay | Admitting: Internal Medicine

## 2019-07-28 ENCOUNTER — Other Ambulatory Visit: Payer: Self-pay

## 2019-07-28 ENCOUNTER — Ambulatory Visit: Payer: Medicare PPO | Admitting: Internal Medicine

## 2019-07-28 VITALS — BP 140/86 | HR 80 | Temp 98.2°F | Ht 71.0 in | Wt 280.0 lb

## 2019-07-28 DIAGNOSIS — E1159 Type 2 diabetes mellitus with other circulatory complications: Secondary | ICD-10-CM | POA: Diagnosis not present

## 2019-07-28 DIAGNOSIS — Z01818 Encounter for other preprocedural examination: Secondary | ICD-10-CM

## 2019-07-28 DIAGNOSIS — E1165 Type 2 diabetes mellitus with hyperglycemia: Secondary | ICD-10-CM

## 2019-07-28 DIAGNOSIS — E538 Deficiency of other specified B group vitamins: Secondary | ICD-10-CM

## 2019-07-28 DIAGNOSIS — E559 Vitamin D deficiency, unspecified: Secondary | ICD-10-CM | POA: Diagnosis not present

## 2019-07-28 DIAGNOSIS — I10 Essential (primary) hypertension: Secondary | ICD-10-CM

## 2019-07-28 DIAGNOSIS — E611 Iron deficiency: Secondary | ICD-10-CM

## 2019-07-28 DIAGNOSIS — Z0000 Encounter for general adult medical examination without abnormal findings: Secondary | ICD-10-CM | POA: Diagnosis not present

## 2019-07-28 DIAGNOSIS — E785 Hyperlipidemia, unspecified: Secondary | ICD-10-CM | POA: Diagnosis not present

## 2019-07-28 DIAGNOSIS — Z0001 Encounter for general adult medical examination with abnormal findings: Secondary | ICD-10-CM

## 2019-07-28 LAB — URINALYSIS, ROUTINE W REFLEX MICROSCOPIC
Bilirubin Urine: NEGATIVE
Hgb urine dipstick: NEGATIVE
Ketones, ur: NEGATIVE
Leukocytes,Ua: NEGATIVE
Nitrite: NEGATIVE
Specific Gravity, Urine: 1.015 (ref 1.000–1.030)
Total Protein, Urine: NEGATIVE
Urine Glucose: >=1000 — AB
Urobilinogen, UA: 0.2 (ref 0.0–1.0)
pH: 6.5 (ref 5.0–8.0)

## 2019-07-28 LAB — HEPATIC FUNCTION PANEL
ALT: 16 U/L (ref 0–53)
AST: 26 U/L (ref 0–37)
Albumin: 4.3 g/dL (ref 3.5–5.2)
Alkaline Phosphatase: 90 U/L (ref 39–117)
Bilirubin, Direct: 0.2 mg/dL (ref 0.0–0.3)
Total Bilirubin: 1 mg/dL (ref 0.2–1.2)
Total Protein: 7.3 g/dL (ref 6.0–8.3)

## 2019-07-28 LAB — BASIC METABOLIC PANEL
BUN: 12 mg/dL (ref 6–23)
CO2: 31 mEq/L (ref 19–32)
Calcium: 10.2 mg/dL (ref 8.4–10.5)
Chloride: 104 mEq/L (ref 96–112)
Creatinine, Ser: 1.43 mg/dL (ref 0.40–1.50)
GFR: 59.09 mL/min — ABNORMAL LOW (ref >60.00)
Glucose, Bld: 96 mg/dL (ref 70–99)
Potassium: 4.3 mEq/L (ref 3.5–5.1)
Sodium: 141 mEq/L (ref 135–145)

## 2019-07-28 LAB — PSA: PSA: 0.23 ng/mL (ref 0.10–4.00)

## 2019-07-28 LAB — LIPID PANEL
Cholesterol: 120 mg/dL (ref 0–200)
HDL: 49.1 mg/dL (ref >39.00)
LDL Cholesterol: 59 mg/dL (ref 0–99)
NonHDL: 70.56
Total CHOL/HDL Ratio: 2
Triglycerides: 56 mg/dL (ref 0.0–149.0)
VLDL: 11.2 mg/dL (ref 0.0–40.0)

## 2019-07-28 LAB — VITAMIN D 25 HYDROXY (VIT D DEFICIENCY, FRACTURES): VITD: 15.76 ng/mL — ABNORMAL LOW (ref 30.00–100.00)

## 2019-07-28 LAB — MICROALBUMIN / CREATININE URINE RATIO
Creatinine,U: 91.7 mg/dL
Microalb Creat Ratio: 0.8 mg/g (ref 0.0–30.0)
Microalb, Ur: 0.8 mg/dL (ref 0.0–1.9)

## 2019-07-28 LAB — HEMOGLOBIN A1C: Hgb A1c MFr Bld: 5.9 % (ref 4.6–6.5)

## 2019-07-28 LAB — VITAMIN B12: Vitamin B-12: 197 pg/mL — ABNORMAL LOW (ref 211–911)

## 2019-07-28 LAB — IBC PANEL
Iron: 54 ug/dL (ref 42–165)
Saturation Ratios: 16.7 % — ABNORMAL LOW (ref 20.0–50.0)
Transferrin: 231 mg/dL (ref 212.0–360.0)

## 2019-07-28 LAB — TSH: TSH: 4.03 u[IU]/mL (ref 0.35–4.50)

## 2019-07-28 NOTE — Assessment & Plan Note (Signed)
stable overall by history and exam, recent data reviewed with pt, and pt to continue medical treatment as before,  to f/u any worsening symptoms or concerns  

## 2019-07-28 NOTE — Assessment & Plan Note (Signed)
For a1c, needs to be less than 7.5

## 2019-07-28 NOTE — Patient Instructions (Signed)

## 2019-07-28 NOTE — Assessment & Plan Note (Addendum)
Ok for right knee surgury as planned  I spent 18 minutes in addition to time for CPX wellness examination in preparing to see the patient by review of recent labs, imaging and procedures, obtaining and reviewing separately obtained history, communicating with the patient and family or caregiver, ordering medications, tests or procedures, and documenting clinical information in the EHR including the differential Dx, treatment, and any further evaluation and other management of preop IM, DM, HTN, hld,

## 2019-07-28 NOTE — Assessment & Plan Note (Signed)

## 2019-07-28 NOTE — Progress Notes (Signed)
Subjective:    Patient ID: Matthew Keller, male    DOB: Mar 10, 1951, 68 y.o.   MRN: [id number]  HPI  Here for wellness and f/u;  Overall doing ok;  Pt denies Chest pain, worsening SOB, DOE, wheezing, orthopnea, PND, worsening LE edema, palpitations, dizziness or syncope.  Pt denies neurological change such as new headache, facial or extremity weakness.  Pt denies polydipsia, polyuria, or low sugar symptoms. Pt states overall good compliance with treatment and medications, good tolerability, and has been trying to follow appropriate diet.  Pt denies worsening depressive symptoms, suicidal ideation or panic. No fever, night sweats, wt loss, loss of appetite, or other constitutional symptoms.  Pt states good ability with ADL's, has low fall risk, home safety reviewed and adequate, no other significant changes in hearing or vision, and only occasionally active with exercise. Wt Readings from Last 3 Encounters:  07/28/19 280 lb (127 kg)  07/27/19 282 lb 6.4 oz (128.1 kg)  07/20/19 279 lb 9.6 oz (126.8 kg)   BP Readings from Last 3 Encounters:  07/28/19 140/86  07/27/19 (!) 160/80  07/20/19 (!) 152/84   Past Medical History:  Diagnosis Date  . Atypical chest pain    [facility] 08-03-01 nl coronaries and lv fn, lvedp 30.  cardiac workup by Dr. Harrington Challenger 5/09 neg cardiolite for ischemia, rec rsik reduction  . Chicken pox   . Diverticulosis    colonoscopy 9/01........Marland KitchenDr. Deatra Ina  . DM (diabetes mellitus) ([place])   . GERD (gastroesophageal reflux disease)   . Healthcare maintenance    pneumovax 09/2006, Td 04/2004, CPX June 12, 2010  . Hypertension   . Microcytic anemia   . Morbid obesity ([place])    all time high 310 2009.  target wt = 208 for BMI <30.  referred back to nutrition again June 12, 2010  . 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 Renal insufficiency    baseline 1.6 October 24, 2009 > 1.3 June 12, 2010  . Sleep apnea   . Thyroid  disease    Past Surgical History:  Procedure Laterality Date  . KNEE SURGERY      reports that he quit smoking about 34 years ago. His smoking use included cigarettes. He has a 0.30 pack-year smoking history. He has never used smokeless tobacco. He reports current alcohol use. He reports that he does not use drugs. family history includes Alcohol abuse in his brother and father; Diabetes in his mother; Throat cancer in his brother. No Known Allergies Current Outpatient Medications on File Prior to Visit  Medication Sig Dispense Refill  . acetaminophen (TYLENOL) 325 MG tablet Take 650 mg by mouth every 6 (six) hours as needed for moderate pain.     . Blood Glucose Monitoring Suppl (ONETOUCH VERIO IQ SYSTEM) w/Device KIT Use to check sugars 1-2 times per day 1 kit 0  . Dulaglutide (TRULICITY) 1.5 YD/7.4JO SOPN Inject 1 pen into the skin once a week. (Patient taking differently: Inject 1.5 mg into the skin once a week. ) 6 pen 3  . empagliflozin (JARDIANCE) 25 MG TABS tablet Take 25 mg by mouth daily. Annual appt due in May must see provider for future refills (Patient taking differently: Take 25 mg by mouth daily. ) 90 tablet 0  . furosemide (LASIX) 20 MG tablet Take 1 tablet (20 mg total) by mouth daily. Annual appt due in MAY must see provider for future refills (Patient  taking differently: Take 20 mg by mouth daily. ) 90 tablet 0  . glipiZIDE (GLUCOTROL XL) 2.5 MG 24 hr tablet TAKE 1 TABLET (2.5 MG TOTAL) BY MOUTH DAILY WITH BREAKFAST. 90 tablet 3  . glucose blood (ONETOUCH VERIO) test strip Use to check sugars 1-2 times per day 100 each 12  . Lancets (ONETOUCH ULTRASOFT) lancets Use as instructed 100 each 12  . latanoprost (XALATAN) 0.005 % ophthalmic solution Place 1 drop into both eyes at bedtime.     [name] levothyroxine (SYNTHROID) 75 MCG tablet TAKE 1 TABLET BY MOUTH EVERY DAY (Patient taking differently: Take 75 mcg by mouth daily before breakfast. ) 90 tablet 3  . losartan (COZAAR) 100 MG  tablet Take 1 tablet (100 mg total) by mouth daily. Annual appt due in May must see provider for future refills (Patient taking differently: Take 100 mg by mouth daily. ) 90 tablet 0  . metFORMIN (GLUCOPHAGE-XR) 500 MG 24 hr tablet TAKE 1 TABLET BY MOUTH EVERY DAY WITH BREAKFAST (Patient taking differently: Take 500 mg by mouth daily with breakfast. ) 90 tablet 3  . metoprolol succinate (TOPROL-XL) 25 MG 24 hr tablet TAKE 1 TABLET BY MOUTH EVERY DAY (Patient taking differently: Take 25 mg by mouth daily. ) 90 tablet 3  . minoxidil (LONITEN) 10 MG tablet Take 1 tablet in the morning and 1/2 tablet in the evening. Annual appt due in MAY must see provider for future refills (Patient taking differently: Take 5-10 mg by mouth See admin instructions. Take 10 mg by mouth in the morning and 5 mg in the evening) 135 tablet 0  . omeprazole (PRILOSEC) 40 MG capsule Take 1 capsule (40 mg total) by mouth daily. Annual appt due in MAY must see provider for future refills 30 capsule 0  . rosuvastatin (CRESTOR) 10 MG tablet Take 1 tablet (10 mg total) by mouth daily. Annual appt due in MAY must see provider for future refills (Patient taking differently: Take 10 mg by mouth daily. ) 90 tablet 0  . sildenafil (VIAGRA) 100 MG tablet Take 1 tablet (100 mg total) by mouth daily as needed for erectile dysfunction. 9 tablet 0  . verapamil (CALAN-SR) 240 MG CR tablet Take 1 tablet (240 mg total) by mouth daily. Annual appt due in MAY must see provider for future refills 90 tablet 3   No current facility-administered medications on file prior to visit.   Review of Systems All otherwise neg per pt    Objective:   Physical Exam BP 140/86 (BP Location: Left Arm, Patient Position: Sitting, Cuff Size: Large)   Pulse 80   Temp 98.2 F (36.8 C) (Oral)   Ht 5' 11"  (1.803 m)   Wt 280 lb (127 kg)   SpO2 97%   BMI 39.05 kg/m  VS noted,  Constitutional: Pt appears in NAD HENT: Head: NCAT.  Right Ear: External ear normal.   Left Ear: External ear normal.  Eyes: . Pupils are equal, round, and reactive to light. Conjunctivae and EOM are normal Nose: without d/c or deformity Neck: Neck supple. Gross normal ROM Cardiovascular: Normal rate and regular rhythm.   Pulmonary/Chest: Effort normal and breath sounds without rales or wheezing.  Abd:  Soft, NT, ND, + BS, no organomegaly Neurological: Pt is alert. At baseline orientation, motor grossly intact Skin: Skin is warm. No rashes, other new lesions, no LE edema Psychiatric: Pt behavior is normal without agitation  All otherwise neg per pt  Lab Results  Component Value Date  WBC 9.0 07/29/2017   HGB 12.4 (L) 07/29/2017   HCT 36.2 (L) 07/29/2017   PLT 274.0 07/29/2017   GLUCOSE 223 (H) 02/25/2018   CHOL 107 02/25/2018   TRIG 107.0 02/25/2018   HDL 40.30 02/25/2018   LDLCALC 45 02/25/2018   ALT 24 02/25/2018   AST 21 02/25/2018   NA 140 02/25/2018   K 3.8 02/25/2018   CL 101 02/25/2018   CREATININE 1.70 (H) 02/25/2018   BUN 19 02/25/2018   CO2 31 02/25/2018   TSH 2.65 07/29/2017   PSA 0.23 07/29/2017   HGBA1C 7.6 (H) 02/25/2018   MICROALBUR <0.7 07/29/2017      Assessment & Plan:

## 2019-07-29 ENCOUNTER — Encounter: Payer: Self-pay | Admitting: Internal Medicine

## 2019-07-29 ENCOUNTER — Other Ambulatory Visit: Payer: Self-pay | Admitting: Internal Medicine

## 2019-07-29 ENCOUNTER — Telehealth: Payer: Self-pay

## 2019-07-29 LAB — CBC WITH DIFFERENTIAL/PLATELET
Basophils Absolute: 0.1 10*3/uL (ref 0.0–0.1)
Basophils Relative: 0.9 % (ref 0.0–3.0)
Eosinophils Absolute: 0.3 10*3/uL (ref 0.0–0.7)
Eosinophils Relative: 3.4 % (ref 0.0–5.0)
HCT: 38.9 % — ABNORMAL LOW (ref 39.0–52.0)
Hemoglobin: 13 g/dL (ref 13.0–17.0)
Lymphocytes Relative: 22.4 % (ref 12.0–46.0)
Lymphs Abs: 1.7 10*3/uL (ref 0.7–4.0)
MCHC: 33.3 g/dL (ref 30.0–36.0)
MCV: 75.7 fl — ABNORMAL LOW (ref 78.0–100.0)
Monocytes Absolute: 0.5 10*3/uL (ref 0.1–1.0)
Monocytes Relative: 6.8 % (ref 3.0–12.0)
Neutro Abs: 4.9 10*3/uL (ref 1.4–7.7)
Neutrophils Relative %: 66.5 % (ref 43.0–77.0)
Platelets: 232 10*3/uL (ref 150.0–400.0)
RBC: 5.14 Mil/uL (ref 4.22–5.81)
RDW: 17.1 % — ABNORMAL HIGH (ref 11.5–15.5)
WBC: 7.4 10*3/uL (ref 4.0–10.5)

## 2019-07-29 MED ORDER — VITAMIN D (ERGOCALCIFEROL) 1.25 MG (50000 UNIT) PO CAPS: 50000.0000 [IU] | ORAL_CAPSULE | ORAL | 0 refills | Status: DC

## 2019-07-29 MED ORDER — VITAMIN B-12 1000 MCG PO TABS: 1000.0000 ug | ORAL_TABLET | Freq: Every day | ORAL | 3 refills | Status: DC

## 2019-07-29 NOTE — Telephone Encounter (Signed)
Matthew Keller with [facility] calling and states that a surgical clearance was sent over on 07/14/2019 and she was calling to check the status of this form. Please advise.  CB#: [phone]

## 2019-07-29 NOTE — Telephone Encounter (Signed)
Papers were signed by Dr. Jenny Reichmann this morning and faxed as well.

## 2019-07-30 NOTE — Progress Notes (Signed)
PCP - Cathlean Cower, MD  Cardiologist - Dr. Sallyanne Kuster w/cardiac clearance in Epic date 07/20/19 by Fabian Sharp, PA  Chest x-ray -  EKG - 07-20-19 Stress Test -  ECHO -  Cardiac Cath -   Sleep Study -  CPAP -   Fasting Blood Sugar -  Checks Blood Sugar _____ times a day  HGA1C 5.9 in Epic dated 07-28-19   CBC w/Diff 07-28-19  BMP 07-28-19  Blood Thinner Instructions: Aspirin Instructions: Last Dose:  Anesthesia review:   Patient denies shortness of breath, fever, cough and chest pain at PAT appointment   Patient verbalized understanding of instructions that were given to them at the PAT appointment. Patient was also instructed that they will need to review over the PAT instructions again at home before surgery.

## 2019-07-30 NOTE — Patient Instructions (Addendum)
DUE TO COVID-19 ONLY ONE VISITOR IS ALLOWED TO COME WITH YOU AND STAY IN THE WAITING ROOM ONLY DURING PRE OP AND PROCEDURE DAY OF SURGERY. THE 1 VISITOR MAY VISIT WITH YOU AFTER SURGERY IN YOUR PRIVATE ROOM DURING VISITING HOURS ONLY!  YOU NEED TO HAVE A COVID 19 TEST ON 08-06-19 @ 2:35 PM, THIS TEST MUST BE DONE BEFORE SURGERY, COME  [place], [place] , [address].  ([place]) ONCE YOUR COVID TEST IS COMPLETED, PLEASE BEGIN THE QUARANTINE INSTRUCTIONS AS OUTLINED IN YOUR HANDOUT.                Matthew Keller  07/30/2019   Your procedure is scheduled on: 08-10-19    Report to [facility] Main  Entrance    Report to admitting at 9:45 AM     Call this number if you have problems the morning of surgery [phone]    Remember:AFTER MIDNIGHT THE NIGHT PRIOR TO SURGERY. NOTHING BY MOUTH EXCEPT CLEAR LIQUIDS UNTIL 9:15 AM . PLEASE FINISH G2 DRINK PER SURGEON ORDER  WHICH NEEDS TO BE COMPLETED AT 9:15 AM.    Take these medicines the morning of surgery with A SIP OF WATER: Levothyroxine (Synthroid), Metoprolol Succinate, Omeprazole (Prilosec), and Rosuvastatin (Crestor)    CLEAR LIQUID DIET   Foods Allowed                                                                     Foods Excluded  Coffee and tea, regular and decaf                             liquids that you cannot  Plain Jell-O any favor except red or purple                                           see through such as: Fruit ices (not with fruit pulp)                                     milk, soups, orange juice  Iced Popsicles                                    All solid food Carbonated beverages, regular and diet                                    Cranberry, grape and apple juices Sports drinks like Gatorade Lightly seasoned clear broth or consume(fat free) Sugar, honey syrup   _____________________________________________________________________    DO NOT TAKE ANY DIABETIC MEDICATIONS  DAY OF YOUR SURGERY                               You may not have any metal on your body including hair pins and  piercings     Do not wear jewelry, cologne, lotions, powders or deodorant                   Men may shave face and neck.   Do not bring valuables to the hospital. [place].  Contacts, dentures or bridgework may not be worn into surgery.  You may bring overnight bag    Special Instructions: N/A              Please read over the following fact sheets you were given: _____________________________________________________________________  How to Manage Your Diabetes Before and After Surgery  Why is it important to control my blood sugar before and after surgery? . Improving blood sugar levels before and after surgery helps healing and can limit problems. . A way of improving blood sugar control is eating a healthy diet by: o  Eating less sugar and carbohydrates o  Increasing activity/exercise o  Talking with your doctor about reaching your blood sugar goals . High blood sugars (greater than 180 mg/dL) can raise your risk of infections and slow your recovery, so you will need to focus on controlling your diabetes during the weeks before surgery. . Make sure that the doctor who takes care of your diabetes knows about your planned surgery including the date and location.  How do I manage my blood sugar before surgery? . Check your blood sugar at least 4 times a day, starting 2 days before surgery, to make sure that the level is not too high or low. o Check your blood sugar the morning of your surgery when you wake up and every 2 hours until you get to the Short Stay unit. . If your blood sugar is less than 70 mg/dL, you will need to treat for low blood sugar: o Do not take insulin. o Treat a low blood sugar (less than 70 mg/dL) with  cup of clear juice (cranberry or apple), 4 glucose tablets, OR glucose  gel. o Recheck blood sugar in 15 minutes after treatment (to make sure it is greater than 70 mg/dL). If your blood sugar is not greater than 70 mg/dL on recheck, call [phone] for further instructions. . Report your blood sugar to the short stay nurse when you get to Short Stay.  . If you are admitted to the hospital after surgery: o Your blood sugar will be checked by the staff and you will probably be given insulin after surgery (instead of oral diabetes medicines) to make sure you have good blood sugar levels. o The goal for blood sugar control after surgery is 80-180 mg/dL.   WHAT DO I DO ABOUT MY DIABETES MEDICATION?  [name] Do not take oral diabetes medicines (pills) the morning of surgery.  . THE DAY BEFORE SURGERY, take your usual Metformin, and Glipizide.  However, donot take your Jardiance.       . The day of surgery, do not take other diabetes injectables, including Byetta (exenatide), Bydureon (exenatide ER), Victoza (liraglutide), or Trulicity (dulaglutide).   Reviewed and Endorsed by [facility] Patient Education Committee, August 2015           [facility] - Preparing for Surgery Before surgery, you can play an important role.  Because skin is not sterile, your skin needs to be as free of germs as possible.  You can reduce the number of germs on your  skin by washing with CHG (chlorahexidine gluconate) soap before surgery.  CHG is an antiseptic cleaner which kills germs and bonds with the skin to continue killing germs even after washing. Please DO NOT use if you have an allergy to CHG or antibacterial soaps.  If your skin becomes reddened/irritated stop using the CHG and inform your nurse when you arrive at Short Stay. Do not shave (including legs and underarms) for at least 48 hours prior to the first CHG shower.  You may shave your face/neck. Please follow these instructions carefully:  1.  Shower with CHG Soap the night before surgery and the  morning of Surgery.  2.  If  you choose to wash your hair, wash your hair first as usual with your  normal  shampoo.  3.  After you shampoo, rinse your hair and body thoroughly to remove the  shampoo.                           4.  Use CHG as you would any other liquid soap.  You can apply chg directly  to the skin and wash                       Gently with a scrungie or clean washcloth.  5.  Apply the CHG Soap to your body ONLY FROM THE NECK DOWN.   Do not use on face/ open                           Wound or open sores. Avoid contact with eyes, ears mouth and genitals (private parts).                       Wash face,  Genitals (private parts) with your normal soap.             6.  Wash thoroughly, paying special attention to the area where your surgery  will be performed.  7.  Thoroughly rinse your body with warm water from the neck down.  8.  DO NOT shower/wash with your normal soap after using and rinsing off  the CHG Soap.                9.  Pat yourself dry with a clean towel.            10.  Wear clean pajamas.            11.  Place clean sheets on your bed the night of your first shower and do not  sleep with pets. Day of Surgery : Do not apply any lotions/deodorants the morning of surgery.  Please wear clean clothes to the hospital/surgery center.  FAILURE TO FOLLOW THESE INSTRUCTIONS MAY RESULT IN THE CANCELLATION OF YOUR SURGERY PATIENT SIGNATURE_________________________________  NURSE SIGNATURE__________________________________  ________________________________________________________________________   Adam Phenix  An incentive spirometer is a tool that can help keep your lungs clear and active. This tool measures how well you are filling your lungs with each breath. Taking long deep breaths may help reverse or decrease the chance of developing breathing (pulmonary) problems (especially infection) following:  A long period of time when you are unable to move or be active. BEFORE THE PROCEDURE    If the spirometer includes an indicator to show your best effort, your nurse or respiratory therapist will set it to a desired goal.  If possible,  sit up straight or lean slightly forward. Try not to slouch.  Hold the incentive spirometer in an upright position. INSTRUCTIONS FOR USE  1. Sit on the edge of your bed if possible, or sit up as far as you can in bed or on a chair. 2. Hold the incentive spirometer in an upright position. 3. Breathe out normally. 4. Place the mouthpiece in your mouth and seal your lips tightly around it. 5. Breathe in slowly and as deeply as possible, raising the piston or the ball toward the top of the column. 6. Hold your breath for 3-5 seconds or for as long as possible. Allow the piston or ball to fall to the bottom of the column. 7. Remove the mouthpiece from your mouth and breathe out normally. 8. Rest for a few seconds and repeat Steps 1 through 7 at least 10 times every 1-2 hours when you are awake. Take your time and take a few normal breaths between deep breaths. 9. The spirometer may include an indicator to show your best effort. Use the indicator as a goal to work toward during each repetition. 10. After each set of 10 deep breaths, practice coughing to be sure your lungs are clear. If you have an incision (the cut made at the time of surgery), support your incision when coughing by placing a pillow or rolled up towels firmly against it. Once you are able to get out of bed, walk around indoors and cough well. You may stop using the incentive spirometer when instructed by your caregiver.  RISKS AND COMPLICATIONS  Take your time so you do not get dizzy or light-headed.  If you are in pain, you may need to take or ask for pain medication before doing incentive spirometry. It is harder to take a deep breath if you are having pain. AFTER USE  Rest and breathe slowly and easily.  It can be helpful to keep track of a log of your progress. Your caregiver  can provide you with a simple table to help with this. If you are using the spirometer at home, follow these instructions: [place] IF:   You are having difficultly using the spirometer.  You have trouble using the spirometer as often as instructed.  Your pain medication is not giving enough relief while using the spirometer.  You develop fever of 100.5 F (38.1 C) or higher. SEEK IMMEDIATE MEDICAL CARE IF:   You cough up bloody sputum that had not been present before.  You develop fever of 102 F (38.9 C) or greater.  You develop worsening pain at or near the incision site. MAKE SURE YOU:   Understand these instructions.  Will watch your condition.  Will get help right away if you are not doing well or get worse. Document Released: 08/05/2006 Document Revised: 06/17/2011 Document Reviewed: 10/06/2006 [facility] Patient Information 2014 [place], [place].   ________________________________________________________________________

## 2019-08-03 ENCOUNTER — Other Ambulatory Visit: Payer: Self-pay

## 2019-08-03 ENCOUNTER — Encounter (HOSPITAL_COMMUNITY)
Admission: RE | Admit: 2019-08-03 | Discharge: 2019-08-03 | Disposition: A | Discharge status: Home / Self Care | Payer: Medicare PPO | Source: Ambulatory Visit | Attending: Orthopaedic Surgery | Admitting: Orthopaedic Surgery

## 2019-08-03 ENCOUNTER — Ambulatory Visit (HOSPITAL_COMMUNITY)
Admission: RE | Admit: 2019-08-03 | Discharge: 2019-08-03 | Disposition: A | Discharge status: Home / Self Care | Payer: Medicare PPO | Source: Ambulatory Visit | Attending: Orthopaedic Surgery | Admitting: Orthopaedic Surgery

## 2019-08-03 ENCOUNTER — Encounter (HOSPITAL_COMMUNITY): Payer: Self-pay

## 2019-08-03 DIAGNOSIS — Z01818 Encounter for other preprocedural examination: Secondary | ICD-10-CM

## 2019-08-03 LAB — URINALYSIS, ROUTINE W REFLEX MICROSCOPIC
Bacteria, UA: NONE SEEN
Bilirubin Urine: NEGATIVE
Glucose, UA: >=500 mg/dL — AB
Hgb urine dipstick: NEGATIVE
Ketones, ur: NEGATIVE mg/dL
Leukocytes,Ua: NEGATIVE
Nitrite: NEGATIVE
Protein, ur: NEGATIVE mg/dL
Specific Gravity, Urine: 1.011 (ref 1.005–1.030)
pH: 6 (ref 5.0–8.0)

## 2019-08-03 LAB — SURGICAL PCR SCREEN
MRSA, PCR: NEGATIVE
Staphylococcus aureus: NEGATIVE

## 2019-08-03 LAB — GLUCOSE, CAPILLARY: Glucose-Capillary: 95 mg/dL (ref 70–99)

## 2019-08-03 LAB — PROTIME-INR
INR: 1 (ref 0.8–1.2)
Prothrombin Time: 13 seconds (ref 11.4–15.2)

## 2019-08-03 LAB — ABO/RH: ABO/RH(D): B POS

## 2019-08-03 LAB — APTT: aPTT: 31 seconds (ref 24–36)

## 2019-08-06 ENCOUNTER — Other Ambulatory Visit (HOSPITAL_COMMUNITY)
Admission: RE | Admit: 2019-08-06 | Discharge: 2019-08-06 | Disposition: A | Discharge status: Home / Self Care | Payer: Medicare PPO | Source: Ambulatory Visit | Attending: Orthopaedic Surgery | Admitting: Orthopaedic Surgery

## 2019-08-06 DIAGNOSIS — Z20822 Contact with and (suspected) exposure to covid-19: Secondary | ICD-10-CM | POA: Insufficient documentation

## 2019-08-06 DIAGNOSIS — Z01812 Encounter for preprocedural laboratory examination: Secondary | ICD-10-CM | POA: Insufficient documentation

## 2019-08-06 NOTE — H&P (Signed)
TOTAL KNEE ADMISSION H&P  Patient is being admitted for right total knee arthroplasty.  Subjective:  Chief Complaint:right knee pain.  HPI: Matthew Keller, 69 y.o. male, has a history of pain and functional disability in the right knee due to arthritis and has failed non-surgical conservative treatments for greater than 12 weeks to includeNSAID's and/or analgesics, corticosteriod injections, flexibility and strengthening excercises, supervised PT with diminished ADL's post treatment, use of assistive devices, weight reduction as appropriate and activity modification.  Onset of symptoms was gradual, starting 5 years ago with gradually worsening course since that time. The patient noted prior procedures on the knee to include  menisectomy on the right knee(s).  Patient currently rates pain in the right knee(s) at 10 out of 10 with activity. Patient has night pain, worsening of pain with activity and weight bearing, pain that interferes with activities of daily living, crepitus and joint swelling.  Patient has evidence of subchondral cysts, subchondral sclerosis, periarticular osteophytes and joint space narrowing by imaging studies. There is no active infection.  Patient Active Problem List   Diagnosis Date Noted  . Preop exam for internal medicine 07/28/2019  . CKD (chronic kidney disease) stage 3, GFR 30-59 ml/min 08/26/2018  . Gastrointestinal complaints 04/10/2017  . Abnormal cardiovascular function study 10/01/2016  . Dyslipidemia 10/01/2016  . CAD (coronary artery disease) 10/01/2016  . Medicare annual wellness visit, subsequent 07/16/2016  . Erectile dysfunction 06/29/2015  . Encounter for well adult exam with abnormal findings 11/07/2014  . Low back pain 11/07/2014  . Peripheral vascular disease ([place]) 07/31/2011  . Atypical chest pain 07/23/2010  . Diverticulosis 07/23/2010  . HYPERCHOLESTEROLEMIA 06/13/2009  . BENIGN PROSTATIC HYPERTROPHY, WITH URINARY OBSTRUCTION 06/13/2009  .  DIZZINESS 08/04/2008  . IBS 11/05/2007  . Obstructive sleep apnea 08/27/2007  . DYSPNEA 07/16/2007  . Hypothyroidism 02/18/2007  . Poorly controlled type 2 diabetes mellitus with circulatory disorder ([place]) 02/18/2007  . Morbid obesity ([place]) 02/18/2007  . ANEMIA, MICROCYTIC 02/18/2007  . Essential hypertension 02/18/2007  . Disorder resulting from impaired renal function 02/18/2007   Past Medical History:  Diagnosis Date  . Atypical chest pain    [facility] 08-03-01 nl coronaries and lv fn, lvedp 30.  cardiac workup by Dr. Harrington Challenger 5/09 neg cardiolite for ischemia, rec rsik reduction  . Chicken pox   . Diverticulosis    colonoscopy 9/01........Marland KitchenDr. Deatra Ina  . DM (diabetes mellitus) ([place])   . GERD (gastroesophageal reflux disease)   . Healthcare maintenance    pneumovax 09/2006, Td 04/2004, CPX June 12, 2010  . Hypertension   . Microcytic anemia   . Morbid obesity ([place])    all time high 310 2009.  target wt = 208 for BMI <30.  referred back to nutrition again June 12, 2010  . 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 Renal insufficiency    baseline 1.6 October 24, 2009 > 1.3 June 12, 2010  . Sleep apnea   . Thyroid disease     Past Surgical History:  Procedure Laterality Date  . KNEE SURGERY      No current facility-administered medications for this encounter.   Current Outpatient Medications  Medication Sig Dispense Refill Last Dose  . acetaminophen (TYLENOL) 325 MG tablet Take 650 mg by mouth every 6 (six) hours as needed for moderate pain.      . Dulaglutide (TRULICITY) 1.5 JS/9.7WY SOPN Inject 1 pen into the skin once a week. (  Patient taking differently: Inject 1.5 mg into the skin once a week. ) 6 pen 3   . empagliflozin (JARDIANCE) 25 MG TABS tablet Take 25 mg by mouth daily. Annual appt due in May must see provider for future refills (Patient taking differently: Take 25 mg by mouth daily. ) 90 tablet 0   .  furosemide (LASIX) 20 MG tablet Take 1 tablet (20 mg total) by mouth daily. Annual appt due in MAY must see provider for future refills (Patient taking differently: Take 20 mg by mouth daily. ) 90 tablet 0   . glipiZIDE (GLUCOTROL XL) 2.5 MG 24 hr tablet TAKE 1 TABLET (2.5 MG TOTAL) BY MOUTH DAILY WITH BREAKFAST. 90 tablet 3   . latanoprost (XALATAN) 0.005 % ophthalmic solution Place 1 drop into both eyes at bedtime.      [name] levothyroxine (SYNTHROID) 75 MCG tablet TAKE 1 TABLET BY MOUTH EVERY DAY (Patient taking differently: Take 75 mcg by mouth daily before breakfast. ) 90 tablet 3   . losartan (COZAAR) 100 MG tablet Take 1 tablet (100 mg total) by mouth daily. Annual appt due in May must see provider for future refills (Patient taking differently: Take 100 mg by mouth daily. ) 90 tablet 0   . metFORMIN (GLUCOPHAGE-XR) 500 MG 24 hr tablet TAKE 1 TABLET BY MOUTH EVERY DAY WITH BREAKFAST (Patient taking differently: Take 500 mg by mouth daily with breakfast. ) 90 tablet 3   . metoprolol succinate (TOPROL-XL) 25 MG 24 hr tablet TAKE 1 TABLET BY MOUTH EVERY DAY (Patient taking differently: Take 25 mg by mouth daily. ) 90 tablet 3   . minoxidil (LONITEN) 10 MG tablet Take 1 tablet in the morning and 1/2 tablet in the evening. Annual appt due in MAY must see provider for future refills (Patient taking differently: Take 5-10 mg by mouth See admin instructions. Take 10 mg by mouth in the morning and 5 mg in the evening) 135 tablet 0   . rosuvastatin (CRESTOR) 10 MG tablet Take 1 tablet (10 mg total) by mouth daily. Annual appt due in MAY must see provider for future refills (Patient taking differently: Take 10 mg by mouth daily. ) 90 tablet 0   . verapamil (CALAN-SR) 240 MG CR tablet Take 1 tablet (240 mg total) by mouth daily. Annual appt due in MAY must see provider for future refills 90 tablet 3   . Blood Glucose Monitoring Suppl (ONETOUCH VERIO IQ SYSTEM) w/Device KIT Use to check sugars 1-2 times per day 1  kit 0   . glucose blood (ONETOUCH VERIO) test strip Use to check sugars 1-2 times per day 100 each 12   . Lancets (ONETOUCH ULTRASOFT) lancets Use as instructed 100 each 12   . omeprazole (PRILOSEC) 40 MG capsule Take 1 capsule (40 mg total) by mouth daily. Annual appt due in MAY must see provider for future refills 30 capsule 0   . sildenafil (VIAGRA) 100 MG tablet Take 1 tablet (100 mg total) by mouth daily as needed for erectile dysfunction. 9 tablet 0   . vitamin B-12 (CYANOCOBALAMIN) 1000 MCG tablet Take 1 tablet (1,000 mcg total) by mouth daily. 90 tablet 3   . Vitamin D, Ergocalciferol, (DRISDOL) 1.25 MG (50000 UNIT) CAPS capsule Take 1 capsule (50,000 Units total) by mouth every 7 (seven) days. 12 capsule 0    No Known Allergies  Social History   Tobacco Use  . Smoking status: Former Smoker    Packs/day: 0.30  Years: 1.00    Pack years: 0.30    Types: Cigarettes    Quit date: 04/08/1985    Years since quitting: 34.3  . Smokeless tobacco: Never Used  Substance Use Topics  . Alcohol use: Yes    Comment: occasional    Family History  Problem Relation Age of Onset  . Diabetes Mother   . Alcohol abuse Father   . Throat cancer Brother        smoker  . Alcohol abuse Brother   . Colon cancer Neg Hx   . Esophageal cancer Neg Hx   . Liver cancer Neg Hx   . Pancreatic cancer Neg Hx   . Rectal cancer Neg Hx   . Stomach cancer Neg Hx      Review of Systems  Musculoskeletal: Positive for arthralgias.       Right knee  All other systems reviewed and are negative.   Objective:  Physical Exam  Constitutional: He is oriented to person, place, and time. He appears well-developed and well-nourished.  HENT:  Head: Normocephalic and atraumatic.  Eyes: Pupils are equal, round, and reactive to light.  Cardiovascular: Normal rate and regular rhythm.  Respiratory: Effort normal.  GI: Soft.  Musculoskeletal:     Cervical back: Normal range of motion.     Comments: His right knee  has range of motion from 0-120.  He has an old medial scar with no effusion.  He has medial joint line pain and crepitation.  Hip motion is full and straight leg raise is negative.  Opposite knee is painless.    Neurological: He is alert and oriented to person, place, and time.  Skin: Skin is warm and dry.  Psychiatric: He has a normal mood and affect. His behavior is normal. Judgment and thought content normal.    Vital signs in last 24 hours:    Labs:   Estimated body mass index is 38.23 kg/m as calculated from the following:   Height as of 08/03/19: 5' 11" (1.803 m).   Weight as of 08/03/19: 124.3 kg.   Imaging Review Plain radiographs demonstrate severe degenerative joint disease of the right knee(s). The overall alignment isneutral. The bone quality appears to be good for age and reported activity level.      Assessment/Plan:  End stage primary arthritis, right knee   The patient history, physical examination, clinical judgment of the provider and imaging studies are consistent with end stage degenerative joint disease of the right knee(s) and total knee arthroplasty is deemed medically necessary. The treatment options including medical management, injection therapy arthroscopy and arthroplasty were discussed at length. The risks and benefits of total knee arthroplasty were presented and reviewed. The risks due to aseptic loosening, infection, stiffness, patella tracking problems, thromboembolic complications and other imponderables were discussed. The patient acknowledged the explanation, agreed to proceed with the plan and consent was signed. Patient is being admitted for inpatient treatment for surgery, pain control, PT, OT, prophylactic antibiotics, VTE prophylaxis, progressive ambulation and ADL's and discharge planning. The patient is planning to be discharged home with home health services     Patient's anticipated LOS is less than 2 midnights, meeting these  requirements: - Younger than 44 - Lives within 1 hour of care - Has a competent adult at home to recover with post-op recover - NO history of  - Chronic pain requiring opiods  - Diabetes  - Coronary Artery Disease  - Heart failure  - Heart attack  - Stroke  -  DVT/VTE  - Cardiac arrhythmia  - Respiratory Failure/COPD  - Renal failure  - Anemia  - Advanced Liver disease

## 2019-08-07 LAB — SARS CORONAVIRUS 2 (TAT 6-24 HRS): SARS Coronavirus 2: NEGATIVE

## 2019-08-09 MED ORDER — TRANEXAMIC ACID 1000 MG/10ML IV SOLN
2000.0000 mg | INTRAVENOUS | Status: DC
  Filled 2019-08-09: qty 20

## 2019-08-09 MED ORDER — DEXTROSE 5 % IV SOLN
3.0000 g | INTRAVENOUS | Status: AC
  Administered 2019-08-10: 13:00:00 2 g via INTRAVENOUS
  Filled 2019-08-09: qty 3

## 2019-08-09 MED ORDER — BUPIVACAINE LIPOSOME 1.3 % IJ SUSP
20.0000 mL | INTRAMUSCULAR | Status: DC
  Filled 2019-08-09: qty 20

## 2019-08-09 NOTE — Care Plan (Signed)
Ortho Bundle Case Management Note  Patient Details  Name: Matthew Keller MRN: [id number] Date of Birth: 04-Apr-1951  Spoke with patient prior to surgery. He will discharge to home with family. HHPT referral to [facility] for 5 visits and then will transition to OPPT at [place] walker ordered for home use. Patient and MD in agreement with plan. Choice offered                    DME Arranged:  [place] [facility] DME Agency:  [employer]  HH Arranged:  PT [place] Agency:  [facility] (formerly [facility]  Additional Comments: Please contact me with any questions of if this plan should need to change.  Ladell Heads,  [place] [facility]  [phone] 08/09/2019, 2:46 PM

## 2019-08-10 ENCOUNTER — Encounter (HOSPITAL_COMMUNITY): Payer: Self-pay | Admitting: Orthopaedic Surgery

## 2019-08-10 ENCOUNTER — Ambulatory Visit (HOSPITAL_COMMUNITY): Payer: Medicare PPO | Admitting: Anesthesiology

## 2019-08-10 ENCOUNTER — Encounter (HOSPITAL_COMMUNITY)
Admission: RE | Disposition: A | Discharge status: Home / Self Care | Payer: Self-pay | Source: Home / Self Care | Attending: Orthopaedic Surgery

## 2019-08-10 ENCOUNTER — Observation Stay (HOSPITAL_COMMUNITY)
Admission: RE | Admit: 2019-08-10 | Discharge: 2019-08-11 | Disposition: A | Discharge status: Home / Self Care | Payer: Medicare PPO | Attending: Orthopaedic Surgery | Admitting: Orthopaedic Surgery

## 2019-08-10 ENCOUNTER — Other Ambulatory Visit: Payer: Self-pay

## 2019-08-10 DIAGNOSIS — Z79890 Hormone replacement therapy (postmenopausal): Secondary | ICD-10-CM | POA: Diagnosis not present

## 2019-08-10 DIAGNOSIS — Z6838 Body mass index (BMI) 38.0-38.9, adult: Secondary | ICD-10-CM | POA: Insufficient documentation

## 2019-08-10 DIAGNOSIS — N183 Chronic kidney disease, stage 3 unspecified: Secondary | ICD-10-CM | POA: Diagnosis not present

## 2019-08-10 DIAGNOSIS — E1151 Type 2 diabetes mellitus with diabetic peripheral angiopathy without gangrene: Secondary | ICD-10-CM | POA: Diagnosis not present

## 2019-08-10 DIAGNOSIS — I2510 Atherosclerotic heart disease of native coronary artery without angina pectoris: Secondary | ICD-10-CM | POA: Insufficient documentation

## 2019-08-10 DIAGNOSIS — Z87891 Personal history of nicotine dependence: Secondary | ICD-10-CM | POA: Insufficient documentation

## 2019-08-10 DIAGNOSIS — E785 Hyperlipidemia, unspecified: Secondary | ICD-10-CM | POA: Diagnosis not present

## 2019-08-10 DIAGNOSIS — Z7984 Long term (current) use of oral hypoglycemic drugs: Secondary | ICD-10-CM | POA: Insufficient documentation

## 2019-08-10 DIAGNOSIS — E1122 Type 2 diabetes mellitus with diabetic chronic kidney disease: Secondary | ICD-10-CM | POA: Diagnosis not present

## 2019-08-10 DIAGNOSIS — G8918 Other acute postprocedural pain: Secondary | ICD-10-CM | POA: Diagnosis not present

## 2019-08-10 DIAGNOSIS — Z79899 Other long term (current) drug therapy: Secondary | ICD-10-CM | POA: Insufficient documentation

## 2019-08-10 DIAGNOSIS — E7800 Pure hypercholesterolemia, unspecified: Secondary | ICD-10-CM | POA: Insufficient documentation

## 2019-08-10 DIAGNOSIS — M1711 Unilateral primary osteoarthritis, right knee: Principal | ICD-10-CM | POA: Insufficient documentation

## 2019-08-10 DIAGNOSIS — I129 Hypertensive chronic kidney disease with stage 1 through stage 4 chronic kidney disease, or unspecified chronic kidney disease: Secondary | ICD-10-CM | POA: Diagnosis not present

## 2019-08-10 DIAGNOSIS — E1165 Type 2 diabetes mellitus with hyperglycemia: Secondary | ICD-10-CM | POA: Diagnosis not present

## 2019-08-10 DIAGNOSIS — N529 Male erectile dysfunction, unspecified: Secondary | ICD-10-CM | POA: Diagnosis not present

## 2019-08-10 DIAGNOSIS — E039 Hypothyroidism, unspecified: Secondary | ICD-10-CM | POA: Diagnosis not present

## 2019-08-10 DIAGNOSIS — G4733 Obstructive sleep apnea (adult) (pediatric): Secondary | ICD-10-CM | POA: Diagnosis not present

## 2019-08-10 DIAGNOSIS — I10 Essential (primary) hypertension: Secondary | ICD-10-CM | POA: Diagnosis not present

## 2019-08-10 HISTORY — PX: TOTAL KNEE ARTHROPLASTY: SHX125

## 2019-08-10 LAB — TYPE AND SCREEN
ABO/RH(D): B POS
Antibody Screen: NEGATIVE

## 2019-08-10 LAB — GLUCOSE, CAPILLARY
Glucose-Capillary: 83 mg/dL (ref 70–99)
Glucose-Capillary: 96 mg/dL (ref 70–99)

## 2019-08-10 SURGERY — ARTHROPLASTY, KNEE, TOTAL
Anesthesia: Spinal | Site: Knee | Laterality: Right

## 2019-08-10 MED ORDER — PROPOFOL 10 MG/ML IV BOLUS
INTRAVENOUS | Status: AC
  Filled 2019-08-10: qty 20

## 2019-08-10 MED ORDER — MINOXIDIL 2.5 MG PO TABS: 5.0000 mg | ORAL_TABLET | ORAL | Status: DC

## 2019-08-10 MED ORDER — METHOCARBAMOL 500 MG IVPB - SIMPLE MED
500.0000 mg | Freq: Four times a day (QID) | INTRAVENOUS | Status: DC | PRN
  Filled 2019-08-10: qty 50

## 2019-08-10 MED ORDER — BUPIVACAINE IN DEXTROSE 0.75-8.25 % IT SOLN
INTRATHECAL | Status: DC | PRN
  Administered 2019-08-10: 1.6 mL via INTRATHECAL

## 2019-08-10 MED ORDER — ROSUVASTATIN CALCIUM 10 MG PO TABS
10.0000 mg | ORAL_TABLET | Freq: Every day | ORAL | Status: DC
  Administered 2019-08-11: 10 mg via ORAL
  Filled 2019-08-10: qty 1

## 2019-08-10 MED ORDER — FENTANYL CITRATE (PF) 100 MCG/2ML IJ SOLN
INTRAMUSCULAR | Status: DC | PRN
  Administered 2019-08-10 (×2): 25 ug via INTRAVENOUS

## 2019-08-10 MED ORDER — KETOROLAC TROMETHAMINE 15 MG/ML IJ SOLN
7.5000 mg | Freq: Four times a day (QID) | INTRAMUSCULAR | Status: DC
  Administered 2019-08-10 – 2019-08-11 (×3): 7.5 mg via INTRAVENOUS
  Filled 2019-08-10 (×3): qty 1

## 2019-08-10 MED ORDER — POVIDONE-IODINE 10 % EX SWAB
2.0000 "application " | Freq: Once | CUTANEOUS | Status: AC
  Administered 2019-08-10: 2 via TOPICAL

## 2019-08-10 MED ORDER — ACETAMINOPHEN 500 MG PO TABS
500.0000 mg | ORAL_TABLET | Freq: Four times a day (QID) | ORAL | Status: DC
  Administered 2019-08-10 – 2019-08-11 (×2): 500 mg via ORAL
  Filled 2019-08-10 (×3): qty 1

## 2019-08-10 MED ORDER — DIPHENHYDRAMINE HCL 12.5 MG/5ML PO ELIX: 12.5000 mg | ORAL_SOLUTION | ORAL | Status: DC | PRN

## 2019-08-10 MED ORDER — DOCUSATE SODIUM 100 MG PO CAPS
100.0000 mg | ORAL_CAPSULE | Freq: Two times a day (BID) | ORAL | Status: DC
  Administered 2019-08-10 – 2019-08-11 (×2): 100 mg via ORAL
  Filled 2019-08-10 (×2): qty 1

## 2019-08-10 MED ORDER — PROPOFOL 500 MG/50ML IV EMUL
INTRAVENOUS | Status: AC
  Filled 2019-08-10: qty 50

## 2019-08-10 MED ORDER — FENTANYL CITRATE (PF) 100 MCG/2ML IJ SOLN
50.0000 ug | Freq: Once | INTRAMUSCULAR | Status: AC
  Administered 2019-08-10: 50 ug via INTRAVENOUS
  Filled 2019-08-10: qty 2

## 2019-08-10 MED ORDER — MORPHINE SULFATE (PF) 2 MG/ML IV SOLN: 0.5000 mg | INTRAVENOUS | Status: DC | PRN

## 2019-08-10 MED ORDER — TRANEXAMIC ACID 1000 MG/10ML IV SOLN
INTRAVENOUS | Status: DC | PRN
  Administered 2019-08-10: 15:00:00 2000 mg via TOPICAL

## 2019-08-10 MED ORDER — LIDOCAINE 2% (20 MG/ML) 5 ML SYRINGE
INTRAMUSCULAR | Status: AC
  Filled 2019-08-10: qty 5

## 2019-08-10 MED ORDER — BUPIVACAINE-EPINEPHRINE 0.5% -1:200000 IJ SOLN
INTRAMUSCULAR | Status: DC | PRN
  Administered 2019-08-10: 30 mL

## 2019-08-10 MED ORDER — GLIPIZIDE ER 2.5 MG PO TB24
2.5000 mg | ORAL_TABLET | Freq: Every day | ORAL | Status: DC
  Administered 2019-08-11: 2.5 mg via ORAL
  Filled 2019-08-10: qty 1

## 2019-08-10 MED ORDER — ONDANSETRON HCL 4 MG/2ML IJ SOLN
INTRAMUSCULAR | Status: DC | PRN
  Administered 2019-08-10: 4 mg via INTRAVENOUS

## 2019-08-10 MED ORDER — TRANEXAMIC ACID-NACL 1000-0.7 MG/100ML-% IV SOLN
1000.0000 mg | INTRAVENOUS | Status: AC
  Administered 2019-08-10: 1000 mg via INTRAVENOUS
  Filled 2019-08-10: qty 100

## 2019-08-10 MED ORDER — MIDAZOLAM HCL 5 MG/5ML IJ SOLN
INTRAMUSCULAR | Status: DC | PRN
  Administered 2019-08-10 (×2): 1 mg via INTRAVENOUS

## 2019-08-10 MED ORDER — PROPOFOL 500 MG/50ML IV EMUL
INTRAVENOUS | Status: DC | PRN
  Administered 2019-08-10: 25 ug/kg/min via INTRAVENOUS

## 2019-08-10 MED ORDER — LACTATED RINGERS IV SOLN: INTRAVENOUS | Status: DC

## 2019-08-10 MED ORDER — METHOCARBAMOL 500 MG PO TABS
500.0000 mg | ORAL_TABLET | Freq: Four times a day (QID) | ORAL | Status: DC | PRN
  Administered 2019-08-10: 500 mg via ORAL
  Filled 2019-08-10: qty 1

## 2019-08-10 MED ORDER — METOPROLOL SUCCINATE ER 25 MG PO TB24
25.0000 mg | ORAL_TABLET | Freq: Every day | ORAL | Status: DC
  Administered 2019-08-11: 25 mg via ORAL
  Filled 2019-08-10: qty 1

## 2019-08-10 MED ORDER — PHENYLEPHRINE HCL-NACL 10-0.9 MG/250ML-% IV SOLN
INTRAVENOUS | Status: DC | PRN
  Administered 2019-08-10: 25 ug/min via INTRAVENOUS

## 2019-08-10 MED ORDER — CANAGLIFLOZIN 100 MG PO TABS
100.0000 mg | ORAL_TABLET | Freq: Every day | ORAL | Status: DC
  Administered 2019-08-11: 100 mg via ORAL
  Filled 2019-08-10: qty 1

## 2019-08-10 MED ORDER — ROPIVACAINE HCL 7.5 MG/ML IJ SOLN
INTRAMUSCULAR | Status: DC | PRN
  Administered 2019-08-10: 20 mL via PERINEURAL

## 2019-08-10 MED ORDER — LIDOCAINE HCL (CARDIAC) PF 100 MG/5ML IV SOSY
PREFILLED_SYRINGE | INTRAVENOUS | Status: DC | PRN
  Administered 2019-08-10: 40 mg via INTRAVENOUS

## 2019-08-10 MED ORDER — ALUM & MAG HYDROXIDE-SIMETH 200-200-20 MG/5ML PO SUSP: 30.0000 mL | ORAL | Status: DC | PRN

## 2019-08-10 MED ORDER — HYDROCODONE-ACETAMINOPHEN 7.5-325 MG PO TABS
1.0000 | ORAL_TABLET | ORAL | Status: DC | PRN
  Administered 2019-08-10 – 2019-08-11 (×2): 1 via ORAL
  Filled 2019-08-10 (×3): qty 1

## 2019-08-10 MED ORDER — ONDANSETRON HCL 4 MG/2ML IJ SOLN: 4.0000 mg | Freq: Four times a day (QID) | INTRAMUSCULAR | Status: DC | PRN

## 2019-08-10 MED ORDER — MIDAZOLAM HCL 2 MG/2ML IJ SOLN
1.0000 mg | Freq: Once | INTRAMUSCULAR | Status: AC
  Administered 2019-08-10: 1 mg via INTRAVENOUS
  Filled 2019-08-10: qty 2

## 2019-08-10 MED ORDER — FENTANYL CITRATE (PF) 100 MCG/2ML IJ SOLN: 25.0000 ug | INTRAMUSCULAR | Status: DC | PRN

## 2019-08-10 MED ORDER — ONDANSETRON HCL 4 MG/2ML IJ SOLN: 4.0000 mg | Freq: Once | INTRAMUSCULAR | Status: DC | PRN

## 2019-08-10 MED ORDER — METOCLOPRAMIDE HCL 5 MG PO TABS: 5.0000 mg | ORAL_TABLET | Freq: Three times a day (TID) | ORAL | Status: DC | PRN

## 2019-08-10 MED ORDER — METFORMIN HCL ER 500 MG PO TB24
500.0000 mg | ORAL_TABLET | Freq: Every day | ORAL | Status: DC
  Administered 2019-08-11: 500 mg via ORAL
  Filled 2019-08-10: qty 1

## 2019-08-10 MED ORDER — LEVOTHYROXINE SODIUM 75 MCG PO TABS
75.0000 ug | ORAL_TABLET | Freq: Every day | ORAL | Status: DC
  Administered 2019-08-11: 75 ug via ORAL
  Filled 2019-08-10: qty 1

## 2019-08-10 MED ORDER — FENTANYL CITRATE (PF) 100 MCG/2ML IJ SOLN
INTRAMUSCULAR | Status: AC
  Filled 2019-08-10: qty 2

## 2019-08-10 MED ORDER — HYDROCODONE-ACETAMINOPHEN 5-325 MG PO TABS
1.0000 | ORAL_TABLET | ORAL | Status: DC | PRN
  Administered 2019-08-10: 1 via ORAL
  Filled 2019-08-10: qty 1

## 2019-08-10 MED ORDER — BUPIVACAINE-EPINEPHRINE (PF) 0.5% -1:200000 IJ SOLN
INTRAMUSCULAR | Status: AC
  Filled 2019-08-10: qty 30

## 2019-08-10 MED ORDER — METOCLOPRAMIDE HCL 5 MG/ML IJ SOLN: 5.0000 mg | Freq: Three times a day (TID) | INTRAMUSCULAR | Status: DC | PRN

## 2019-08-10 MED ORDER — MINOXIDIL 10 MG PO TABS
10.0000 mg | ORAL_TABLET | Freq: Every day | ORAL | Status: DC
  Administered 2019-08-11: 10 mg via ORAL
  Filled 2019-08-10: qty 1

## 2019-08-10 MED ORDER — ONDANSETRON HCL 4 MG/2ML IJ SOLN
INTRAMUSCULAR | Status: AC
  Filled 2019-08-10: qty 2

## 2019-08-10 MED ORDER — 0.9 % SODIUM CHLORIDE (POUR BTL) OPTIME
TOPICAL | Status: DC | PRN
  Administered 2019-08-10: 14:00:00 1000 mL

## 2019-08-10 MED ORDER — ONDANSETRON HCL 4 MG PO TABS: 4.0000 mg | ORAL_TABLET | Freq: Four times a day (QID) | ORAL | Status: DC | PRN

## 2019-08-10 MED ORDER — ASPIRIN 81 MG PO CHEW
81.0000 mg | CHEWABLE_TABLET | Freq: Two times a day (BID) | ORAL | Status: DC
  Administered 2019-08-11: 81 mg via ORAL
  Filled 2019-08-10: qty 1

## 2019-08-10 MED ORDER — OXYCODONE HCL 5 MG/5ML PO SOLN: 5.0000 mg | Freq: Once | ORAL | Status: DC | PRN

## 2019-08-10 MED ORDER — PHENYLEPHRINE 40 MCG/ML (10ML) SYRINGE FOR IV PUSH (FOR BLOOD PRESSURE SUPPORT)
PREFILLED_SYRINGE | INTRAVENOUS | Status: DC | PRN
  Administered 2019-08-10: 80 ug via INTRAVENOUS
  Administered 2019-08-10: 60 ug via INTRAVENOUS
  Administered 2019-08-10: 120 ug via INTRAVENOUS

## 2019-08-10 MED ORDER — BISACODYL 5 MG PO TBEC: 5.0000 mg | DELAYED_RELEASE_TABLET | Freq: Every day | ORAL | Status: DC | PRN

## 2019-08-10 MED ORDER — SODIUM CHLORIDE (PF) 0.9 % IJ SOLN
INTRAMUSCULAR | Status: DC | PRN
  Administered 2019-08-10: 30 mL

## 2019-08-10 MED ORDER — SODIUM CHLORIDE (PF) 0.9 % IJ SOLN
INTRAMUSCULAR | Status: AC
  Filled 2019-08-10: qty 50

## 2019-08-10 MED ORDER — PANTOPRAZOLE SODIUM 40 MG PO TBEC
80.0000 mg | DELAYED_RELEASE_TABLET | Freq: Every day | ORAL | Status: DC
  Administered 2019-08-11: 80 mg via ORAL
  Filled 2019-08-10: qty 2

## 2019-08-10 MED ORDER — MIDAZOLAM HCL 2 MG/2ML IJ SOLN
INTRAMUSCULAR | Status: AC
  Filled 2019-08-10: qty 2

## 2019-08-10 MED ORDER — BUPIVACAINE LIPOSOME 1.3 % IJ SUSP
INTRAMUSCULAR | Status: DC | PRN
  Administered 2019-08-10: 20 mL

## 2019-08-10 MED ORDER — MINOXIDIL 2.5 MG PO TABS
5.0000 mg | ORAL_TABLET | Freq: Every day | ORAL | Status: DC
  Administered 2019-08-10: 5 mg via ORAL
  Filled 2019-08-10: qty 2

## 2019-08-10 MED ORDER — LATANOPROST 0.005 % OP SOLN
1.0000 [drp] | Freq: Every day | OPHTHALMIC | Status: DC
  Administered 2019-08-10: 1 [drp] via OPHTHALMIC
  Filled 2019-08-10: qty 2.5

## 2019-08-10 MED ORDER — ACETAMINOPHEN 325 MG PO TABS: 325.0000 mg | ORAL_TABLET | Freq: Four times a day (QID) | ORAL | Status: DC | PRN

## 2019-08-10 MED ORDER — FUROSEMIDE 20 MG PO TABS
20.0000 mg | ORAL_TABLET | Freq: Every day | ORAL | Status: DC
  Administered 2019-08-10 – 2019-08-11 (×2): 20 mg via ORAL
  Filled 2019-08-10 (×2): qty 1

## 2019-08-10 MED ORDER — PHENOL 1.4 % MT LIQD: 1.0000 | OROMUCOSAL | Status: DC | PRN

## 2019-08-10 MED ORDER — VERAPAMIL HCL ER 240 MG PO TBCR
240.0000 mg | EXTENDED_RELEASE_TABLET | Freq: Every day | ORAL | Status: DC
  Administered 2019-08-10 – 2019-08-11 (×2): 240 mg via ORAL
  Filled 2019-08-10 (×2): qty 1

## 2019-08-10 MED ORDER — DEXAMETHASONE SODIUM PHOSPHATE 10 MG/ML IJ SOLN
INTRAMUSCULAR | Status: AC
  Filled 2019-08-10: qty 1

## 2019-08-10 MED ORDER — DEXAMETHASONE SODIUM PHOSPHATE 4 MG/ML IJ SOLN
INTRAMUSCULAR | Status: DC | PRN
  Administered 2019-08-10: 4 mg via INTRAVENOUS

## 2019-08-10 MED ORDER — CEFAZOLIN SODIUM-DEXTROSE 2-4 GM/100ML-% IV SOLN
2.0000 g | Freq: Four times a day (QID) | INTRAVENOUS | Status: AC
  Administered 2019-08-10 – 2019-08-11 (×2): 2 g via INTRAVENOUS
  Filled 2019-08-10 (×2): qty 100

## 2019-08-10 MED ORDER — LOSARTAN POTASSIUM 50 MG PO TABS
100.0000 mg | ORAL_TABLET | Freq: Every day | ORAL | Status: DC
  Administered 2019-08-10 – 2019-08-11 (×2): 100 mg via ORAL
  Filled 2019-08-10 (×2): qty 2

## 2019-08-10 MED ORDER — OXYCODONE HCL 5 MG PO TABS: 5.0000 mg | ORAL_TABLET | Freq: Once | ORAL | Status: DC | PRN

## 2019-08-10 MED ORDER — MENTHOL 3 MG MT LOZG: 1.0000 | LOZENGE | OROMUCOSAL | Status: DC | PRN

## 2019-08-10 SURGICAL SUPPLY — 52 items
ATTUNE MED DOME PAT 41 KNEE (Knees) ×1 IMPLANT
ATTUNE MED DOME PAT 41MM KNEE (Knees) ×1 IMPLANT
ATTUNE PS FEM RT SZ 8 CEM KNEE (Femur) ×2 IMPLANT
ATTUNE PSRP INSR SZ8 6 KNEE (Insert) ×1 IMPLANT
ATTUNE PSRP INSR SZ8 6MM KNEE (Insert) ×1 IMPLANT
BAG DECANTER FOR FLEXI CONT (MISCELLANEOUS) ×3 IMPLANT
BAG SPEC THK2 15X12 ZIP CLS (MISCELLANEOUS) ×1
BAG ZIPLOCK 12X15 (MISCELLANEOUS) ×3 IMPLANT
BASE TIBIAL ATTUNE KNEE SZ9 (Knees) IMPLANT
BLADE SAGITTAL 25.0X1.19X90 (BLADE) ×2 IMPLANT
BLADE SAGITTAL 25.0X1.19X90MM (BLADE) ×1
BLADE SAW SGTL 11.0X1.19X90.0M (BLADE) ×3 IMPLANT
BNDG ELASTIC 6X5.8 VLCR STR LF (GAUZE/BANDAGES/DRESSINGS) ×3 IMPLANT
BOOTIES KNEE HIGH SLOAN (MISCELLANEOUS) ×3 IMPLANT
BOWL SMART MIX CTS (DISPOSABLE) ×3 IMPLANT
BSPLAT TIB 9 CMNT ROT PLAT STR (Knees) ×1 IMPLANT
CEMENT HV SMART SET (Cement) ×6 IMPLANT
COVER SURGICAL LIGHT HANDLE (MISCELLANEOUS) ×3 IMPLANT
COVER WAND RF STERILE (DRAPES) ×3 IMPLANT
CUFF TOURN SGL QUICK 34 (TOURNIQUET CUFF) ×3
CUFF TRNQT CYL 34X4.125X (TOURNIQUET CUFF) ×1 IMPLANT
DECANTER SPIKE VIAL GLASS SM (MISCELLANEOUS) ×6 IMPLANT
DRAPE SHEET LG 3/4 BI-LAMINATE (DRAPES) ×3 IMPLANT
DRAPE TOP 10253 STERILE (DRAPES) ×3 IMPLANT
DRAPE U-SHAPE 47X51 STRL (DRAPES) ×3 IMPLANT
DRSG AQUACEL AG ADV 3.5X10 (GAUZE/BANDAGES/DRESSINGS) ×3 IMPLANT
DURAPREP 26ML APPLICATOR (WOUND CARE) ×6 IMPLANT
ELECT REM PT RETURN 15FT ADLT (MISCELLANEOUS) ×3 IMPLANT
GLOVE BIO SURGEON STRL SZ8 (GLOVE) ×6 IMPLANT
GLOVE BIOGEL PI IND STRL 8 (GLOVE) ×2 IMPLANT
GLOVE BIOGEL PI INDICATOR 8 (GLOVE) ×4
GOWN STRL REUS W/TWL XL LVL3 (GOWN DISPOSABLE) ×6 IMPLANT
HANDPIECE INTERPULSE COAX TIP (DISPOSABLE) ×3
HOLDER FOLEY CATH W/STRAP (MISCELLANEOUS) IMPLANT
HOOD PEEL AWAY FLYTE STAYCOOL (MISCELLANEOUS) ×9 IMPLANT
KIT TURNOVER KIT A (KITS) IMPLANT
MANIFOLD NEPTUNE II (INSTRUMENTS) ×3 IMPLANT
NS IRRIG 1000ML POUR BTL (IV SOLUTION) ×3 IMPLANT
PACK TOTAL KNEE CUSTOM (KITS) ×3 IMPLANT
PAD ARMBOARD 7.5X6 YLW CONV (MISCELLANEOUS) ×3 IMPLANT
PENCIL SMOKE EVACUATOR (MISCELLANEOUS) IMPLANT
PROTECTOR NERVE ULNAR (MISCELLANEOUS) ×3 IMPLANT
SET HNDPC FAN SPRY TIP SCT (DISPOSABLE) ×1 IMPLANT
SUT ETHIBOND NAB CT1 #1 30IN (SUTURE) ×6 IMPLANT
SUT VIC AB 0 CT1 36 (SUTURE) ×3 IMPLANT
SUT VIC AB 2-0 CT1 27 (SUTURE) ×3
SUT VIC AB 2-0 CT1 TAPERPNT 27 (SUTURE) ×1 IMPLANT
SUT VICRYL AB 3-0 FS1 BRD 27IN (SUTURE) ×3 IMPLANT
TIBIAL BASE ATTUNE KNEE SZ9 (Knees) ×3 IMPLANT
TRAY FOLEY MTR SLVR 16FR STAT (SET/KITS/TRAYS/PACK) IMPLANT
WATER STERILE IRR 1000ML POUR (IV SOLUTION) ×3 IMPLANT
WRAP KNEE MAXI GEL POST OP (GAUZE/BANDAGES/DRESSINGS) ×3 IMPLANT

## 2019-08-10 NOTE — Op Note (Signed)
PREOP DIAGNOSIS: DJD RIGHT KNEE POSTOP DIAGNOSIS: same PROCEDURE: RIGHT TKR ANESTHESIA: Spinal and MAC ATTENDING SURGEON: Hessie Dibble ASSISTANT: Loni Dolly PA  INDICATIONS FOR PROCEDURE: Matthew Keller is a 69 y.o. male who has struggled for a long time with pain due to degenerative arthritis of the right knee.  The patient has failed many conservative non-operative measures and at this point has pain which limits the ability to sleep and walk.  The patient is offered total knee replacement.  Informed operative consent was obtained after discussion of possible risks of anesthesia, infection, neurovascular injury, DVT, and death.  The importance of the post-operative rehabilitation protocol to optimize result was stressed extensively with the patient.  SUMMARY OF FINDINGS AND PROCEDURE:  Matthew Keller was taken to the operative suite where under the above anesthesia a right knee replacement was performed.  There were advanced degenerative changes and the bone quality was excellent.  We used the DePuy Attune system and placed size 8 femur, 9 tibia, 41 mm all polyethylene patella, and a size 6 mm spacer.  Loni Dolly PA-C assisted throughout and was invaluable to the completion of the case in that he helped retract and maintain exposure while I placed components.  He also helped close thereby minimizing OR time.  The patient was admitted for appropriate post-op care to include perioperative antibiotics and mechanical and pharmacologic measures for DVT prophylaxis.  DESCRIPTION OF PROCEDURE:  Matthew Keller was taken to the operative suite where the above anesthesia was applied.  The patient was positioned supine and prepped and draped in normal sterile fashion.  An appropriate time out was performed.  After the administration of kefzol pre-op antibiotic the leg was elevated and exsanguinated and a tourniquet inflated. A standard longitudinal incision was made on the anterior knee.  Dissection was  carried down to the extensor mechanism.  All appropriate anti-infective measures were used including the pre-operative antibiotic, betadine impregnated drape, and closed hooded exhaust systems for each member of the surgical team.  A medial parapatellar incision was made in the extensor mechanism and the knee cap flipped and the knee flexed.  Some residual meniscal tissues were removed along with any remaining ACL/PCL tissue.  A guide was placed on the tibia and a flat cut was made on it's superior surface.  An intramedullary guide was placed in the femur and was utilized to make anterior and posterior cuts creating an appropriate flexion gap.  A second intramedullary guide was placed in the femur to make a distal cut properly balancing the knee with an extension gap equal to the flexion gap.  The three bones sized to the above mentioned sizes and the appropriate guides were placed and utilized.  A trial reduction was done and the knee easily came to full extension and the patella tracked well on flexion.  The trial components were removed and all bones were cleaned with pulsatile lavage and then dried thoroughly.  Cement was mixed and was pressurized onto the bones followed by placement of the aforementioned components.  Excess cement was trimmed and pressure was held on the components until the cement had hardened.  The tourniquet was deflated and a small amount of bleeding was controlled with cautery and pressure.  The knee was irrigated thoroughly.  The extensor mechanism was re-approximated with #1 ethibond in interrupted fashion.  The knee was flexed and the repair was solid.  The subcutaneous tissues were re-approximated with #0 and #2-0 vicryl and the skin closed with  a subcuticular stitch and steristrips.  A sterile dressing was applied.  Intraoperative fluids, EBL, and tourniquet time can be obtained from anesthesia records.  DISPOSITION:  The patient was taken to recovery room in stable condition and  admitted for appropriate post-op care to include peri-operative antibiotic and DVT prophylaxis with mechanical and pharmacologic measures.  Matthew Keller 08/10/2019, 3:13 PM

## 2019-08-10 NOTE — Anesthesia Procedure Notes (Signed)
Anesthesia Regional Block: Adductor canal block   Pre-Anesthetic Checklist: ,, timeout performed, Correct Patient, Correct Site, Correct Laterality, Correct Procedure, Correct Position, site marked, Risks and benefits discussed,  Surgical consent,  Pre-op evaluation,  At surgeon's request and post-op pain management  Laterality: Right  Prep: chloraprep       Needles:  Injection technique: Single-shot  Needle Type: Echogenic Needle     Needle Length: 10cm  Needle Gauge: 21     Additional Needles:   Narrative:  Start time: 08/10/2019 11:06 AM End time: 08/10/2019 11:09 AM Injection made incrementally with aspirations every 5 mL.  Performed by: Personally  Anesthesiologist: Audry Pili, MD  Additional Notes: No pain on injection. No increased resistance to injection. Injection made in 5cc increments. Good needle visualization. Patient tolerated the procedure well.

## 2019-08-10 NOTE — Anesthesia Preprocedure Evaluation (Addendum)
Anesthesia Evaluation  Patient identified by MRN, date of birth, ID band Patient awake    Reviewed: Allergy & Precautions, NPO status , Patient's Chart, lab work & pertinent test results, reviewed documented beta blocker date and time   History of Anesthesia Complications Negative for: history of anesthetic complications  Airway Mallampati: II  TM Distance: >3 FB Neck ROM: Full    Dental  (+) Dental Advisory Given, Partial Upper, Partial Lower   Pulmonary sleep apnea and Continuous Positive Airway Pressure Ventilation , former smoker,    Pulmonary exam normal        Cardiovascular hypertension, Pt. on home beta blockers and Pt. on medications + CAD and + Peripheral Vascular Disease  Normal cardiovascular exam   '18 Myoperfusion - Nuclear stress EF: 53%. EF 45-54%. Defect 1: There is a small defect of mild severity present in the mid inferior and apical inferior location. Findings consistent with ischemia. This is a low risk study.  Cardiac note from 07/20/19 Levada Dy Duke, PA) -  He has a history of mild nonobstructive CAD. He is diabetic, but does not take insulin. He has CKD but no history of sCr above 2.0. He is able to complete more than 4.0 METS without anginal symptoms. According to the RCRI, he is at acceptable risk for the planned procedure. Given his disease and DM, he knows he is at risk for complications and wishes to proceed with surgery. No further cardiac workup indicated prior to surgery.    Neuro/Psych negative neurological ROS  negative psych ROS   GI/Hepatic Neg liver ROS, GERD  Medicated and Controlled,  Endo/Other  diabetes, Type 2, Oral Hypoglycemic AgentsHypothyroidism  Obesity   Renal/GU CRFRenal disease     Musculoskeletal negative musculoskeletal ROS (+)   Abdominal   Peds  Hematology  Plt 232k    Anesthesia Other Findings Covid neg 4/30  Reproductive/Obstetrics                             Anesthesia Physical Anesthesia Plan  ASA: III  Anesthesia Plan: Spinal   Post-op Pain Management:  Regional for Post-op pain   Induction:   PONV Risk Score and Plan: 1 and Treatment may vary due to age or medical condition and Propofol infusion  Airway Management Planned: Natural Airway and Simple Face Mask  Additional Equipment: None  Intra-op Plan:   Post-operative Plan:   Informed Consent: I have reviewed the patients History and Physical, chart, labs and discussed the procedure including the risks, benefits and alternatives for the proposed anesthesia with the patient or authorized representative who has indicated his/her understanding and acceptance.       Plan Discussed with: CRNA and Anesthesiologist  Anesthesia Plan Comments: (Labs reviewed, platelets acceptable. Discussed risks and benefits of spinal, including spinal/epidural hematoma, infection, failed block, and PDPH. Patient expressed understanding and wished to proceed. )       Anesthesia Quick Evaluation

## 2019-08-10 NOTE — Transfer of Care (Signed)
Immediate Anesthesia Transfer of Care Note  Patient: Matthew Keller  Procedure(s) Performed: RIGHT TOTAL KNEE ARTHROPLASTY (Right Knee)  Patient Location: PACU  Anesthesia Type:MAC and Spinal  Level of Consciousness: awake, drowsy, patient cooperative and responds to stimulation  Airway & Oxygen Therapy: Patient Spontanous Breathing and Patient connected to face mask oxygen  Post-op Assessment: Report given to RN and Post -op Vital signs reviewed and stable  Post vital signs: Reviewed and stable  Last Vitals:  Vitals Value Taken Time  BP 120/80 08/10/19 1538  Temp 36.6 C 08/10/19 1538  Pulse 70 08/10/19 1543  Resp 16 08/10/19 1543  SpO2 100 % 08/10/19 1543  Vitals shown include unvalidated device data.  Last Pain:  Vitals:   08/10/19 1110  TempSrc:   PainSc: 0-No pain         Complications: No apparent anesthesia complications

## 2019-08-10 NOTE — Interval H&P Note (Signed)
History and Physical Interval Note:  08/10/2019 12:14 PM  Matthew Keller  has presented today for surgery, with the diagnosis of RIGHT KNEE DEGENERATIVE JOINT DISEASE.  The various methods of treatment have been discussed with the patient and family. After consideration of risks, benefits and other options for treatment, the patient has consented to  Procedure(s): RIGHT TOTAL KNEE ARTHROPLASTY (Right) as a surgical intervention.  The patient's history has been reviewed, patient examined, no change in status, stable for surgery.  I have reviewed the patient's chart and labs.  Questions were answered to the patient's satisfaction.     Hessie Dibble

## 2019-08-10 NOTE — Plan of Care (Signed)
Plan of care 

## 2019-08-10 NOTE — Progress Notes (Signed)
Assisted Dr. Brock with right, ultrasound guided, adductor canal block. Side rails up, monitors on throughout procedure. See vital signs in flow sheet. Tolerated Procedure well.  

## 2019-08-10 NOTE — Anesthesia Procedure Notes (Signed)
Spinal  Patient location during procedure: OR Start time: 08/10/2019 1:21 PM End time: 08/10/2019 1:27 PM Staffing Performed: resident/CRNA  Resident/CRNA: Garrel Ridgel, CRNA Preanesthetic Checklist Completed: patient identified, IV checked, site marked, risks and benefits discussed, surgical consent, monitors and equipment checked, pre-op evaluation and timeout performed Spinal Block Patient position: sitting Prep: DuraPrep Patient monitoring: heart rate, cardiac monitor, continuous pulse ox and blood pressure Approach: midline Location: L3-4 Injection technique: single-shot Needle Needle type: Sprotte and Pencan  Needle gauge: 24 G Needle length: 9 cm Needle insertion depth: 8 cm Assessment Sensory level: T4

## 2019-08-11 ENCOUNTER — Other Ambulatory Visit: Payer: Self-pay | Admitting: Internal Medicine

## 2019-08-11 DIAGNOSIS — M1711 Unilateral primary osteoarthritis, right knee: Secondary | ICD-10-CM | POA: Diagnosis not present

## 2019-08-11 DIAGNOSIS — N183 Chronic kidney disease, stage 3 unspecified: Secondary | ICD-10-CM | POA: Diagnosis not present

## 2019-08-11 DIAGNOSIS — I2510 Atherosclerotic heart disease of native coronary artery without angina pectoris: Secondary | ICD-10-CM | POA: Diagnosis not present

## 2019-08-11 DIAGNOSIS — E1151 Type 2 diabetes mellitus with diabetic peripheral angiopathy without gangrene: Secondary | ICD-10-CM | POA: Diagnosis not present

## 2019-08-11 DIAGNOSIS — E785 Hyperlipidemia, unspecified: Secondary | ICD-10-CM | POA: Diagnosis not present

## 2019-08-11 DIAGNOSIS — Z96651 Presence of right artificial knee joint: Secondary | ICD-10-CM | POA: Diagnosis not present

## 2019-08-11 DIAGNOSIS — E1165 Type 2 diabetes mellitus with hyperglycemia: Secondary | ICD-10-CM | POA: Diagnosis not present

## 2019-08-11 DIAGNOSIS — E1122 Type 2 diabetes mellitus with diabetic chronic kidney disease: Secondary | ICD-10-CM | POA: Diagnosis not present

## 2019-08-11 DIAGNOSIS — G4733 Obstructive sleep apnea (adult) (pediatric): Secondary | ICD-10-CM | POA: Diagnosis not present

## 2019-08-11 DIAGNOSIS — I129 Hypertensive chronic kidney disease with stage 1 through stage 4 chronic kidney disease, or unspecified chronic kidney disease: Secondary | ICD-10-CM | POA: Diagnosis not present

## 2019-08-11 MED ORDER — TIZANIDINE HCL 4 MG PO TABS
4.0000 mg | ORAL_TABLET | Freq: Four times a day (QID) | ORAL | 1 refills | Status: AC | PRN
Start: 2019-08-11 — End: 2020-08-10

## 2019-08-11 MED ORDER — ASPIRIN 81 MG PO CHEW: 81.0000 mg | CHEWABLE_TABLET | Freq: Two times a day (BID) | ORAL | 0 refills | Status: AC

## 2019-08-11 MED ORDER — HYDROCODONE-ACETAMINOPHEN 5-325 MG PO TABS: 1.0000 | ORAL_TABLET | Freq: Four times a day (QID) | ORAL | 0 refills | Status: DC | PRN

## 2019-08-11 NOTE — Progress Notes (Signed)
Subjective: 1 Day Post-Op Procedure(s) (LRB): RIGHT TOTAL KNEE ARTHROPLASTY (Right)   Patient doing well and hoping to go home today.  Activity level:  wbat Diet tolerance:  ok Voiding:  ok Patient reports pain as mild.    Objective: Vital signs in last 24 hours: Temp:  [97.5 F (36.4 C)-98.9 F (37.2 C)] 98 F (36.7 C) (05/05 0647) Pulse Rate:  [55-79] 72 (05/05 0647) Resp:  [11-20] 14 (05/05 0647) BP: (120-186)/(80-95) 151/89 (05/05 0647) SpO2:  [96 %-100 %] 100 % (05/05 0647) Weight:  [124.3 kg-126 kg] 126 kg (05/04 1758)  Labs: No results for input(s): HGB in the last 72 hours. No results for input(s): WBC, RBC, HCT, PLT in the last 72 hours. No results for input(s): NA, K, CL, CO2, BUN, CREATININE, GLUCOSE, CALCIUM in the last 72 hours. No results for input(s): LABPT, INR in the last 72 hours.  Physical Exam:  Neurologically intact ABD soft Neurovascular intact Sensation intact distally Intact pulses distally Dorsiflexion/Plantar flexion intact Incision: dressing C/D/I and no drainage No cellulitis present Compartment soft  Assessment/Plan:  1 Day Post-Op Procedure(s) (LRB): RIGHT TOTAL KNEE ARTHROPLASTY (Right) Advance diet Up with therapy D/C IV fluids Discharge home with home health today after PT. Continue on 81mg  asa BID x 2 weeks post op. Follow up in office 2 weeks post op.    Larwance Sachs Letita Prentiss 08/11/2019, 7:49 AM

## 2019-08-11 NOTE — Evaluation (Addendum)
Physical Therapy Evaluation Patient Details Name: Matthew Keller MRN: [id number] DOB: [birth year] Today's Date: 08/11/2019   History of Present Illness  Matthew Keller is an 69 y.o. male who was admitted for R TKA on 08/10/19.  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 Pt overall requiring SUPV with mobility, good follow through with stair training cues and able to perform with steady balance. Pt demonstrates good safety awareness, balance and strength when ambulating to restroom and in hallway. Pt previously very active in personal strengthening program, benefiting him now in recovery. Discussed signs of DVT with pt and spouse and both verbalized understanding. Pt will benefit from skilled PT to increase their independence and safety with mobility to allow discharge to the venue listed below.      Follow Up Recommendations Follow surgeon's recommendation for DC plan and follow-up therapies    Equipment Recommendations  Rolling walker with 5" wheels;3in1 (PT)    Recommendations for Other Services       Precautions / Restrictions Precautions Precautions: None Restrictions Weight Bearing Restrictions: No Other Position/Activity Restrictions: WBAT      Mobility  Bed Mobility Overal bed mobility: Modified Independent  General bed mobility comments: increased time to come to EOB  Transfers Overall transfer level: Needs assistance Equipment used: Rolling walker (2 wheeled) Transfers: Sit to/from [employer] Transfers Sit to Stand: Supervision Stand pivot transfers: Supervision  General transfer comment: cues to push through BUE to power up, RLE slightly extended for comfort with rising and lowering  Ambulation/Gait Ambulation/Gait assistance: Supervision Gait Distance (Feet): 180 Feet Assistive device: Rolling walker (2 wheeled) Gait Pattern/deviations: Step-through pattern Gait velocity: WNL   General Gait  Details: step through gait pattern, decreased R knee flexion in swing, increased BUE weight-bearing on RW with L step progressing to more fluid pattern, no loss of balance or unsteadiness  Stairs Stairs: Yes Stairs assistance: Supervision Stair Management: One rail Left;Two rails Number of Stairs: 10 General stair comments: 6 steps with bil handrail, 4 steps with L handrail to ascend, cues for "good leg leading up and bad leg leading down" able to maintain pattern with good balance  Wheelchair Mobility    Modified Rankin (Stroke Patients Only)       Balance Overall balance assessment: Needs assistance Sitting-balance support: Feet supported;No upper extremity supported Sitting balance-Leahy Scale: Good Sitting balance - Comments: seated EOB   Standing balance support: During functional activity;Bilateral upper extremity supported Standing balance-Leahy Scale: Good Standing balance comment: with RW                Pertinent Vitals/Pain Pain Assessment: No/denies pain    Home Living Family/patient expects to be discharged to:: Private residence Living Arrangements: Spouse/significant other Available Help at Discharge: Family;Available 24 hours/day Type of Home: House Home Access: Stairs to enter   [employer] of Steps: 8 steps with L handrail, 4 steps with bil handrail Home Layout: One level Home Equipment: Cane - single point      Prior Function Level of Independence: Independent         Comments: reports independent with ADLs, community ambulator without AD, 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)     Hand Dominance   Dominant Hand: Right    Extremity/Trunk Assessment   Upper Extremity Assessment Upper Extremity Assessment: Overall WFL for tasks assessed    Lower Extremity Assessment Lower Extremity Assessment: Overall WFL for tasks assessed(LLE AROM WNL,  strength 5/5) RLE Deficits / Details: denies numbness/tingling, AROM WNL,  hip/ankle strength 5/5, knee 4/5    Cervical / Trunk Assessment Cervical / Trunk Assessment: Normal  Communication   Communication: No difficulties  Cognition Arousal/Alertness: Awake/alert Behavior During Therapy: WFL for tasks assessed/performed Overall Cognitive Status: Within Functional Limits for tasks assessed             General Comments      Exercises Total Joint Exercises Ankle Circles/Pumps: Supine;Both;20 reps   Assessment/Plan    PT Assessment All further PT needs can be met in the next venue of care  PT Problem List Decreased strength;Decreased range of motion       PT Treatment Interventions      PT Goals (Current goals can be found in the Care Plan section)  Acute Rehab PT Goals Patient Stated Goal: go home today PT Goal Formulation: With patient Time For Goal Achievement: 08/11/19 Potential to Achieve Goals: Good    Frequency     Barriers to discharge        Co-evaluation               AM-PAC PT "6 Clicks" Mobility  Outcome Measure Help needed turning from your back to your side while in a flat bed without using bedrails?: None Help needed moving from lying on your back to sitting on the side of a flat bed without using bedrails?: None Help needed moving to and from a bed to a chair (including a wheelchair)?: None Help needed standing up from a chair using your arms (e.g., wheelchair or bedside chair)?: A Little Help needed to walk in hospital room?: A Little Help needed climbing 3-5 steps with a railing? : A Little 6 Click Score: 21    End of Session Equipment Utilized During Treatment: Gait belt Activity Tolerance: Patient tolerated treatment well Patient left: in chair;with call bell/phone within reach;with family/visitor present Nurse Communication: Mobility status PT Visit Diagnosis: Other abnormalities of gait and mobility (R26.89)    Time: 1962-2297 PT Time Calculation (min) (ACUTE ONLY): 35 min   Charges:   PT  Evaluation $PT Eval Low Complexity: 1 Low          Tori Jaqlyn Gruenhagen PT, DPT 08/11/19, 11:49 AM [phone]

## 2019-08-11 NOTE — Discharge Summary (Signed)
Patient ID: Matthew Keller MRN: [id number] DOB/AGE: 01/04/1951 69 y.o.  Admit date: 08/10/2019 Discharge date: 08/11/2019  Admission Diagnoses:  Principal Problem:   Primary osteoarthritis of right knee   Discharge Diagnoses:  Same  Past Medical History:  Diagnosis Date  . Atypical chest pain    [facility] 08-03-01 nl coronaries and lv fn, lvedp 30.  cardiac workup by Dr. Harrington Challenger 5/09 neg cardiolite for ischemia, rec rsik reduction  . Chicken pox   . Diverticulosis    colonoscopy 9/01........Marland KitchenDr. Deatra Ina  . DM (diabetes mellitus) ([place])   . GERD (gastroesophageal reflux disease)   . Healthcare maintenance    pneumovax 09/2006, Td 04/2004, CPX June 12, 2010  . Hypertension   . Microcytic anemia   . Morbid obesity ([place])    all time high 310 2009.  target wt = 208 for BMI <30.  referred back to nutrition again June 12, 2010  . 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 Renal insufficiency    baseline 1.6 October 24, 2009 > 1.3 June 12, 2010  . Sleep apnea   . Thyroid disease     Surgeries: Procedure(s): RIGHT TOTAL KNEE ARTHROPLASTY on 08/10/2019   Consultants:   Discharged Condition: Improved  Hospital Course: Matthew Keller is an 69 y.o. male who was admitted 08/10/2019 for operative treatment ofPrimary osteoarthritis of right knee. Patient has severe unremitting pain that affects sleep, daily activities, and work/hobbies. After pre-op clearance the patient was taken to the operating room on 08/10/2019 and underwent  Procedure(s): RIGHT TOTAL KNEE ARTHROPLASTY.    Patient was given perioperative antibiotics:  Anti-infectives (From admission, onward)   Start     Dose/Rate Route Frequency Ordered Stop   08/10/19 2000  ceFAZolin (ANCEF) IVPB 2g/100 mL premix     2 g 200 mL/hr over 30 Minutes Intravenous Every 6 hours 08/10/19 1804 08/11/19 0226   08/10/19 0600  ceFAZolin (ANCEF) 3 g in dextrose 5 % 50 mL IVPB     3 g 100  mL/hr over 30 Minutes Intravenous On call to O.R. 08/09/19 0710 08/10/19 1331       Patient was given sequential compression devices, early ambulation, and chemoprophylaxis to prevent DVT.  Patient benefited maximally from hospital stay and there were no complications.    Recent vital signs:  Patient Vitals for the past 24 hrs:  BP Temp Temp src Pulse Resp SpO2 Height Weight  08/11/19 0647 (!) 151/89 98 F (36.7 C) -- 72 14 100 % -- --  08/11/19 0132 (!) 152/80 98 F (36.7 C) -- 79 14 99 % -- --  08/10/19 2114 (!) 167/85 97.7 F (36.5 C) -- 75 14 100 % -- --  08/10/19 2109 -- -- -- 68 15 96 % -- --  08/10/19 2003 (!) 186/85 97.7 F (36.5 C) Oral 68 14 100 % -- --  08/10/19 1922 (!) 180/91 (!) 97.5 F (36.4 C) -- 65 15 100 % -- --  08/10/19 1758 (!) 164/90 97.7 F (36.5 C) Oral (!) 59 20 100 % 5' 11"  (1.803 m) 126 kg  08/10/19 1730 (!) 163/91 97.6 F (36.4 C) -- -- 13 100 % -- --  08/10/19 1630 (!) 150/80 -- -- (!) 55 12 100 % -- --  08/10/19 1615 (!) 156/89 -- -- 61 13 100 % -- --  08/10/19 1600 (!) 144/83 -- -- 64 12 100 % -- --  08/10/19 1545 (!)  156/92 -- -- 61 12 100 % -- --  08/10/19 1538 120/80 97.8 F (36.6 C) -- 63 12 100 % -- --  08/10/19 1200 (!) 159/94 -- -- 65 13 100 % -- --  08/10/19 1145 (!) 153/91 -- -- 70 12 100 % -- --  08/10/19 1130 (!) 156/89 -- -- 70 13 100 % -- --  08/10/19 1110 (!) 170/95 -- -- 72 11 98 % -- --  08/10/19 1104 (!) 172/94 -- -- -- -- -- -- --  08/10/19 1012 (!) 171/94 98.9 F (37.2 C) Oral 75 18 98 % 5' 11"  (1.803 m) 124.3 kg     Recent laboratory studies: No results for input(s): WBC, HGB, HCT, PLT, NA, K, CL, CO2, BUN, CREATININE, GLUCOSE, INR, CALCIUM in the last 72 hours.  Invalid input(s): PT, 2   Discharge Medications:   Allergies as of 08/11/2019   No Known Allergies     Medication List    TAKE these medications   acetaminophen 325 MG tablet Commonly known as: TYLENOL Take 650 mg by mouth every 6 (six) hours as needed  for moderate pain.   aspirin 81 MG chewable tablet Chew 1 tablet (81 mg total) by mouth 2 (two) times daily.   furosemide 20 MG tablet Commonly known as: LASIX Take 1 tablet (20 mg total) by mouth daily. Annual appt due in MAY must see provider for future refills What changed: additional instructions   glipiZIDE 2.5 MG 24 hr tablet Commonly known as: GLUCOTROL XL TAKE 1 TABLET (2.5 MG TOTAL) BY MOUTH DAILY WITH BREAKFAST.   glucose blood test strip Commonly known as: OneTouch Verio Use to check sugars 1-2 times per day   HYDROcodone-acetaminophen 5-325 MG tablet Commonly known as: NORCO/VICODIN Take 1-2 tablets by mouth every 6 (six) hours as needed for moderate pain or severe pain (post op pain).   Jardiance 25 MG Tabs tablet Generic drug: empagliflozin Take 25 mg by mouth daily. Annual appt due in May must see provider for future refills What changed: additional instructions   latanoprost 0.005 % ophthalmic solution Commonly known as: XALATAN Place 1 drop into both eyes at bedtime.   levothyroxine 75 MCG tablet Commonly known as: SYNTHROID TAKE 1 TABLET BY MOUTH EVERY DAY What changed: when to take this   losartan 100 MG tablet Commonly known as: COZAAR Take 1 tablet (100 mg total) by mouth daily. Annual appt due in May must see provider for future refills What changed: additional instructions   metFORMIN 500 MG 24 hr tablet Commonly known as: GLUCOPHAGE-XR TAKE 1 TABLET BY MOUTH EVERY DAY WITH BREAKFAST What changed: See the new instructions.   metoprolol succinate 25 MG 24 hr tablet Commonly known as: TOPROL-XL TAKE 1 TABLET BY MOUTH EVERY DAY   minoxidil 10 MG tablet Commonly known as: LONITEN Take 1 tablet in the morning and 1/2 tablet in the evening. Annual appt due in MAY must see provider for future refills What changed:   how much to take  how to take this  when to take this  additional instructions   omeprazole 40 MG capsule Commonly known  as: PRILOSEC Take 1 capsule (40 mg total) by mouth daily. Annual appt due in MAY must see provider for future refills   onetouch ultrasoft lancets Use as instructed   OneTouch Verio IQ System w/Device Kit Use to check sugars 1-2 times per day   rosuvastatin 10 MG tablet Commonly known as: CRESTOR Take 1 tablet (10 mg total) by  mouth daily. Annual appt due in MAY must see provider for future refills What changed: additional instructions   sildenafil 100 MG tablet Commonly known as: Viagra Take 1 tablet (100 mg total) by mouth daily as needed for erectile dysfunction.   tiZANidine 4 MG tablet Commonly known as: Zanaflex Take 1 tablet (4 mg total) by mouth every 6 (six) hours as needed for muscle spasms.   Trulicity 1.5 JW/1.1BJ Sopn Generic drug: Dulaglutide Inject 1 pen into the skin once a week. What changed: how much to take   verapamil 240 MG CR tablet Commonly known as: CALAN-SR Take 1 tablet (240 mg total) by mouth daily. Annual appt due in MAY must see provider for future refills   vitamin B-12 1000 MCG tablet Commonly known as: CYANOCOBALAMIN Take 1 tablet (1,000 mcg total) by mouth daily.   Vitamin D (Ergocalciferol) 1.25 MG (50000 UNIT) Caps capsule Commonly known as: DRISDOL Take 1 capsule (50,000 Units total) by mouth every 7 (seven) days.            Durable Medical Equipment  (From admission, onward)         Start     Ordered   08/10/19 1804  DME Walker rolling  Once    Question:  Patient needs a walker to treat with the following condition  Answer:  Primary osteoarthritis of right knee   08/10/19 1804   08/10/19 1804  DME 3 n 1  Once     08/10/19 1804   08/10/19 1804  DME Bedside commode  Once    Question:  Patient needs a bedside commode to treat with the following condition  Answer:  Primary osteoarthritis of right knee   08/10/19 1804          Diagnostic Studies: DG Chest 2 View  Result Date: 08/03/2019 CLINICAL DATA:  69 year old male,  preoperative exam EXAM: CHEST - 2 VIEW COMPARISON:  11/17/2013 FINDINGS: Cardiomediastinal silhouette unchanged in size and contour. No evidence of central vascular congestion. No interlobular septal thickening. No pneumothorax or pleural effusion. No confluent airspace disease. No displaced fracture.  Minimal degenerative changes of the spine. IMPRESSION: Negative for acute cardiopulmonary disease Electronically Signed   By: Corrie Mckusick D.O.   On: 08/03/2019 15:07    Disposition: Discharge disposition: 01-Home or Self Care       Discharge Instructions    Call MD / Call 911   Complete by: As directed    If you experience chest pain or shortness of breath, CALL 911 and be transported to the hospital emergency room.  If you develope a fever above 101 F, pus (white drainage) or increased drainage or redness at the wound, or calf pain, call your surgeon's office.   Constipation Prevention   Complete by: As directed    Drink plenty of fluids.  Prune juice may be helpful.  You may use a stool softener, such as Colace (over the counter) 100 mg twice a day.  Use MiraLax (over the counter) for constipation as needed.   Diet - low sodium heart healthy   Complete by: As directed    Discharge instructions   Complete by: As directed    INSTRUCTIONS AFTER JOINT REPLACEMENT   Remove items at home which could result in a fall. This includes throw rugs or furniture in walking pathways ICE to the affected joint every three hours while awake for 30 minutes at a time, for at least the first 3-5 days, and then as needed for pain  and swelling.  Continue to use ice for pain and swelling. You may notice swelling that will progress down to the foot and ankle.  This is normal after surgery.  Elevate your leg when you are not up walking on it.   Continue to use the breathing machine you got in the hospital (incentive spirometer) which will help keep your temperature down.  It is common for your temperature to cycle  up and down following surgery, especially at night when you are not up moving around and exerting yourself.  The breathing machine keeps your lungs expanded and your temperature down.   DIET:  As you were doing prior to hospitalization, we recommend a well-balanced diet.  DRESSING / WOUND CARE / SHOWERING  You may shower 3 days after surgery, but keep the wounds dry during showering.  You may use an occlusive plastic wrap (Press'n Seal for example), NO SOAKING/SUBMERGING IN THE BATHTUB.  If the bandage gets wet, change with a clean dry gauze.  If the incision gets wet, pat the wound dry with a clean towel.  ACTIVITY  Increase activity slowly as tolerated, but follow the weight bearing instructions below.   No driving for 6 weeks or until further direction given by your physician.  You cannot drive while taking narcotics.  No lifting or carrying greater than 10 lbs. until further directed by your surgeon. Avoid periods of inactivity such as sitting longer than an hour when not asleep. This helps prevent blood clots.  You may return to work once you are authorized by your doctor.     WEIGHT BEARING   Weight bearing as tolerated with assist device (walker, cane, etc) as directed, use it as long as suggested by your surgeon or therapist, typically at least 4-6 weeks.   EXERCISES  Results after joint replacement surgery are often greatly improved when you follow the exercise, range of motion and muscle strengthening exercises prescribed by your doctor. Safety measures are also important to protect the joint from further injury. Any time any of these exercises cause you to have increased pain or swelling, decrease what you are doing until you are comfortable again and then slowly increase them. If you have problems or questions, call your caregiver or physical therapist for advice.   Rehabilitation is important following a joint replacement. After just a few days of immobilization, the muscles  of the leg can become weakened and shrink (atrophy).  These exercises are designed to build up the tone and strength of the thigh and leg muscles and to improve motion. Often times heat used for twenty to thirty minutes before working out will loosen up your tissues and help with improving the range of motion but do not use heat for the first two weeks following surgery (sometimes heat can increase post-operative swelling).   These exercises can be done on a training (exercise) mat, on the floor, on a table or on a bed. Use whatever works the best and is most comfortable for you.    Use music or television while you are exercising so that the exercises are a pleasant break in your day. This will make your life better with the exercises acting as a break in your routine that you can look forward to.   Perform all exercises about fifteen times, three times per day or as directed.  You should exercise both the operative leg and the other leg as well.   Exercises include:   Quad Sets - Tighten up the  muscle on the front of the thigh (Quad) and hold for 5-10 seconds.   Straight Leg Raises - With your knee straight (if you were given a brace, keep it on), lift the leg to 60 degrees, hold for 3 seconds, and slowly lower the leg.  Perform this exercise against resistance later as your leg gets stronger.  Leg Slides: Lying on your back, slowly slide your foot toward your buttocks, bending your knee up off the floor (only go as far as is comfortable). Then slowly slide your foot back down until your leg is flat on the floor again.  Angel Wings: Lying on your back spread your legs to the side as far apart as you can without causing discomfort.  Hamstring Strength:  Lying on your back, push your heel against the floor with your leg straight by tightening up the muscles of your buttocks.  Repeat, but this time bend your knee to a comfortable angle, and push your heel against the floor.  You may put a pillow under the  heel to make it more comfortable if necessary.   A rehabilitation program following joint replacement surgery can speed recovery and prevent re-injury in the future due to weakened muscles. Contact your doctor or a physical therapist for more information on knee rehabilitation.    CONSTIPATION  Constipation is defined medically as fewer than three stools per week and severe constipation as less than one stool per week.  Even if you have a regular bowel pattern at home, your normal regimen is likely to be disrupted due to multiple reasons following surgery.  Combination of anesthesia, postoperative narcotics, change in appetite and fluid intake all can affect your bowels.   YOU MUST use at least one of the following options; they are listed in order of increasing strength to get the job done.  They are all available over the counter, and you may need to use some, POSSIBLY even all of these options:    Drink plenty of fluids (prune juice may be helpful) and high fiber foods Colace 100 mg by mouth twice a day  Senokot for constipation as directed and as needed Dulcolax (bisacodyl), take with full glass of water  Miralax (polyethylene glycol) once or twice a day as needed.  If you have tried all these things and are unable to have a bowel movement in the first 3-4 days after surgery call either your surgeon or your primary doctor.    If you experience loose stools or diarrhea, hold the medications until you stool forms back up.  If your symptoms do not get better within 1 week or if they get worse, check with your doctor.  If you experience "the worst abdominal pain ever" or develop nausea or vomiting, please contact the office immediately for further recommendations for treatment.   ITCHING:  If you experience itching with your medications, try taking only a single pain pill, or even half a pain pill at a time.  You can also use Benadryl over the counter for itching or also to help with sleep.    TED HOSE STOCKINGS:  Use stockings on both legs until for at least 2 weeks or as directed by physician office. They may be removed at night for sleeping.  MEDICATIONS:  See your medication summary on the "After Visit Summary" that nursing will review with you.  You may have some home medications which will be placed on hold until you complete the course of blood thinner medication.  It  is important for you to complete the blood thinner medication as prescribed.  PRECAUTIONS:  If you experience chest pain or shortness of breath - call 911 immediately for transfer to the hospital emergency department.   If you develop a fever greater that 101 F, purulent drainage from wound, increased redness or drainage from wound, foul odor from the wound/dressing, or calf pain - CONTACT YOUR SURGEON.                                                   FOLLOW-UP APPOINTMENTS:  If you do not already have a post-op appointment, please call the office for an appointment to be seen by your surgeon.  Guidelines for how soon to be seen are listed in your "After Visit Summary", but are typically between 1-4 weeks after surgery.  OTHER INSTRUCTIONS:   Knee Replacement:  Do not place pillow under knee, focus on keeping the knee straight while resting. CPM instructions: 0-90 degrees, 2 hours in the morning, 2 hours in the afternoon, and 2 hours in the evening. Place foam block, curve side up under heel at all times except when in CPM or when walking.  DO NOT modify, tear, cut, or change the foam block in any way.   DENTAL ANTIBIOTICS:  In most cases prophylactic antibiotics for Dental procdeures after total joint surgery are not necessary.  Exceptions are as follows:  1. History of prior total joint infection  2. Severely immunocompromised (Organ Transplant, cancer chemotherapy, Rheumatoid biologic meds such as [place])  3. Poorly controlled diabetes (A1C &gt; 8.0, blood glucose over 200)  If you have one of these  conditions, contact your surgeon for an antibiotic prescription, prior to your dental procedure.   MAKE SURE YOU:  Understand these instructions.  Get help right away if you are not doing well or get worse.    Thank you for letting us be a part of your medical care team.  It is a privilege we respect greatly.  We hope these instructions will help you stay on track for a fast and full recovery!   Increase activity slowly as tolerated   Complete by: As directed       Follow-up Information    Melrose Nakayama, MD. Go on 08/10/2019.   Specialty: Orthopedic Surgery Why: Your appointment has been scheduled for 9:45.  Contact information: [place] [address] [phone]        [facility] Follow up.   Specialty: [place] Why: You will be seen at home by [place] for 5 visits prior to starting outpatient physical therapy  Contact information: [address] [place] [address] [phone]        [facility], [place]. Go on 08/20/2019.   Why: Your appointment has been scheduled for 10:40. Please go across the lobby after your MD visit to complete your paperwork.  Contact information: Physical Therapy [address] [place]  [address] [phone]            Signed: Larwance Sachs Earma Nicolaou 08/11/2019, 7:53 AM

## 2019-08-11 NOTE — TOC Transition Note (Signed)
Transition of Care [facility]) - CM/SW Discharge Note   Patient Details  Name: Matthew Keller MRN: [id number] Date of Birth: 09/23/1950  [facility] CM/SW Contact:  Lia Hopping, [place] Phone Number: 08/11/2019, 10:17 AM   Clinical Narrative:    CSW confirm Ortho-bundle plan of care with patient.  Patient reports he does not have a 3 in 1.  RW and 3 IN 1 ordered through [facility] equip.    Final next level of care: [place] Barriers to Discharge: Barriers Resolved   Patient Goals and CMS Choice        Discharge Placement                       Discharge Plan and Services                DME Arranged: Walker rolling DME Agency: [employer]       HH Arranged: PT [place] Agency: [facility] [employer]        Social Determinants of Health ([place]) Interventions     Readmission Risk Interventions No flowsheet data found.

## 2019-08-11 NOTE — Telephone Encounter (Signed)
Please refill as per office routine med refill policy (all routine meds refilled for 3 mo or monthly per pt preference up to one year from last visit, then month to month grace period for 3 mo, then further med refills will have to be denied)  

## 2019-08-11 NOTE — Anesthesia Postprocedure Evaluation (Signed)
Anesthesia Post Note  Patient: Matthew Keller  Procedure(s) Performed: RIGHT TOTAL KNEE ARTHROPLASTY (Right Knee)     Patient location during evaluation: PACU Anesthesia Type: Spinal Level of consciousness: awake and alert Pain management: pain level controlled Vital Signs Assessment: post-procedure vital signs reviewed and stable Respiratory status: spontaneous breathing and respiratory function stable Cardiovascular status: blood pressure returned to baseline and stable Postop Assessment: spinal receding and no apparent nausea or vomiting Anesthetic complications: no    Last Vitals:  Vitals:   08/11/19 0647 08/11/19 1000  BP: (!) 151/89 (!) 163/80  Pulse: 72 68  Resp: 14 18  Temp: 36.7 C   SpO2: 100% 100%    Last Pain:  Vitals:   08/11/19 0930  TempSrc:   PainSc: [place]

## 2019-08-12 ENCOUNTER — Other Ambulatory Visit: Payer: Self-pay | Admitting: Internal Medicine

## 2019-08-12 ENCOUNTER — Telehealth: Payer: Self-pay | Admitting: *Deleted

## 2019-08-12 ENCOUNTER — Encounter: Payer: Self-pay | Admitting: *Deleted

## 2019-08-12 DIAGNOSIS — G4733 Obstructive sleep apnea (adult) (pediatric): Secondary | ICD-10-CM | POA: Diagnosis not present

## 2019-08-12 DIAGNOSIS — I2510 Atherosclerotic heart disease of native coronary artery without angina pectoris: Secondary | ICD-10-CM | POA: Diagnosis not present

## 2019-08-12 DIAGNOSIS — I129 Hypertensive chronic kidney disease with stage 1 through stage 4 chronic kidney disease, or unspecified chronic kidney disease: Secondary | ICD-10-CM | POA: Diagnosis not present

## 2019-08-12 DIAGNOSIS — Z471 Aftercare following joint replacement surgery: Secondary | ICD-10-CM | POA: Diagnosis not present

## 2019-08-12 DIAGNOSIS — E1122 Type 2 diabetes mellitus with diabetic chronic kidney disease: Secondary | ICD-10-CM | POA: Diagnosis not present

## 2019-08-12 DIAGNOSIS — N138 Other obstructive and reflux uropathy: Secondary | ICD-10-CM | POA: Diagnosis not present

## 2019-08-12 DIAGNOSIS — N401 Enlarged prostate with lower urinary tract symptoms: Secondary | ICD-10-CM | POA: Diagnosis not present

## 2019-08-12 DIAGNOSIS — N183 Chronic kidney disease, stage 3 unspecified: Secondary | ICD-10-CM | POA: Diagnosis not present

## 2019-08-12 DIAGNOSIS — E1151 Type 2 diabetes mellitus with diabetic peripheral angiopathy without gangrene: Secondary | ICD-10-CM | POA: Diagnosis not present

## 2019-08-12 NOTE — Telephone Encounter (Signed)
Please refill as per office routine med refill policy (all routine meds refilled for 3 mo or monthly per pt preference up to one year from last visit, then month to month grace period for 3 mo, then further med refills will have to be denied)  

## 2019-08-12 NOTE — Telephone Encounter (Signed)
Pt was on TCM report admitted 08/10/19 for Primary osteoarthritis of right knee. Pt underwent RIGHT TOTAL KNEE ARTHROPLASTY. Pt tolerated procedure well. Pt D/C 08/11/19 and will f/u w/ Melrose Nakayama, MD (Orthopedic Surgery).Marland KitchenJohny Chess

## 2019-08-13 ENCOUNTER — Other Ambulatory Visit: Payer: Self-pay | Admitting: Internal Medicine

## 2019-08-13 DIAGNOSIS — Z471 Aftercare following joint replacement surgery: Secondary | ICD-10-CM | POA: Diagnosis not present

## 2019-08-13 DIAGNOSIS — E1151 Type 2 diabetes mellitus with diabetic peripheral angiopathy without gangrene: Secondary | ICD-10-CM | POA: Diagnosis not present

## 2019-08-13 DIAGNOSIS — E1122 Type 2 diabetes mellitus with diabetic chronic kidney disease: Secondary | ICD-10-CM | POA: Diagnosis not present

## 2019-08-13 DIAGNOSIS — N401 Enlarged prostate with lower urinary tract symptoms: Secondary | ICD-10-CM | POA: Diagnosis not present

## 2019-08-13 DIAGNOSIS — N138 Other obstructive and reflux uropathy: Secondary | ICD-10-CM | POA: Diagnosis not present

## 2019-08-13 DIAGNOSIS — N183 Chronic kidney disease, stage 3 unspecified: Secondary | ICD-10-CM | POA: Diagnosis not present

## 2019-08-13 DIAGNOSIS — I129 Hypertensive chronic kidney disease with stage 1 through stage 4 chronic kidney disease, or unspecified chronic kidney disease: Secondary | ICD-10-CM | POA: Diagnosis not present

## 2019-08-13 DIAGNOSIS — G4733 Obstructive sleep apnea (adult) (pediatric): Secondary | ICD-10-CM | POA: Diagnosis not present

## 2019-08-13 DIAGNOSIS — I2510 Atherosclerotic heart disease of native coronary artery without angina pectoris: Secondary | ICD-10-CM | POA: Diagnosis not present

## 2019-08-13 NOTE — Telephone Encounter (Signed)
Please refill as per office routine med refill policy (all routine meds refilled for 3 mo or monthly per pt preference up to one year from last visit, then month to month grace period for 3 mo, then further med refills will have to be denied)  

## 2019-08-16 DIAGNOSIS — I129 Hypertensive chronic kidney disease with stage 1 through stage 4 chronic kidney disease, or unspecified chronic kidney disease: Secondary | ICD-10-CM | POA: Diagnosis not present

## 2019-08-16 DIAGNOSIS — N183 Chronic kidney disease, stage 3 unspecified: Secondary | ICD-10-CM | POA: Diagnosis not present

## 2019-08-16 DIAGNOSIS — I2510 Atherosclerotic heart disease of native coronary artery without angina pectoris: Secondary | ICD-10-CM | POA: Diagnosis not present

## 2019-08-16 DIAGNOSIS — E1122 Type 2 diabetes mellitus with diabetic chronic kidney disease: Secondary | ICD-10-CM | POA: Diagnosis not present

## 2019-08-16 DIAGNOSIS — N138 Other obstructive and reflux uropathy: Secondary | ICD-10-CM | POA: Diagnosis not present

## 2019-08-16 DIAGNOSIS — N401 Enlarged prostate with lower urinary tract symptoms: Secondary | ICD-10-CM | POA: Diagnosis not present

## 2019-08-16 DIAGNOSIS — Z471 Aftercare following joint replacement surgery: Secondary | ICD-10-CM | POA: Diagnosis not present

## 2019-08-16 DIAGNOSIS — G4733 Obstructive sleep apnea (adult) (pediatric): Secondary | ICD-10-CM | POA: Diagnosis not present

## 2019-08-16 DIAGNOSIS — E1151 Type 2 diabetes mellitus with diabetic peripheral angiopathy without gangrene: Secondary | ICD-10-CM | POA: Diagnosis not present

## 2019-08-18 DIAGNOSIS — I129 Hypertensive chronic kidney disease with stage 1 through stage 4 chronic kidney disease, or unspecified chronic kidney disease: Secondary | ICD-10-CM | POA: Diagnosis not present

## 2019-08-18 DIAGNOSIS — N183 Chronic kidney disease, stage 3 unspecified: Secondary | ICD-10-CM | POA: Diagnosis not present

## 2019-08-18 DIAGNOSIS — E1122 Type 2 diabetes mellitus with diabetic chronic kidney disease: Secondary | ICD-10-CM | POA: Diagnosis not present

## 2019-08-18 DIAGNOSIS — Z471 Aftercare following joint replacement surgery: Secondary | ICD-10-CM | POA: Diagnosis not present

## 2019-08-18 DIAGNOSIS — N138 Other obstructive and reflux uropathy: Secondary | ICD-10-CM | POA: Diagnosis not present

## 2019-08-18 DIAGNOSIS — I2510 Atherosclerotic heart disease of native coronary artery without angina pectoris: Secondary | ICD-10-CM | POA: Diagnosis not present

## 2019-08-18 DIAGNOSIS — G4733 Obstructive sleep apnea (adult) (pediatric): Secondary | ICD-10-CM | POA: Diagnosis not present

## 2019-08-18 DIAGNOSIS — N401 Enlarged prostate with lower urinary tract symptoms: Secondary | ICD-10-CM | POA: Diagnosis not present

## 2019-08-18 DIAGNOSIS — E1151 Type 2 diabetes mellitus with diabetic peripheral angiopathy without gangrene: Secondary | ICD-10-CM | POA: Diagnosis not present

## 2019-08-19 DIAGNOSIS — E1151 Type 2 diabetes mellitus with diabetic peripheral angiopathy without gangrene: Secondary | ICD-10-CM | POA: Diagnosis not present

## 2019-08-19 DIAGNOSIS — N183 Chronic kidney disease, stage 3 unspecified: Secondary | ICD-10-CM | POA: Diagnosis not present

## 2019-08-19 DIAGNOSIS — I129 Hypertensive chronic kidney disease with stage 1 through stage 4 chronic kidney disease, or unspecified chronic kidney disease: Secondary | ICD-10-CM | POA: Diagnosis not present

## 2019-08-19 DIAGNOSIS — G4733 Obstructive sleep apnea (adult) (pediatric): Secondary | ICD-10-CM | POA: Diagnosis not present

## 2019-08-19 DIAGNOSIS — E1122 Type 2 diabetes mellitus with diabetic chronic kidney disease: Secondary | ICD-10-CM | POA: Diagnosis not present

## 2019-08-19 DIAGNOSIS — N138 Other obstructive and reflux uropathy: Secondary | ICD-10-CM | POA: Diagnosis not present

## 2019-08-19 DIAGNOSIS — N401 Enlarged prostate with lower urinary tract symptoms: Secondary | ICD-10-CM | POA: Diagnosis not present

## 2019-08-19 DIAGNOSIS — I2510 Atherosclerotic heart disease of native coronary artery without angina pectoris: Secondary | ICD-10-CM | POA: Diagnosis not present

## 2019-08-19 DIAGNOSIS — Z471 Aftercare following joint replacement surgery: Secondary | ICD-10-CM | POA: Diagnosis not present

## 2019-08-20 DIAGNOSIS — R262 Difficulty in walking, not elsewhere classified: Secondary | ICD-10-CM | POA: Diagnosis not present

## 2019-08-20 DIAGNOSIS — Z471 Aftercare following joint replacement surgery: Secondary | ICD-10-CM | POA: Diagnosis not present

## 2019-08-20 DIAGNOSIS — Z96651 Presence of right artificial knee joint: Secondary | ICD-10-CM | POA: Diagnosis not present

## 2019-08-20 DIAGNOSIS — M25561 Pain in right knee: Secondary | ICD-10-CM | POA: Diagnosis not present

## 2019-08-20 DIAGNOSIS — M25661 Stiffness of right knee, not elsewhere classified: Secondary | ICD-10-CM | POA: Diagnosis not present

## 2019-08-23 DIAGNOSIS — Z96651 Presence of right artificial knee joint: Secondary | ICD-10-CM | POA: Diagnosis not present

## 2019-08-23 DIAGNOSIS — M25661 Stiffness of right knee, not elsewhere classified: Secondary | ICD-10-CM | POA: Diagnosis not present

## 2019-08-23 DIAGNOSIS — Z471 Aftercare following joint replacement surgery: Secondary | ICD-10-CM | POA: Diagnosis not present

## 2019-08-23 DIAGNOSIS — R262 Difficulty in walking, not elsewhere classified: Secondary | ICD-10-CM | POA: Diagnosis not present

## 2019-08-24 ENCOUNTER — Other Ambulatory Visit: Payer: Self-pay | Admitting: Internal Medicine

## 2019-08-24 NOTE — Telephone Encounter (Signed)
Please refill as per office routine med refill policy (all routine meds refilled for 3 mo or monthly per pt preference up to one year from last visit, then month to month grace period for 3 mo, then further med refills will have to be denied)  

## 2019-08-25 DIAGNOSIS — Z471 Aftercare following joint replacement surgery: Secondary | ICD-10-CM | POA: Diagnosis not present

## 2019-08-25 DIAGNOSIS — M25661 Stiffness of right knee, not elsewhere classified: Secondary | ICD-10-CM | POA: Diagnosis not present

## 2019-08-25 DIAGNOSIS — Z96651 Presence of right artificial knee joint: Secondary | ICD-10-CM | POA: Diagnosis not present

## 2019-08-25 DIAGNOSIS — R262 Difficulty in walking, not elsewhere classified: Secondary | ICD-10-CM | POA: Diagnosis not present

## 2019-08-31 DIAGNOSIS — Z471 Aftercare following joint replacement surgery: Secondary | ICD-10-CM | POA: Diagnosis not present

## 2019-08-31 DIAGNOSIS — M25661 Stiffness of right knee, not elsewhere classified: Secondary | ICD-10-CM | POA: Diagnosis not present

## 2019-08-31 DIAGNOSIS — Z96651 Presence of right artificial knee joint: Secondary | ICD-10-CM | POA: Diagnosis not present

## 2019-08-31 DIAGNOSIS — R262 Difficulty in walking, not elsewhere classified: Secondary | ICD-10-CM | POA: Diagnosis not present

## 2019-09-03 DIAGNOSIS — M25661 Stiffness of right knee, not elsewhere classified: Secondary | ICD-10-CM | POA: Diagnosis not present

## 2019-09-03 DIAGNOSIS — R262 Difficulty in walking, not elsewhere classified: Secondary | ICD-10-CM | POA: Diagnosis not present

## 2019-09-03 DIAGNOSIS — Z471 Aftercare following joint replacement surgery: Secondary | ICD-10-CM | POA: Diagnosis not present

## 2019-09-03 DIAGNOSIS — Z96651 Presence of right artificial knee joint: Secondary | ICD-10-CM | POA: Diagnosis not present

## 2019-09-07 DIAGNOSIS — M25661 Stiffness of right knee, not elsewhere classified: Secondary | ICD-10-CM | POA: Diagnosis not present

## 2019-09-07 DIAGNOSIS — Z96651 Presence of right artificial knee joint: Secondary | ICD-10-CM | POA: Diagnosis not present

## 2019-09-07 DIAGNOSIS — Z471 Aftercare following joint replacement surgery: Secondary | ICD-10-CM | POA: Diagnosis not present

## 2019-09-07 DIAGNOSIS — R262 Difficulty in walking, not elsewhere classified: Secondary | ICD-10-CM | POA: Diagnosis not present

## 2019-09-09 DIAGNOSIS — Z96651 Presence of right artificial knee joint: Secondary | ICD-10-CM | POA: Diagnosis not present

## 2019-09-09 DIAGNOSIS — Z471 Aftercare following joint replacement surgery: Secondary | ICD-10-CM | POA: Diagnosis not present

## 2019-09-09 DIAGNOSIS — M25661 Stiffness of right knee, not elsewhere classified: Secondary | ICD-10-CM | POA: Diagnosis not present

## 2019-09-09 DIAGNOSIS — R262 Difficulty in walking, not elsewhere classified: Secondary | ICD-10-CM | POA: Diagnosis not present

## 2019-09-10 ENCOUNTER — Other Ambulatory Visit: Payer: Self-pay | Admitting: Internal Medicine

## 2019-09-13 DIAGNOSIS — M25661 Stiffness of right knee, not elsewhere classified: Secondary | ICD-10-CM | POA: Diagnosis not present

## 2019-09-13 DIAGNOSIS — Z96651 Presence of right artificial knee joint: Secondary | ICD-10-CM | POA: Diagnosis not present

## 2019-09-13 DIAGNOSIS — R262 Difficulty in walking, not elsewhere classified: Secondary | ICD-10-CM | POA: Diagnosis not present

## 2019-09-13 DIAGNOSIS — Z471 Aftercare following joint replacement surgery: Secondary | ICD-10-CM | POA: Diagnosis not present

## 2019-09-15 DIAGNOSIS — M25661 Stiffness of right knee, not elsewhere classified: Secondary | ICD-10-CM | POA: Diagnosis not present

## 2019-09-15 DIAGNOSIS — Z471 Aftercare following joint replacement surgery: Secondary | ICD-10-CM | POA: Diagnosis not present

## 2019-09-15 DIAGNOSIS — Z96651 Presence of right artificial knee joint: Secondary | ICD-10-CM | POA: Diagnosis not present

## 2019-09-15 DIAGNOSIS — R262 Difficulty in walking, not elsewhere classified: Secondary | ICD-10-CM | POA: Diagnosis not present

## 2019-09-20 DIAGNOSIS — M25661 Stiffness of right knee, not elsewhere classified: Secondary | ICD-10-CM | POA: Diagnosis not present

## 2019-09-20 DIAGNOSIS — R262 Difficulty in walking, not elsewhere classified: Secondary | ICD-10-CM | POA: Diagnosis not present

## 2019-09-20 DIAGNOSIS — Z471 Aftercare following joint replacement surgery: Secondary | ICD-10-CM | POA: Diagnosis not present

## 2019-09-20 DIAGNOSIS — Z96651 Presence of right artificial knee joint: Secondary | ICD-10-CM | POA: Diagnosis not present

## 2019-09-24 DIAGNOSIS — R262 Difficulty in walking, not elsewhere classified: Secondary | ICD-10-CM | POA: Diagnosis not present

## 2019-09-24 DIAGNOSIS — Z471 Aftercare following joint replacement surgery: Secondary | ICD-10-CM | POA: Diagnosis not present

## 2019-09-24 DIAGNOSIS — Z96651 Presence of right artificial knee joint: Secondary | ICD-10-CM | POA: Diagnosis not present

## 2019-09-24 DIAGNOSIS — M25661 Stiffness of right knee, not elsewhere classified: Secondary | ICD-10-CM | POA: Diagnosis not present

## 2019-09-28 DIAGNOSIS — Z96651 Presence of right artificial knee joint: Secondary | ICD-10-CM | POA: Diagnosis not present

## 2019-09-28 DIAGNOSIS — Z471 Aftercare following joint replacement surgery: Secondary | ICD-10-CM | POA: Diagnosis not present

## 2019-09-28 DIAGNOSIS — R262 Difficulty in walking, not elsewhere classified: Secondary | ICD-10-CM | POA: Diagnosis not present

## 2019-09-28 DIAGNOSIS — M25661 Stiffness of right knee, not elsewhere classified: Secondary | ICD-10-CM | POA: Diagnosis not present

## 2019-09-30 DIAGNOSIS — Z471 Aftercare following joint replacement surgery: Secondary | ICD-10-CM | POA: Diagnosis not present

## 2019-09-30 DIAGNOSIS — M25661 Stiffness of right knee, not elsewhere classified: Secondary | ICD-10-CM | POA: Diagnosis not present

## 2019-09-30 DIAGNOSIS — Z96651 Presence of right artificial knee joint: Secondary | ICD-10-CM | POA: Diagnosis not present

## 2019-09-30 DIAGNOSIS — R262 Difficulty in walking, not elsewhere classified: Secondary | ICD-10-CM | POA: Diagnosis not present

## 2019-10-05 DIAGNOSIS — M24661 Ankylosis, right knee: Secondary | ICD-10-CM | POA: Diagnosis not present

## 2019-10-05 DIAGNOSIS — Z96651 Presence of right artificial knee joint: Secondary | ICD-10-CM | POA: Diagnosis not present

## 2019-10-05 DIAGNOSIS — G8918 Other acute postprocedural pain: Secondary | ICD-10-CM | POA: Diagnosis not present

## 2019-10-05 DIAGNOSIS — T8482XA Fibrosis due to internal orthopedic prosthetic devices, implants and grafts, initial encounter: Secondary | ICD-10-CM | POA: Diagnosis not present

## 2019-10-06 DIAGNOSIS — Z471 Aftercare following joint replacement surgery: Secondary | ICD-10-CM | POA: Diagnosis not present

## 2019-10-06 DIAGNOSIS — Z96651 Presence of right artificial knee joint: Secondary | ICD-10-CM | POA: Diagnosis not present

## 2019-10-06 DIAGNOSIS — R262 Difficulty in walking, not elsewhere classified: Secondary | ICD-10-CM | POA: Diagnosis not present

## 2019-10-06 DIAGNOSIS — M25661 Stiffness of right knee, not elsewhere classified: Secondary | ICD-10-CM | POA: Diagnosis not present

## 2019-10-07 DIAGNOSIS — R262 Difficulty in walking, not elsewhere classified: Secondary | ICD-10-CM | POA: Diagnosis not present

## 2019-10-07 DIAGNOSIS — Z96651 Presence of right artificial knee joint: Secondary | ICD-10-CM | POA: Diagnosis not present

## 2019-10-07 DIAGNOSIS — Z471 Aftercare following joint replacement surgery: Secondary | ICD-10-CM | POA: Diagnosis not present

## 2019-10-07 DIAGNOSIS — M25661 Stiffness of right knee, not elsewhere classified: Secondary | ICD-10-CM | POA: Diagnosis not present

## 2019-10-10 ENCOUNTER — Other Ambulatory Visit: Payer: Self-pay | Admitting: Internal Medicine

## 2019-10-10 NOTE — Telephone Encounter (Signed)
Please refill as per office routine med refill policy (all routine meds refilled for 3 mo or monthly per pt preference up to one year from last visit, then month to month grace period for 3 mo, then further med refills will have to be denied)  

## 2019-10-12 DIAGNOSIS — Z96651 Presence of right artificial knee joint: Secondary | ICD-10-CM | POA: Diagnosis not present

## 2019-10-12 DIAGNOSIS — R262 Difficulty in walking, not elsewhere classified: Secondary | ICD-10-CM | POA: Diagnosis not present

## 2019-10-12 DIAGNOSIS — Z471 Aftercare following joint replacement surgery: Secondary | ICD-10-CM | POA: Diagnosis not present

## 2019-10-12 DIAGNOSIS — M25661 Stiffness of right knee, not elsewhere classified: Secondary | ICD-10-CM | POA: Diagnosis not present

## 2019-10-14 ENCOUNTER — Other Ambulatory Visit: Payer: Self-pay | Admitting: Internal Medicine

## 2019-10-14 DIAGNOSIS — Z96651 Presence of right artificial knee joint: Secondary | ICD-10-CM | POA: Diagnosis not present

## 2019-10-14 DIAGNOSIS — R262 Difficulty in walking, not elsewhere classified: Secondary | ICD-10-CM | POA: Diagnosis not present

## 2019-10-14 DIAGNOSIS — Z471 Aftercare following joint replacement surgery: Secondary | ICD-10-CM | POA: Diagnosis not present

## 2019-10-14 DIAGNOSIS — M25661 Stiffness of right knee, not elsewhere classified: Secondary | ICD-10-CM | POA: Diagnosis not present

## 2019-10-14 NOTE — Telephone Encounter (Signed)
Please refill as per office routine med refill policy (all routine meds refilled for 3 mo or monthly per pt preference up to one year from last visit, then month to month grace period for 3 mo, then further med refills will have to be denied)  

## 2019-10-18 ENCOUNTER — Other Ambulatory Visit: Payer: Self-pay | Admitting: Internal Medicine

## 2019-10-18 DIAGNOSIS — Z96651 Presence of right artificial knee joint: Secondary | ICD-10-CM | POA: Diagnosis not present

## 2019-10-18 DIAGNOSIS — M25661 Stiffness of right knee, not elsewhere classified: Secondary | ICD-10-CM | POA: Diagnosis not present

## 2019-10-18 DIAGNOSIS — Z471 Aftercare following joint replacement surgery: Secondary | ICD-10-CM | POA: Diagnosis not present

## 2019-10-18 DIAGNOSIS — R262 Difficulty in walking, not elsewhere classified: Secondary | ICD-10-CM | POA: Diagnosis not present

## 2019-10-18 NOTE — Telephone Encounter (Signed)
Please change to OTC Vitamin D3 at 2000 units per day, indefinitely.  

## 2019-10-19 NOTE — Telephone Encounter (Signed)
LVM for pt to now take OTC Vitamin D3 2000 units per day & to call back if he has any questions.

## 2019-10-20 DIAGNOSIS — R262 Difficulty in walking, not elsewhere classified: Secondary | ICD-10-CM | POA: Diagnosis not present

## 2019-10-20 DIAGNOSIS — Z96651 Presence of right artificial knee joint: Secondary | ICD-10-CM | POA: Diagnosis not present

## 2019-10-20 DIAGNOSIS — Z471 Aftercare following joint replacement surgery: Secondary | ICD-10-CM | POA: Diagnosis not present

## 2019-10-20 DIAGNOSIS — M25611 Stiffness of right shoulder, not elsewhere classified: Secondary | ICD-10-CM | POA: Diagnosis not present

## 2019-10-27 DIAGNOSIS — R262 Difficulty in walking, not elsewhere classified: Secondary | ICD-10-CM | POA: Diagnosis not present

## 2019-10-27 DIAGNOSIS — M25661 Stiffness of right knee, not elsewhere classified: Secondary | ICD-10-CM | POA: Diagnosis not present

## 2019-10-27 DIAGNOSIS — Z471 Aftercare following joint replacement surgery: Secondary | ICD-10-CM | POA: Diagnosis not present

## 2019-10-27 DIAGNOSIS — Z96651 Presence of right artificial knee joint: Secondary | ICD-10-CM | POA: Diagnosis not present

## 2019-10-29 DIAGNOSIS — R262 Difficulty in walking, not elsewhere classified: Secondary | ICD-10-CM | POA: Diagnosis not present

## 2019-10-29 DIAGNOSIS — Z96651 Presence of right artificial knee joint: Secondary | ICD-10-CM | POA: Diagnosis not present

## 2019-10-29 DIAGNOSIS — M25661 Stiffness of right knee, not elsewhere classified: Secondary | ICD-10-CM | POA: Diagnosis not present

## 2019-10-29 DIAGNOSIS — Z471 Aftercare following joint replacement surgery: Secondary | ICD-10-CM | POA: Diagnosis not present

## 2019-11-01 DIAGNOSIS — M25661 Stiffness of right knee, not elsewhere classified: Secondary | ICD-10-CM | POA: Diagnosis not present

## 2019-11-01 DIAGNOSIS — Z96651 Presence of right artificial knee joint: Secondary | ICD-10-CM | POA: Diagnosis not present

## 2019-11-01 DIAGNOSIS — Z471 Aftercare following joint replacement surgery: Secondary | ICD-10-CM | POA: Diagnosis not present

## 2019-11-01 DIAGNOSIS — R262 Difficulty in walking, not elsewhere classified: Secondary | ICD-10-CM | POA: Diagnosis not present

## 2019-11-03 DIAGNOSIS — M25661 Stiffness of right knee, not elsewhere classified: Secondary | ICD-10-CM | POA: Diagnosis not present

## 2019-11-03 DIAGNOSIS — Z96651 Presence of right artificial knee joint: Secondary | ICD-10-CM | POA: Diagnosis not present

## 2019-11-03 DIAGNOSIS — R262 Difficulty in walking, not elsewhere classified: Secondary | ICD-10-CM | POA: Diagnosis not present

## 2019-11-03 DIAGNOSIS — Z471 Aftercare following joint replacement surgery: Secondary | ICD-10-CM | POA: Diagnosis not present

## 2019-11-05 DIAGNOSIS — Z96651 Presence of right artificial knee joint: Secondary | ICD-10-CM | POA: Diagnosis not present

## 2019-11-05 DIAGNOSIS — R262 Difficulty in walking, not elsewhere classified: Secondary | ICD-10-CM | POA: Diagnosis not present

## 2019-11-05 DIAGNOSIS — M25661 Stiffness of right knee, not elsewhere classified: Secondary | ICD-10-CM | POA: Diagnosis not present

## 2019-11-05 DIAGNOSIS — Z471 Aftercare following joint replacement surgery: Secondary | ICD-10-CM | POA: Diagnosis not present

## 2019-11-10 DIAGNOSIS — M25661 Stiffness of right knee, not elsewhere classified: Secondary | ICD-10-CM | POA: Diagnosis not present

## 2019-11-17 DIAGNOSIS — Z471 Aftercare following joint replacement surgery: Secondary | ICD-10-CM | POA: Diagnosis not present

## 2019-11-17 DIAGNOSIS — R262 Difficulty in walking, not elsewhere classified: Secondary | ICD-10-CM | POA: Diagnosis not present

## 2019-11-17 DIAGNOSIS — Z96651 Presence of right artificial knee joint: Secondary | ICD-10-CM | POA: Diagnosis not present

## 2019-11-17 DIAGNOSIS — M25661 Stiffness of right knee, not elsewhere classified: Secondary | ICD-10-CM | POA: Diagnosis not present

## 2019-12-14 ENCOUNTER — Ambulatory Visit: Payer: Medicare PPO | Attending: Internal Medicine

## 2019-12-14 ENCOUNTER — Ambulatory Visit: Payer: Self-pay

## 2019-12-14 DIAGNOSIS — Z23 Encounter for immunization: Secondary | ICD-10-CM

## 2019-12-14 NOTE — Progress Notes (Signed)
   Covid-19 Vaccination Clinic  Name:  Matthew Keller    MRN: [id number] DOB: 10/31/1950  12/14/2019  Matthew Keller was observed post Covid-19 immunization for 15 minutes without incident. He was provided with Vaccine Information Sheet and instruction to access the V-Safe system.   Matthew Keller was instructed to call 911 with any severe reactions post vaccine: [name] Difficulty breathing  . Swelling of face and throat  . A fast heartbeat  . A bad rash all over body  . Dizziness and weakness

## 2020-01-05 ENCOUNTER — Other Ambulatory Visit: Payer: Self-pay | Admitting: Internal Medicine

## 2020-01-06 MED ORDER — ROSUVASTATIN CALCIUM 10 MG PO TABS: 10.0000 mg | ORAL_TABLET | Freq: Every day | ORAL | 1 refills | Status: DC

## 2020-01-06 MED ORDER — MINOXIDIL 10 MG PO TABS: ORAL_TABLET | ORAL | 1 refills | Status: DC

## 2020-01-06 MED ORDER — FUROSEMIDE 20 MG PO TABS: 20.0000 mg | ORAL_TABLET | Freq: Every day | ORAL | 1 refills | Status: DC

## 2020-01-06 NOTE — Addendum Note (Signed)
Addended by: Biagio Borg on: 01/06/2020 12:45 PM   Modules accepted: Orders

## 2020-01-20 ENCOUNTER — Other Ambulatory Visit: Payer: Self-pay | Admitting: Internal Medicine

## 2020-01-20 NOTE — Telephone Encounter (Signed)
Please refill as per office routine med refill policy (all routine meds refilled for 3 mo or monthly per pt preference up to one year from last visit, then month to month grace period for 3 mo, then further med refills will have to be denied)  

## 2020-01-24 ENCOUNTER — Other Ambulatory Visit: Payer: Self-pay | Admitting: Internal Medicine

## 2020-01-24 NOTE — Telephone Encounter (Signed)
Please refill as per office routine med refill policy (all routine meds refilled for 3 mo or monthly per pt preference up to one year from last visit, then month to month grace period for 3 mo, then further med refills will have to be denied)  

## 2020-02-02 ENCOUNTER — Ambulatory Visit: Payer: Medicare PPO | Admitting: Internal Medicine

## 2020-02-08 ENCOUNTER — Other Ambulatory Visit: Payer: Self-pay

## 2020-02-08 ENCOUNTER — Ambulatory Visit: Payer: Medicare PPO | Admitting: Internal Medicine

## 2020-02-08 ENCOUNTER — Encounter: Payer: Self-pay | Admitting: Internal Medicine

## 2020-02-08 VITALS — BP 162/100 | HR 65 | Temp 98.0°F | Ht 71.0 in | Wt 266.0 lb

## 2020-02-08 DIAGNOSIS — N1831 Chronic kidney disease, stage 3a: Secondary | ICD-10-CM

## 2020-02-08 DIAGNOSIS — Z23 Encounter for immunization: Secondary | ICD-10-CM | POA: Diagnosis not present

## 2020-02-08 DIAGNOSIS — E1165 Type 2 diabetes mellitus with hyperglycemia: Secondary | ICD-10-CM | POA: Diagnosis not present

## 2020-02-08 DIAGNOSIS — I10 Essential (primary) hypertension: Secondary | ICD-10-CM | POA: Diagnosis not present

## 2020-02-08 DIAGNOSIS — E039 Hypothyroidism, unspecified: Secondary | ICD-10-CM | POA: Diagnosis not present

## 2020-02-08 DIAGNOSIS — E1159 Type 2 diabetes mellitus with other circulatory complications: Secondary | ICD-10-CM

## 2020-02-08 DIAGNOSIS — E785 Hyperlipidemia, unspecified: Secondary | ICD-10-CM | POA: Diagnosis not present

## 2020-02-08 LAB — LIPID PANEL
Cholesterol: 135 mg/dL (ref 0–200)
HDL: 56.3 mg/dL (ref >39.00)
LDL Cholesterol: 68 mg/dL (ref 0–99)
NonHDL: 79.19
Total CHOL/HDL Ratio: 2
Triglycerides: 57 mg/dL (ref 0.0–149.0)
VLDL: 11.4 mg/dL (ref 0.0–40.0)

## 2020-02-08 LAB — HEPATIC FUNCTION PANEL
ALT: 18 U/L (ref 0–53)
AST: 23 U/L (ref 0–37)
Albumin: 4.2 g/dL (ref 3.5–5.2)
Alkaline Phosphatase: 82 U/L (ref 39–117)
Bilirubin, Direct: 0.1 mg/dL (ref 0.0–0.3)
Total Bilirubin: 0.7 mg/dL (ref 0.2–1.2)
Total Protein: 7.6 g/dL (ref 6.0–8.3)

## 2020-02-08 LAB — BASIC METABOLIC PANEL
BUN: 14 mg/dL (ref 6–23)
CO2: 32 mEq/L (ref 19–32)
Calcium: 10.1 mg/dL (ref 8.4–10.5)
Chloride: 103 mEq/L (ref 96–112)
Creatinine, Ser: 1.25 mg/dL (ref 0.40–1.50)
GFR: 58.18 mL/min — ABNORMAL LOW (ref >60.00)
Glucose, Bld: 72 mg/dL (ref 70–99)
Potassium: 3.9 mEq/L (ref 3.5–5.1)
Sodium: 140 mEq/L (ref 135–145)

## 2020-02-08 LAB — PHOSPHORUS: Phosphorus: 3.2 mg/dL (ref 2.3–4.6)

## 2020-02-08 LAB — VITAMIN D 25 HYDROXY (VIT D DEFICIENCY, FRACTURES): VITD: 36.02 ng/mL (ref 30.00–100.00)

## 2020-02-08 LAB — HEMOGLOBIN A1C: Hgb A1c MFr Bld: 6.2 % (ref 4.6–6.5)

## 2020-02-08 NOTE — Progress Notes (Signed)
Subjective:    Patient ID: Matthew Keller, male    DOB: Mar 11, 1951, 69 y.o.   MRN: [id number]  HPI  Here to f/u; overall doing ok,  Pt denies chest pain, increasing sob or doe, wheezing, orthopnea, PND, increased LE swelling, palpitations, dizziness or syncope.  Pt denies new neurological symptoms such as new headache, or facial or extremity weakness or numbness.  Pt denies polydipsia, polyuria, or low sugar episode.  Pt states overall good compliance with meds, mostly trying to follow appropriate diet, with wt overall stable,  BP < 140/90 at home.  Has some ongoing right knee stiffness and swelling s/p tkr, but no pain. Lost 11 lbs with better diet, goal is another 16 lbs Wt Readings from Last 3 Encounters:  02/08/20 266 lb (120.7 kg)  08/10/19 277 lb 12.5 oz (126 kg)  08/03/19 274 lb 2 oz (124.3 kg)   Past Medical History:  Diagnosis Date  . Atypical chest pain    LHC 08-03-01 nl coronaries and lv fn, lvedp 30.  cardiac workup by Dr. Harrington Challenger 5/09 neg cardiolite for ischemia, rec rsik reduction  . Chicken pox   . Diverticulosis    colonoscopy 9/01........Marland KitchenDr. Deatra Ina  . DM (diabetes mellitus) ([place])   . GERD (gastroesophageal reflux disease)   . Healthcare maintenance    pneumovax 09/2006, Td 04/2004, CPX June 12, 2010  . Hypertension   . Microcytic anemia   . Morbid obesity ([place])    all time high 310 2009.  target wt = 208 for BMI <30.  referred back to nutrition again June 12, 2010  . 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 Renal insufficiency    baseline 1.6 October 24, 2009 > 1.3 June 12, 2010  . Sleep apnea   . Thyroid disease    Past Surgical History:  Procedure Laterality Date  . KNEE SURGERY    . 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 His smoking use included cigarettes. He has a 0.30 pack-year smoking history. He has never used smokeless tobacco. He reports current alcohol use. He reports that he does not use drugs. family history includes Alcohol abuse in his brother and father; Diabetes in his mother; Throat cancer in his brother. No Known Allergies Current Outpatient Medications on File Prior to Visit  Medication Sig Dispense Refill  . acetaminophen (TYLENOL) 325 MG tablet Take 650 mg by mouth every 6 (six) hours as needed for moderate pain.     [name] aspirin 81 MG chewable tablet Chew 1 tablet (81 mg total) by mouth 2 (two) times daily. 30 tablet 0  . Blood Glucose Monitoring Suppl (ONETOUCH VERIO IQ SYSTEM) w/Device KIT Use to check sugars 1-2 times per day 1 kit 0  . Dulaglutide (TRULICITY) 1.5 RJ/1.8AC SOPN Inject 1.5 mg into the skin once a week. 6 pen 3  . furosemide (LASIX) 20 MG tablet Take 1 tablet (20 mg total) by mouth daily. 90 tablet 1  . glipiZIDE (GLUCOTROL XL) 2.5 MG 24 hr tablet TAKE 1 TABLET (2.5 MG TOTAL) BY MOUTH DAILY WITH BREAKFAST. 90 tablet 3  . glucose blood (ONETOUCH VERIO) test strip Use to check sugars 1-2 times per day 100 each 12  . HYDROcodone-acetaminophen (NORCO/VICODIN) 5-325 MG  tablet Take 1-2 tablets by mouth every 6 (six) hours as needed for moderate pain or severe pain (post op pain). 40 tablet 0  . JARDIANCE 25 MG TABS tablet TAKE 1 TABLET BY MOUTH EVERY DAY ANNUAL APPT DUE IN MAY MUST SEE PROVIDER FOR FUTURE REFILLS 90 tablet 0  . Lancets (ONETOUCH ULTRASOFT) lancets Use as instructed 100 each 12  . latanoprost (XALATAN) 0.005 % ophthalmic solution Place 1 drop into both eyes at bedtime.     [name] levothyroxine (SYNTHROID) 75 MCG tablet TAKE 1 TABLET BY MOUTH EVERY DAY 90 tablet 2  . losartan (COZAAR) 100 MG tablet TAKE 1 TABLET DAILY. ANNUAL APPT DUE IN MAY MUST SEE PROVIDER FOR FUTURE REFILLS 90 tablet 0  . metFORMIN (GLUCOPHAGE-XR) 500 MG 24 hr tablet TAKE 1 TABLET BY MOUTH  EVERY DAY WITH BREAKFAST 90 tablet 2  . metoprolol succinate (TOPROL-XL) 25 MG 24 hr tablet TAKE 1 TABLET BY MOUTH EVERY DAY 90 tablet 2  . minoxidil (LONITEN) 10 MG tablet TAKE 1 TAB IN THE MORNING & 1/2 TAB IN THE EVENING 135 tablet 1  . omeprazole (PRILOSEC) 40 MG capsule Take 1 capsule (40 mg total) by mouth daily. 90 capsule 3  . rosuvastatin (CRESTOR) 10 MG tablet Take 1 tablet (10 mg total) by mouth daily. 90 tablet 1  . sildenafil (VIAGRA) 100 MG tablet Take 1 tablet (100 mg total) by mouth daily as needed for erectile dysfunction. 9 tablet 0  . tiZANidine (ZANAFLEX) 4 MG tablet Take 1 tablet (4 mg total) by mouth every 6 (six) hours as needed for muscle spasms. 40 tablet 1  . verapamil (CALAN-SR) 240 MG CR tablet Take 1 tablet (240 mg total) by mouth daily. Annual appt due in MAY must see provider for future refills 90 tablet 3  . vitamin B-12 (CYANOCOBALAMIN) 1000 MCG tablet Take 1 tablet (1,000 mcg total) by mouth daily. 90 tablet 3  . Vitamin D, Ergocalciferol, (DRISDOL) 1.25 MG (50000 UNIT) CAPS capsule Take 1 capsule (50,000 Units total) by mouth every 7 (seven) days. 12 capsule 0   No current facility-administered medications on file prior to visit.   Review of Systems All otherwise neg per pt    Objective:   Physical Exam BP (!) 162/100   Pulse 65   Temp 98 F (36.7 C) (Oral)   Ht 5' 11"  (0.034 m)   Wt 266 lb (120.7 kg)   SpO2 98%   BMI 37.10 kg/m  VS noted,  Constitutional: Pt appears in NAD HENT: Head: NCAT.  Right Ear: External ear normal.  Left Ear: External ear normal.  Eyes: . Pupils are equal, round, and reactive to light. Conjunctivae and EOM are normal Nose: without d/c or deformity Neck: Neck supple. Gross normal ROM Cardiovascular: Normal rate and regular rhythm.   Pulmonary/Chest: Effort normal and breath sounds without rales or wheezing.  Abd:  Soft, NT, ND, + BS, no organomegaly Neurological: Pt is alert. At baseline orientation, motor grossly  intact Skin: Skin is warm. No rashes, other new lesions, trace right LE edema Psychiatric: Pt behavior is normal without agitation  All otherwise neg per pt Lab Results  Component Value Date   WBC 7.4 07/28/2019   HGB 13.0 07/28/2019   HCT 38.9 (L) 07/28/2019   PLT 232.0 07/28/2019   GLUCOSE 72 02/08/2020   CHOL 135 02/08/2020   TRIG 57.0 02/08/2020   HDL 56.30 02/08/2020   LDLCALC 68 02/08/2020   ALT 18 02/08/2020   AST 23  02/08/2020   NA 140 02/08/2020   K 3.9 02/08/2020   CL 103 02/08/2020   CREATININE 1.25 02/08/2020   BUN 14 02/08/2020   CO2 32 02/08/2020   TSH 4.03 07/28/2019   PSA 0.23 07/28/2019   INR 1.0 08/03/2019   HGBA1C 6.2 02/08/2020   MICROALBUR 0.8 07/28/2019          Assessment & Plan:

## 2020-02-08 NOTE — Assessment & Plan Note (Signed)
stable overall by history and exam, recent data reviewed with pt, and pt to continue medical treatment as before,  to f/u any worsening symptoms or concerns  

## 2020-02-08 NOTE — Patient Instructions (Addendum)
You had the flu shot today  Please continue all other medications as before, and refills have been done if requested.  Please have the pharmacy call with any other refills you may need.  Please continue your efforts at being more active, low cholesterol diet, and weight control.  Please keep your appointments with your specialists as you may have planned  Please go to the LAB at the blood drawing area for the tests to be done  You will be contacted by phone if any changes need to be made immediately.  Otherwise, you will receive a letter about your results with an explanation, but please check with MyChart first.  Please remember to sign up for MyChart if you have not done so, as this will be important to you in the future with finding out test results, communicating by private email, and scheduling acute appointments online when needed.  Please make an Appointment to return in 6 months, or sooner if needed 

## 2020-02-08 NOTE — Assessment & Plan Note (Addendum)

## 2020-02-09 LAB — PTH, INTACT AND CALCIUM
Calcium: 10.2 mg/dL (ref 8.6–10.3)
PTH: 145 pg/mL — ABNORMAL HIGH (ref 14–64)

## 2020-03-20 ENCOUNTER — Other Ambulatory Visit: Payer: Self-pay | Admitting: Internal Medicine

## 2020-03-20 NOTE — Telephone Encounter (Signed)
Please refill as per office routine med refill policy (all routine meds refilled for 3 mo or monthly per pt preference up to one year from last visit, then month to month grace period for 3 mo, then further med refills will have to be denied)  

## 2020-04-12 DIAGNOSIS — E119 Type 2 diabetes mellitus without complications: Secondary | ICD-10-CM | POA: Diagnosis not present

## 2020-04-12 DIAGNOSIS — H524 Presbyopia: Secondary | ICD-10-CM | POA: Diagnosis not present

## 2020-04-12 DIAGNOSIS — H2513 Age-related nuclear cataract, bilateral: Secondary | ICD-10-CM | POA: Diagnosis not present

## 2020-04-12 DIAGNOSIS — H40023 Open angle with borderline findings, high risk, bilateral: Secondary | ICD-10-CM | POA: Diagnosis not present

## 2020-07-06 ENCOUNTER — Other Ambulatory Visit: Payer: Self-pay | Admitting: Internal Medicine

## 2020-07-06 NOTE — Telephone Encounter (Signed)
Please refill as per office routine med refill policy (all routine meds refilled for 3 mo or monthly per pt preference up to one year from last visit, then month to month grace period for 3 mo, then further med refills will have to be denied)  

## 2020-07-17 ENCOUNTER — Other Ambulatory Visit: Payer: Self-pay | Admitting: Internal Medicine

## 2020-07-17 NOTE — Telephone Encounter (Signed)
Please refill as per office routine med refill policy (all routine meds refilled for 3 mo or monthly per pt preference up to one year from last visit, then month to month grace period for 3 mo, then further med refills will have to be denied)  

## 2020-07-21 ENCOUNTER — Other Ambulatory Visit: Payer: Self-pay

## 2020-07-21 ENCOUNTER — Ambulatory Visit: Payer: Medicare PPO | Attending: Internal Medicine

## 2020-07-21 DIAGNOSIS — Z23 Encounter for immunization: Secondary | ICD-10-CM

## 2020-07-21 MED ORDER — VERAPAMIL HCL ER 240 MG PO TBCR: 240.0000 mg | EXTENDED_RELEASE_TABLET | Freq: Every day | ORAL | 1 refills | Status: DC

## 2020-07-21 NOTE — Progress Notes (Signed)
   Covid-19 Vaccination Clinic  Name:  Matthew Keller    MRN: [id number] DOB: 07-16-1950  07/21/2020  [name] was observed post Covid-19 immunization for 15 minutes without incident. He was provided with Vaccine Information Sheet and instruction to access the V-Safe system.   [name] was instructed to call 911 with any severe reactions post vaccine: [name] Difficulty breathing  . Swelling of face and throat  . A fast heartbeat  . A bad rash all over body  . Dizziness and weakness   Immunizations Administered    Name Date Dose VIS Date Route   PFIZER Comrnaty(Gray TOP) Covid-19 Vaccine 07/21/2020 12:11 PM 0.3 mL 03/16/2020 Intramuscular   Manufacturer: [place]   Lot: [id number]   NDC: [phone]

## 2020-07-27 ENCOUNTER — Other Ambulatory Visit (HOSPITAL_BASED_OUTPATIENT_CLINIC_OR_DEPARTMENT_OTHER): Payer: Self-pay

## 2020-07-27 MED ORDER — PFIZER-BIONT COVID-19 VAC-TRIS 30 MCG/0.3ML IM SUSP
INTRAMUSCULAR | 0 refills | Status: DC
  Filled 2020-07-27: qty 0.3, 1d supply, fill #0

## 2020-08-07 ENCOUNTER — Other Ambulatory Visit: Payer: Self-pay | Admitting: Internal Medicine

## 2020-08-22 ENCOUNTER — Telehealth: Payer: Self-pay | Admitting: Internal Medicine

## 2020-08-22 NOTE — Telephone Encounter (Signed)
LVM for pt to rtn my call to schedule awv with nha.. Please schedule AWV if pt calls the office.  

## 2020-08-30 DIAGNOSIS — H2513 Age-related nuclear cataract, bilateral: Secondary | ICD-10-CM | POA: Diagnosis not present

## 2020-08-30 DIAGNOSIS — H40023 Open angle with borderline findings, high risk, bilateral: Secondary | ICD-10-CM | POA: Diagnosis not present

## 2020-09-01 ENCOUNTER — Other Ambulatory Visit: Payer: Self-pay | Admitting: Internal Medicine

## 2020-09-01 NOTE — Telephone Encounter (Signed)
Please refill as per office routine med refill policy (all routine meds refilled for 3 mo or monthly per pt preference up to one year from last visit, then month to month grace period for 3 mo, then further med refills will have to be denied)  

## 2020-09-06 ENCOUNTER — Telehealth: Payer: Self-pay | Admitting: Internal Medicine

## 2020-09-06 NOTE — Telephone Encounter (Signed)
LVM for pt to rtn my call to r/s appt with nha on 09/11/20. Please r/s appt with nha if pt calls the office

## 2020-09-08 ENCOUNTER — Other Ambulatory Visit: Payer: Self-pay | Admitting: Internal Medicine

## 2020-09-11 ENCOUNTER — Ambulatory Visit: Payer: Medicare PPO

## 2020-09-11 ENCOUNTER — Other Ambulatory Visit: Payer: Self-pay | Admitting: Internal Medicine

## 2020-09-13 ENCOUNTER — Other Ambulatory Visit: Payer: Self-pay | Admitting: Internal Medicine

## 2020-09-20 ENCOUNTER — Other Ambulatory Visit: Payer: Self-pay | Admitting: Internal Medicine

## 2020-09-20 NOTE — Telephone Encounter (Signed)
Please refill as per office routine med refill policy (all routine meds refilled for 3 mo or monthly per pt preference up to one year from last visit, then month to month grace period for 3 mo, then further med refills will have to be denied)  

## 2020-09-21 ENCOUNTER — Other Ambulatory Visit: Payer: Self-pay | Admitting: Internal Medicine

## 2020-09-22 ENCOUNTER — Other Ambulatory Visit: Payer: Self-pay | Admitting: Internal Medicine

## 2020-09-25 ENCOUNTER — Ambulatory Visit: Payer: Medicare PPO

## 2020-10-04 ENCOUNTER — Other Ambulatory Visit: Payer: Self-pay | Admitting: Internal Medicine

## 2020-10-04 ENCOUNTER — Other Ambulatory Visit: Payer: Self-pay

## 2020-10-04 MED ORDER — TRULICITY 1.5 MG/0.5ML ~~LOC~~ SOAJ: 1.5000 mg | SUBCUTANEOUS | 0 refills | Status: DC

## 2020-10-04 MED ORDER — EMPAGLIFLOZIN 25 MG PO TABS: ORAL_TABLET | ORAL | 0 refills | Status: DC

## 2020-10-04 NOTE — Telephone Encounter (Signed)
   Patient is scheduled for an OV on 10-13-20. Please advise for refill

## 2020-10-04 NOTE — Telephone Encounter (Signed)
Ok 1 mo each

## 2020-10-13 ENCOUNTER — Ambulatory Visit: Payer: Medicare PPO | Admitting: Internal Medicine

## 2020-10-13 ENCOUNTER — Encounter: Payer: Self-pay | Admitting: Internal Medicine

## 2020-10-13 ENCOUNTER — Other Ambulatory Visit: Payer: Self-pay

## 2020-10-13 ENCOUNTER — Ambulatory Visit (INDEPENDENT_AMBULATORY_CARE_PROVIDER_SITE_OTHER): Payer: Medicare PPO

## 2020-10-13 VITALS — BP 120/80 | HR 76 | Temp 97.7°F | Ht 71.0 in | Wt 281.6 lb

## 2020-10-13 DIAGNOSIS — E1159 Type 2 diabetes mellitus with other circulatory complications: Secondary | ICD-10-CM

## 2020-10-13 DIAGNOSIS — Z0000 Encounter for general adult medical examination without abnormal findings: Secondary | ICD-10-CM

## 2020-10-13 DIAGNOSIS — N1831 Chronic kidney disease, stage 3a: Secondary | ICD-10-CM

## 2020-10-13 DIAGNOSIS — E7800 Pure hypercholesterolemia, unspecified: Secondary | ICD-10-CM

## 2020-10-13 DIAGNOSIS — E538 Deficiency of other specified B group vitamins: Secondary | ICD-10-CM

## 2020-10-13 DIAGNOSIS — I10 Essential (primary) hypertension: Secondary | ICD-10-CM | POA: Diagnosis not present

## 2020-10-13 DIAGNOSIS — E559 Vitamin D deficiency, unspecified: Secondary | ICD-10-CM | POA: Diagnosis not present

## 2020-10-13 DIAGNOSIS — E039 Hypothyroidism, unspecified: Secondary | ICD-10-CM | POA: Diagnosis not present

## 2020-10-13 DIAGNOSIS — G4733 Obstructive sleep apnea (adult) (pediatric): Secondary | ICD-10-CM | POA: Diagnosis not present

## 2020-10-13 DIAGNOSIS — E213 Hyperparathyroidism, unspecified: Secondary | ICD-10-CM

## 2020-10-13 DIAGNOSIS — E1165 Type 2 diabetes mellitus with hyperglycemia: Secondary | ICD-10-CM | POA: Diagnosis not present

## 2020-10-13 DIAGNOSIS — Z0001 Encounter for general adult medical examination with abnormal findings: Secondary | ICD-10-CM

## 2020-10-13 MED ORDER — CHOLECALCIFEROL 50 MCG (2000 UT) PO TABS: ORAL_TABLET | ORAL | 99 refills | Status: AC

## 2020-10-13 NOTE — Progress Notes (Signed)
Patient ID: Matthew Keller, male   DOB: [birth year], 70 y.o.   MRN: [id number]         Chief Complaint:: wellness exam and low vit d, dm, osa, ckd and elevated PTH       HPI:  Matthew Keller is a 70 y.o. male here for wellness exam; for pneumovax today, o/w up to date with preventive referrals and immunization                        Also s/p right TKA may 2021 and doing very well.  Not Taking Vit D.  Due for pulmonary fu for OSA.  Pt denies chest pain, increased sob or doe, wheezing, orthopnea, PND, increased LE swelling, palpitations, dizziness or syncope.  . Pt denies polydipsia, polyuria, or new focal neuro s/s.   Pt denies fever, wt loss, night sweats, loss of appetite, or other constitutional symptoms     Denies worsening depressive symptoms, suicidal ideation, or panic; has ongoing anxiety, not increased recently. Denies hyper or hypo thyroid symptoms such as voice, skin or hair change  Wt Readings from Last 3 Encounters:  10/13/20 281 lb 9.6 oz (127.7 kg)  10/13/20 281 lb 9.6 oz (127.7 kg)  02/08/20 266 lb (120.7 kg)   BP Readings from Last 3 Encounters:  10/13/20 120/80  10/13/20 120/80  02/08/20 (!) 162/100   Immunization History  Administered Date(s) Administered   Fluad Quad(high Dose 65+) 12/16/2018, 02/08/2020   Influenza Whole 01/06/2009   Influenza, High Dose Seasonal PF 06/21/2015, 01/06/2017, 12/31/2017   Influenza-Unspecified 01/06/2014   PFIZER Comirnaty(Gray Top)Covid-19 Tri-Sucrose Vaccine 07/21/2020   PFIZER(Purple Top)SARS-COV-2 Vaccination 04/28/2019, 05/17/2019, 12/14/2019   Pneumococcal Conjugate-13 11/07/2014   Pneumococcal Polysaccharide-23 11/17/2013   Tdap 08/01/2014   Zoster Recombinat (Shingrix) 08/02/2019   Health Maintenance Due  Topic Date Due   PNA vac Low Risk Adult (2 of 2 - PPSV23) 11/18/2018   HEMOGLOBIN A1C  08/07/2020      Past Medical History:  Diagnosis Date   Atypical chest pain    LHC 08-03-01 nl coronaries and lv fn, lvedp 30.   cardiac workup by Dr. Harrington Challenger 5/09 neg cardiolite for ischemia, rec rsik reduction   Chicken pox    Diverticulosis    colonoscopy 9/01........Marland KitchenDr. Deatra Ina   DM (diabetes mellitus) [facility])    GERD (gastroesophageal reflux disease)    Healthcare maintenance    pneumovax 09/2006, Td 04/2004, CPX June 12, 2010   Hypertension    Microcytic anemia    Morbid obesity ([place])    all time high 310 2009.  target wt = 208 for BMI <30.  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 His smoking use included cigarettes. He has a 0.30 pack-year smoking history. He has never used smokeless tobacco. He reports current alcohol use. He reports that he does not use drugs. family history includes  Alcohol abuse in his brother and father; Diabetes in his mother; Throat cancer in his brother. No Known Allergies Current Outpatient Medications on File Prior to Visit  Medication Sig Dispense Refill   acetaminophen (TYLENOL) 325 MG tablet Take 650 mg by mouth every 6 (six) hours as needed for moderate pain.      aspirin 81 MG chewable tablet Chew 1 tablet (81 mg total) by mouth 2 (two) times daily. 30 tablet 0   Blood Glucose Monitoring Suppl (ONETOUCH VERIO IQ SYSTEM) w/Device KIT Use to check sugars 1-2 times per day 1 kit 0   COVID-19 mRNA Vac-TriS, Pfizer, (PFIZER-BIONT COVID-19 VAC-TRIS) SUSP injection Inject into the muscle. 0.3 mL 0   [employer] VITAMIN B12 1000 MCG tablet TAKE 1 TABLET BY MOUTH EVERY DAY 30  tablet 0   Dulaglutide (TRULICITY) 1.5 GP/4.9IY SOPN Inject 1.5 mg into the skin once a week. 2 mL 0   empagliflozin (JARDIANCE) 25 MG TABS tablet TAKE 1 TABLET BY MOUTH EVERY DAY ANNUAL APPOINTMENT DUE IN MAY MUST SEE PROVIDER FOR FUTURE REFILLS 30 tablet 0   furosemide (LASIX) 20 MG tablet Take 1 tablet (20 mg total) by mouth daily. 90 tablet 1   glipiZIDE (GLUCOTROL XL) 2.5 MG 24 hr tablet TAKE 1 TABLET (2.5 MG TOTAL) BY MOUTH DAILY WITH BREAKFAST. 30 tablet 0   glucose blood (ONETOUCH VERIO) test strip Use to check sugars 1-2 times per day 100 each 12   HYDROcodone-acetaminophen (NORCO/VICODIN) 5-325 MG tablet Take 1-2 tablets by mouth every 6 (six) hours as needed for moderate pain or severe pain (post op pain). 40 tablet 0   Lancets (ONETOUCH ULTRASOFT) lancets Use as instructed 100 each 12   latanoprost (XALATAN) 0.005 % ophthalmic solution Place 1 drop into both eyes at bedtime.      levothyroxine (SYNTHROID) 75 MCG tablet TAKE 1 TABLET BY MOUTH EVERY DAY 30 tablet 1   losartan (COZAAR) 100 MG tablet TAKE 1 TABLET DAILY. ANNUAL APPT DUE IN MAY MUST SEE PROVIDER FOR FUTURE REFILLS 90 tablet 0   metFORMIN (GLUCOPHAGE-XR) 500 MG 24 hr tablet TAKE 1 TABLET BY MOUTH EVERY DAY WITH BREAKFAST 30 tablet 1   metoprolol succinate (TOPROL-XL) 25 MG 24 hr tablet TAKE 1 TABLET BY MOUTH EVERY DAY 30 tablet 1   minoxidil (LONITEN) 10 MG tablet TAKE 1 TAB IN THE MORNING & 1/2 TAB IN THE EVENING 135 tablet 1   omeprazole (PRILOSEC) 40 MG capsule TAKE 1 CAPSULE BY MOUTH EVERY DAY 30 capsule 0   rosuvastatin (CRESTOR) 10 MG tablet Take 1 tablet (10 mg total) by mouth daily. 90 tablet 1   sildenafil (VIAGRA) 100 MG tablet Take 1 tablet (100 mg total) by mouth daily as needed for erectile dysfunction. 9 tablet 0   verapamil (CALAN-SR) 240 MG CR tablet Take 1 tablet (240 mg total) by mouth daily. Annual appt due in MAY must see provider for future refills 60 tablet 1   Vitamin D, Ergocalciferol, (DRISDOL) 1.25  MG (50000 UNIT) CAPS capsule Take 1 capsule (50,000 Units total) by mouth every 7 (seven) days. 12 capsule 0   No current facility-administered medications on file prior to visit.        ROS:  All others reviewed and negative.  Objective        PE:  BP 120/80 (BP Location: Left Arm, Patient Position: Sitting, Cuff Size: Large)   Pulse 76   Temp 97.7 F (36.5 C) (Oral)   Ht 5' 11"  (1.803 m)  Wt 281 lb 9.6 oz (127.7 kg)   SpO2 97%   BMI 39.28 kg/m                 Constitutional: Pt appears in NAD               HENT: Head: NCAT.                Right Ear: External ear normal.                 Left Ear: External ear normal.                Eyes: . Pupils are equal, round, and reactive to light. Conjunctivae and EOM are normal               Nose: without d/c or deformity               Neck: Neck supple. Gross normal ROM               Cardiovascular: Normal rate and regular rhythm.                 Pulmonary/Chest: Effort normal and breath sounds without rales or wheezing.                Abd:  Soft, NT, ND, + BS, no organomegaly               Neurological: Pt is alert. At baseline orientation, motor grossly intact               Skin: Skin is warm. No rashes, no other new lesions, LE edema - none               Psychiatric: Pt behavior is normal without agitation   Micro: none  Cardiac tracings I have personally interpreted today:  none  Pertinent Radiological findings (summarize): none   Lab Results  Component Value Date   WBC 7.4 07/28/2019   HGB 13.0 07/28/2019   HCT 38.9 (L) 07/28/2019   PLT 232.0 07/28/2019   GLUCOSE 72 02/08/2020   CHOL 135 02/08/2020   TRIG 57.0 02/08/2020   HDL 56.30 02/08/2020   LDLCALC 68 02/08/2020   ALT 18 02/08/2020   AST 23 02/08/2020   NA 140 02/08/2020   K 3.9 02/08/2020   CL 103 02/08/2020   CREATININE 1.25 02/08/2020   BUN 14 02/08/2020   CO2 32 02/08/2020   TSH 4.03 07/28/2019   PSA 0.23 07/28/2019   INR 1.0 08/03/2019   HGBA1C 6.2  02/08/2020   MICROALBUR 0.8 07/28/2019   Assessment/Plan:  Matthew Keller is a 70 y.o. Black or African American [2] male with  has a past medical history of Atypical chest pain, Chicken pox, Diverticulosis, DM (diabetes mellitus) ([place]), GERD (gastroesophageal reflux disease), Healthcare maintenance, Hypertension, Microcytic anemia, Morbid obesity ([place]), OSA (obstructive sleep apnea), Renal insufficiency, Sleep apnea, and Thyroid disease.  Encounter for well adult exam with abnormal findings Age and sex appropriate education and counseling updated with regular exercise and diet Referrals for preventative services - none needed Immunizations addressed - for pneuomvax Smoking counseling  - none needed Evidence for depression or other mood disorder - none significant Most recent labs reviewed. I have personally reviewed and have noted: 1) the patient's medical and social history 2) The patient's current medications and supplements 3) The patient's height, weight, and BMI have been recorded in the chart   Poorly controlled type 2 diabetes  mellitus with circulatory disorder [facility]) Lab Results  Component Value Date   HGBA1C 6.2 02/08/2020   Stable, pt to continue current medical treatment trulicity, jardiacne, glucotrol   Obstructive sleep apnea Pt states will f/u with pulmonary, declines referral  Hypothyroidism Lab Results  Component Value Date   TSH 4.03 07/28/2019   Stable, pt to continue levothyroxine   HYPERCHOLESTEROLEMIA Lab Results  Component Value Date   LDLCALC 68 02/08/2020   Stable, pt to continue current statin crestor 10, goal ldl < 70   Essential hypertension BP Readings from Last 3 Encounters:  10/13/20 120/80  10/13/20 120/80  02/08/20 (!) 162/100   Stable, pt to continue medical treatment losartan, toprol    CKD (chronic kidney disease) stage 3, GFR 30-59 ml/min ([facility]) Lab Results  Component Value Date   CREATININE 1.25 02/08/2020   Stable  overall, cont to avoid nephrotoxins   Hyperparathyroidism ([place]) Lab Results  Component Value Date   PTH 145 (H) 02/08/2020   CALCIUM 10.1 02/08/2020   PHOS 3.2 02/08/2020  new onset , for Endo referral to r/o pirmary vs secondary, possible need for surgury  Vitamin D deficiency Has been taking some vit D until last few wks, now not taking - for restart vit d 2000 u qd  Followup: Return in about 6 months (around 04/15/2021).  Cathlean Cower, MD 10/16/2020 6:04 AM [facility]

## 2020-10-13 NOTE — Patient Instructions (Addendum)
You had the Pneumovax pneumonia shot today  Please take OTC Vitamin D3 at 2000 units per day, indefinitely  Please continue all other medications as before, and refills have been done if requested.  Please have the pharmacy call with any other refills you may need.  Please continue your efforts at being more active, low cholesterol diet, and weight control.  You are otherwise up to date with prevention measures today.  Please keep your appointments with your specialists as you may have planned - pulmonary for the sleep apnea  You will be contacted regarding the referral for: Endocrinology  Please go to the LAB at the blood drawing area for the tests to be done  /You will be contacted by phone if any changes need to be made immediately.  Otherwise, you will receive a letter about your results with an explanation, but please check with MyChart first.  Please remember to sign up for MyChart if you have not done so, as this will be important to you in the future with finding out test results, communicating by private email, and scheduling acute appointments online when needed.  Please make an Appointment to return in 6 months, or sooner if needed

## 2020-10-13 NOTE — Patient Instructions (Signed)
Matthew Keller , Thank you for taking time to come for your Medicare Wellness Visit. I appreciate your ongoing commitment to your health goals. Please review the following plan we discussed and let me know if I can assist you in the future.   Screening recommendations/referrals: Colonoscopy: last done 08/22/2017; due every 10 years Recommended yearly ophthalmology/optometry visit for glaucoma screening and checkup Recommended yearly dental visit for hygiene and checkup  Vaccinations: Influenza vaccine: 02/08/2020 Pneumococcal vaccine: 11/17/2013, 11/07/2014  Tdap vaccine: 08/01/2014; due every 10 years Shingles vaccine: 08/02/2019; check [facility] for 2nd dose Covid-19: 04/28/2019, 05/17/2019, 12/14/2019, 07/21/2020  Advanced directives: Please bring a copy of your health care power of attorney and living will to the office at your convenience.  Conditions/risks identified: Lose 20 pounds by staying physically active.  Next appointment: Please schedule your next Medicare Wellness Visit with your Nurse Health Advisor in 1 year by calling [phone].  Preventive Care 16 Years and Older, Male Preventive care refers to lifestyle choices and visits with your health care provider that can promote health and wellness. What does preventive care include? A yearly physical exam. This is also called an annual well check. Dental exams once or twice a year. Routine eye exams. Ask your health care provider how often you should have your eyes checked. Personal lifestyle choices, including: Daily care of your teeth and gums. Regular physical activity. Eating a healthy diet. Avoiding tobacco and drug use. Limiting alcohol use. Practicing safe sex. Taking low doses of aspirin every day. Taking vitamin and mineral supplements as recommended by your health care provider. What happens during an annual well check? The services and screenings done by your health care provider during your annual well check will depend on  your age, overall health, lifestyle risk factors, and family history of disease. Counseling  Your health care provider may ask you questions about your: Alcohol use. Tobacco use. Drug use. Emotional well-being. Home and relationship well-being. Sexual activity. Eating habits. History of falls. Memory and ability to understand (cognition). Work and work Statistician. Screening  You may have the following tests or measurements: Height, weight, and BMI. Blood pressure. Lipid and cholesterol levels. These may be checked every 5 years, or more frequently if you are over 33 years old. Skin check. Lung cancer screening. You may have this screening every year starting at age 23 if you have a 30-pack-year history of smoking and currently smoke or have quit within the past 15 years. Fecal occult blood test (FOBT) of the stool. You may have this test every year starting at age 23. Flexible sigmoidoscopy or colonoscopy. You may have a sigmoidoscopy every 5 years or a colonoscopy every 10 years starting at age 59. Prostate cancer screening. Recommendations will vary depending on your family history and other risks. Hepatitis C blood test. Hepatitis B blood test. Sexually transmitted disease (STD) testing. Diabetes screening. This is done by checking your blood sugar (glucose) after you have not eaten for a while (fasting). You may have this done every 1-3 years. Abdominal aortic aneurysm (AAA) screening. You may need this if you are a current or former smoker. Osteoporosis. You may be screened starting at age 23 if you are at high risk. Talk with your health care provider about your test results, treatment options, and if necessary, the need for more tests. Vaccines  Your health care provider may recommend certain vaccines, such as: Influenza vaccine. This is recommended every year. Tetanus, diphtheria, and acellular pertussis (Tdap, Td) vaccine. You may need  a Td booster every 10 years. Zoster  vaccine. You may need this after age 30. Pneumococcal 13-valent conjugate (PCV13) vaccine. One dose is recommended after age 83. Pneumococcal polysaccharide (PPSV23) vaccine. One dose is recommended after age 41. Talk to your health care provider about which screenings and vaccines you need and how often you need them. This information is not intended to replace advice given to you by your health care provider. Make sure you discuss any questions you have with your health care provider. Document Released: 04/21/2015 Document Revised: 12/13/2015 Document Reviewed: 01/24/2015 Elsevier Interactive Patient Education  2017 [place] Prevention in the Home Falls can cause injuries. They can happen to people of all ages. There are many things you can do to make your home safe and to help prevent falls. What can I do on the outside of my home? Regularly fix the edges of walkways and driveways and fix any cracks. Remove anything that might make you trip as you walk through a door, such as a raised step or threshold. Trim any bushes or trees on the path to your home. Use bright outdoor lighting. Clear any walking paths of anything that might make someone trip, such as rocks or tools. Regularly check to see if handrails are loose or broken. Make sure that both sides of any steps have handrails. Any raised decks and porches should have guardrails on the edges. Have any leaves, snow, or ice cleared regularly. Use sand or salt on walking paths during winter. Clean up any spills in your garage right away. This includes oil or grease spills. What can I do in the bathroom? Use night lights. Install grab bars by the toilet and in the tub and shower. Do not use towel bars as grab bars. Use non-skid mats or decals in the tub or shower. If you need to sit down in the shower, use a plastic, non-slip stool. Keep the floor dry. Clean up any water that spills on the floor as soon as it happens. Remove  soap buildup in the tub or shower regularly. Attach bath mats securely with double-sided non-slip rug tape. Do not have throw rugs and other things on the floor that can make you trip. What can I do in the bedroom? Use night lights. Make sure that you have a light by your bed that is easy to reach. Do not use any sheets or blankets that are too big for your bed. They should not hang down onto the floor. Have a firm chair that has side arms. You can use this for support while you get dressed. Do not have throw rugs and other things on the floor that can make you trip. What can I do in the kitchen? Clean up any spills right away. Avoid walking on wet floors. Keep items that you use a lot in easy-to-reach places. If you need to reach something above you, use a strong step stool that has a grab bar. Keep electrical cords out of the way. Do not use floor polish or wax that makes floors slippery. If you must use wax, use non-skid floor wax. Do not have throw rugs and other things on the floor that can make you trip. What can I do with my stairs? Do not leave any items on the stairs. Make sure that there are handrails on both sides of the stairs and use them. Fix handrails that are broken or loose. Make sure that handrails are as long as the stairways. Check  any carpeting to make sure that it is firmly attached to the stairs. Fix any carpet that is loose or worn. Avoid having throw rugs at the top or bottom of the stairs. If you do have throw rugs, attach them to the floor with carpet tape. Make sure that you have a light switch at the top of the stairs and the bottom of the stairs. If you do not have them, ask someone to add them for you. What else can I do to help prevent falls? Wear shoes that: Do not have high heels. Have rubber bottoms. Are comfortable and fit you well. Are closed at the toe. Do not wear sandals. If you use a stepladder: Make sure that it is fully opened. Do not climb a  closed stepladder. Make sure that both sides of the stepladder are locked into place. Ask someone to hold it for you, if possible. Clearly mark and make sure that you can see: Any grab bars or handrails. First and last steps. Where the edge of each step is. Use tools that help you move around (mobility aids) if they are needed. These include: Canes. Walkers. Scooters. Crutches. Turn on the lights when you go into a dark area. Replace any light bulbs as soon as they burn out. Set up your furniture so you have a clear path. Avoid moving your furniture around. If any of your floors are uneven, fix them. If there are any pets around you, be aware of where they are. Review your medicines with your doctor. Some medicines can make you feel dizzy. This can increase your chance of falling. Ask your doctor what other things that you can do to help prevent falls. This information is not intended to replace advice given to you by your health care provider. Make sure you discuss any questions you have with your health care provider. Document Released: 01/19/2009 Document Revised: 08/31/2015 Document Reviewed: 04/29/2014 Elsevier Interactive Patient Education  2017 [employer].

## 2020-10-13 NOTE — Progress Notes (Signed)
Subjective:   Matthew Keller is a 70 y.o. male who presents for Medicare Annual/Subsequent preventive examination.  Review of Systems     Cardiac Risk Factors include: advanced age (>85mn, >>48women);dyslipidemia;hypertension;male gender;obesity (BMI >30kg/m2)     Objective:    Today's Vitals   10/13/20 1403 10/13/20 1434  BP:  120/80  Pulse:  76  Temp:  97.7 F (36.5 C)  SpO2:  97%  Weight:  281 lb 9.6 oz (127.7 kg)  Height:  _0  (1.803 m)  PainSc: 0-No pain 0-No pain   Body mass index is 39.28 kg/m.  Advanced Directives 10/13/2020 08/10/2019 08/10/2019 08/03/2019 07/27/2019 07/22/2018 08/22/2017  Does Patient Have a Medical Advance Directive? Yes No No No No Yes No  Type of AParamedicof ASalemLiving will - - - - HPress photographerLiving will -  Does patient want to make changes to medical advance directive? No - Patient declined - - - - - -  Copy of HSedaliain Chart? No - copy requested - - - - No - copy requested -  Would patient like information on creating a medical advance directive? - No - Patient declined No - Patient declined No - Patient declined Yes (ED - Information included in AVS) - -    Current Medications (verified) Outpatient Encounter Medications as of 10/13/2020  Medication Sig   acetaminophen (TYLENOL) 325 MG tablet Take 650 mg by mouth every 6 (six) hours as needed for moderate pain.    aspirin 81 MG chewable tablet Chew 1 tablet (81 mg total) by mouth 2 (two) times daily.   Blood Glucose Monitoring Suppl (ONETOUCH VERIO IQ SYSTEM) w/Device KIT Use to check sugars 1-2 times per day   COVID-19 mRNA Vac-TriS, Pfizer, (PFIZER-BIONT COVID-19 VAC-TRIS) SUSP injection Inject into the muscle.   [facility] VITAMIN B12 1000 MCG tablet TAKE 1 TABLET BY MOUTH EVERY DAY   Dulaglutide (TRULICITY) 1.5 MOV/7.8HYSOPN Inject 1.5 mg into the skin once a week.   empagliflozin (JARDIANCE) 25 MG TABS tablet TAKE 1 TABLET BY  MOUTH EVERY DAY ANNUAL APPOINTMENT DUE IN MAY MUST SEE PROVIDER FOR FUTURE REFILLS   furosemide (LASIX) 20 MG tablet Take 1 tablet (20 mg total) by mouth daily.   glipiZIDE (GLUCOTROL XL) 2.5 MG 24 hr tablet TAKE 1 TABLET (2.5 MG TOTAL) BY MOUTH DAILY WITH BREAKFAST.   glucose blood (ONETOUCH VERIO) test strip Use to check sugars 1-2 times per day   HYDROcodone-acetaminophen (NORCO/VICODIN) 5-325 MG tablet Take 1-2 tablets by mouth every 6 (six) hours as needed for moderate pain or severe pain (post op pain).   Lancets (ONETOUCH ULTRASOFT) lancets Use as instructed   latanoprost (XALATAN) 0.005 % ophthalmic solution Place 1 drop into both eyes at bedtime.    levothyroxine (SYNTHROID) 75 MCG tablet TAKE 1 TABLET BY MOUTH EVERY DAY   losartan (COZAAR) 100 MG tablet TAKE 1 TABLET DAILY. ANNUAL APPT DUE IN MAY MUST SEE PROVIDER FOR FUTURE REFILLS   metFORMIN (GLUCOPHAGE-XR) 500 MG 24 hr tablet TAKE 1 TABLET BY MOUTH EVERY DAY WITH BREAKFAST   metoprolol succinate (TOPROL-XL) 25 MG 24 hr tablet TAKE 1 TABLET BY MOUTH EVERY DAY   minoxidil (LONITEN) 10 MG tablet TAKE 1 TAB IN THE MORNING & 1/2 TAB IN THE EVENING   omeprazole (PRILOSEC) 40 MG capsule TAKE 1 CAPSULE BY MOUTH EVERY DAY   rosuvastatin (CRESTOR) 10 MG tablet Take 1 tablet (10 mg total) by mouth daily.  sildenafil (VIAGRA) 100 MG tablet Take 1 tablet (100 mg total) by mouth daily as needed for erectile dysfunction.   verapamil (CALAN-SR) 240 MG CR tablet Take 1 tablet (240 mg total) by mouth daily. Annual appt due in MAY must see provider for future refills   Vitamin D, Ergocalciferol, (DRISDOL) 1.25 MG (50000 UNIT) CAPS capsule Take 1 capsule (50,000 Units total) by mouth every 7 (seven) days.   No facility-administered encounter medications on file as of 10/13/2020.    Allergies (verified) Patient has no known allergies.   History: Past Medical History:  Diagnosis Date   Atypical chest pain    [facility] 08-03-01 nl coronaries and lv fn,  lvedp 30.  cardiac workup by Dr. Harrington Challenger 5/09 neg cardiolite for ischemia, rec rsik reduction   Chicken pox    Diverticulosis    colonoscopy 9/01........Marland KitchenDr. Deatra Ina   DM (diabetes mellitus) [facility])    GERD (gastroesophageal reflux disease)    Healthcare maintenance    pneumovax 09/2006, Td 04/2004, CPX June 12, 2010   Hypertension    Microcytic anemia    Morbid obesity ([place])    all time high 310 2009.  target wt = 208 for BMI <30.  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)   Renal insufficiency    baseline 1.6 October 24, 2009 > 1.3 June 12, 2010   Sleep apnea    Thyroid disease    Past Surgical History:  Procedure Laterality Date   KNEE SURGERY     TOTAL KNEE ARTHROPLASTY Right 08/10/2019   Procedure: RIGHT TOTAL KNEE ARTHROPLASTY;  Surgeon: Melrose Nakayama, MD;  Location: [facility];  Service: Orthopedics;  Laterality: Right;   Family History  Problem Relation Age of Onset   Diabetes Mother    Alcohol abuse Father    Throat cancer Brother        smoker   Alcohol abuse Brother    Colon cancer Neg Hx    Esophageal cancer Neg Hx    Liver cancer Neg Hx    Pancreatic cancer Neg Hx    Rectal cancer Neg Hx    Stomach cancer Neg Hx    Social History   Socioeconomic History   Marital status: Married    Spouse name: Not on file   Number of children: 2   Years of education: 18   Highest education level: Not on file  Occupational History   Occupation: Recruitment consultant for [employer]: [employer]  Tobacco Use   Smoking status: Former    Packs/day: 0.30    Years: 1.00    Pack years: 0.30    Types: Cigarettes    Quit date: 04/08/1985    Years since quitting: 35.5   Smokeless tobacco: Never  Vaping Use   Vaping Use: Never used  Substance and Sexual Activity   Alcohol use: Yes    Comment: occasional   Drug use: No   Sexual activity: Yes  Other Topics  Concern   Not on file  Social History Narrative   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    Social Determinants of Health   Financial Resource Strain: Low Risk    Difficulty of Paying Living Expenses: Not hard  at all  Food Insecurity: No Food Insecurity   Worried About Charity fundraiser in the Last Year: Never true   Ran Out of Food in the Last Year: Never true  Transportation Needs: No Transportation Needs   Lack of Transportation (Medical): No   Lack of Transportation (Non-Medical): No  Physical Activity: Sufficiently Active   Days of Exercise per Week: 5 days   Minutes of Exercise per Session: 30 min  Stress: No Stress Concern Present   Feeling of Stress : Not at all  Social Connections: Socially Integrated   Frequency of Communication with Friends and Family: More than three times a week   Frequency of Social Gatherings with Friends and Family: More than three times a week   Attends Religious Services: More than 4 times per year   Active Member of [employer] or Organizations: Yes   Attends Music therapist: More than 4 times per year   Marital Status: Married    Tobacco Counseling Counseling given: Not Answered   Clinical Intake:  Pre-visit preparation completed: Yes  Pain : No/denies pain Pain Score: 0-No pain     BMI - recorded: 39.28 Nutritional Status: BMI > 30  Obese Nutritional Risks: None Diabetes: No  How often do you need to have someone help you when you read instructions, pamphlets, or other written materials from your doctor or pharmacy?: 1 - Never What is the last grade level you completed in school?: Bachelor's Degree from [employer]  Diabetic? no  Interpreter Needed?: No  Information entered by :: Lisette Abu, LPN   Activities of Daily Living In your present state of health, do you have  any difficulty performing the following activities: 10/13/2020  Hearing? N  Vision? N  Difficulty concentrating or making decisions? N  Walking or climbing stairs? N  Dressing or bathing? N  Doing errands, shopping? N  Preparing Food and eating ? N  Using the Toilet? N  In the past six months, have you accidently leaked urine? N  Do you have problems with loss of bowel control? N  Managing your Medications? N  Managing your Finances? N  Housekeeping or managing your Housekeeping? N  Some recent data might be hidden    Patient Care Team: Biagio Borg, MD as PCP - General (Internal Medicine)  Indicate any recent Medical Services you may have received from other than [facility] providers in the past year (date may be approximate).     Assessment:   This is a routine wellness examination for [place].  Hearing/Vision screen No results found.  Dietary issues and exercise activities discussed: Current Exercise Habits: Home exercise routine;Structured exercise class, Type of exercise: walking;stretching;strength training/weights;treadmill;Other - see comments (swimming, golfing), Time (Minutes): 30, Frequency (Times/Week): 5, Weekly Exercise (Minutes/Week): 150, Intensity: Moderate, Exercise limited by: cardiac condition(s)   Goals Addressed               This Visit's Progress     Patient Stated (pt-stated)        To lose 20 pounds by continuing to eat healthy, stay physically active and socially active.       Depression Screen PHQ 2/9 Scores 10/13/2020 07/27/2019 08/26/2018 07/22/2018 07/29/2017 07/16/2016  PHQ - 2 Score 0 0 0 0 0 0  PHQ- 9 Score - - - - 0 -    Fall Risk Fall Risk  10/13/2020 02/08/2020 07/28/2019 07/27/2019 08/26/2018  Falls in the past year? 0 0 0 0 0  Number  falls in past yr: 0 0 0 0 -  Injury with Fall? 0 0 0 0 -  Risk for fall due to : No Fall Risks No Fall Risks No Fall Risks No Fall Risks -  Follow up Falls evaluation completed - Falls evaluation completed Falls  evaluation completed;Education provided;Falls prevention discussed -    FALL RISK PREVENTION PERTAINING TO THE HOME:  Any stairs in or around the home? Yes  If so, are there any without handrails? No  Home free of loose throw rugs in walkways, pet beds, electrical cords, etc? Yes  Adequate lighting in your home to reduce risk of falls? Yes   ASSISTIVE DEVICES UTILIZED TO PREVENT FALLS:  Life alert? No  Use of a cane, walker or w/c? No  Grab bars in the bathroom? Yes  Shower chair or bench in shower? Yes  Elevated toilet seat or a handicapped toilet? Yes   TIMED UP AND GO:  Was the test performed? Yes .  Length of time to ambulate 10 feet: 7 sec.   Gait steady and fast without use of assistive device  Cognitive Function: Normal cognitive status assessed by direct observation by this Nurse Health Advisor. No abnormalities found.          Immunizations Immunization History  Administered Date(s) Administered   Fluad Quad(high Dose 65+) 12/16/2018, 02/08/2020   Influenza Whole 01/06/2009   Influenza, High Dose Seasonal PF 06/21/2015, 01/06/2017, 12/31/2017   Influenza-Unspecified 01/06/2014   PFIZER Comirnaty(Gray Top)Covid-19 Tri-Sucrose Vaccine 07/21/2020   PFIZER(Purple Top)SARS-COV-2 Vaccination 04/28/2019, 05/17/2019, 12/14/2019   Pneumococcal Conjugate-13 11/07/2014   Pneumococcal Polysaccharide-23 11/17/2013   Tdap 08/01/2014   Zoster Recombinat (Shingrix) 08/02/2019    TDAP status: Up to date  Flu Vaccine status: Up to date  Pneumococcal vaccine status: Up to date  Covid-19 vaccine status: Completed vaccines  Qualifies for Shingles Vaccine? Yes   Zostavax completed Yes   Shingrix Completed?: Yes  Screening Tests Health Maintenance  Topic Date Due   PNA vac Low Risk Adult (2 of 2 - PPSV23) 11/18/2018   OPHTHALMOLOGY EXAM  08/17/2019   Zoster Vaccines- Shingrix (2 of 2) 09/27/2019   HEMOGLOBIN A1C  08/07/2020   INFLUENZA VACCINE  11/06/2020   FOOT  EXAM  02/07/2021   TETANUS/TDAP  07/31/2024   COLONOSCOPY (Pts 45-70yr Insurance coverage will need to be confirmed)  08/23/2027   COVID-19 Vaccine  Completed   Hepatitis C Screening  Completed   HPV VACCINES  Aged Out    Health Maintenance  Health Maintenance Due  Topic Date Due   PNA vac Low Risk Adult (2 of 2 - PPSV23) 11/18/2018   OPHTHALMOLOGY EXAM  08/17/2019   Zoster Vaccines- Shingrix (2 of 2) 09/27/2019   HEMOGLOBIN A1C  08/07/2020    Colorectal cancer screening: Type of screening: Colonoscopy. Completed 08/22/2017. Repeat every 10 years  Lung Cancer Screening: (Low Dose CT Chest recommended if Age 72-80years, 30 pack-year currently smoking OR have quit w/in 15years.) does not qualify.   Lung Cancer Screening Referral: no  Additional Screening:  Hepatitis C Screening: does qualify; Completed yes  Vision Screening: Recommended annual ophthalmology exams for early detection of glaucoma and other disorders of the eye. Is the patient up to date with their annual eye exam?  Yes  Who is the provider or what is the name of the office in which the patient attends annual eye exams? [facility] MD If pt is not established with a provider, would they like to be  referred to a provider to establish care? No .   Dental Screening: Recommended annual dental exams for proper oral hygiene  Community Resource Referral / Chronic Care Management: CRR required this visit?  No   CCM required this visit?  No      Plan:     I have personally reviewed and noted the following in the patient's chart:   Medical and social history Use of alcohol, tobacco or illicit drugs  Current medications and supplements including opioid prescriptions. Patient is not currently taking opioid prescriptions. Functional ability and status Nutritional status Physical activity Advanced directives List of other physicians Hospitalizations, surgeries, and ER visits in previous 12  months Vitals Screenings to include cognitive, depression, and falls Referrals and appointments  In addition, I have reviewed and discussed with patient certain preventive protocols, quality metrics, and best practice recommendations. A written personalized care plan for preventive services as well as general preventive health recommendations were provided to patient.     Sheral Flow, LPN   06/08/4399   Nurse Notes: n/a

## 2020-10-16 ENCOUNTER — Encounter: Payer: Self-pay | Admitting: Internal Medicine

## 2020-10-16 DIAGNOSIS — E213 Hyperparathyroidism, unspecified: Secondary | ICD-10-CM | POA: Insufficient documentation

## 2020-10-16 DIAGNOSIS — E559 Vitamin D deficiency, unspecified: Secondary | ICD-10-CM | POA: Insufficient documentation

## 2020-10-16 LAB — LIPID PANEL
Cholesterol: 138 mg/dL (ref <200)
HDL: 50 mg/dL (ref >=40)
LDL Cholesterol (Calc): 69 mg/dL (calc)
Non-HDL Cholesterol (Calc): 88 mg/dL (calc) (ref <130)
Total CHOL/HDL Ratio: 2.8 (calc) (ref <5.0)
Triglycerides: 103 mg/dL (ref <150)

## 2020-10-16 LAB — VITAMIN D 25 HYDROXY (VIT D DEFICIENCY, FRACTURES): Vit D, 25-Hydroxy: 33 ng/mL (ref 30–100)

## 2020-10-16 LAB — CBC WITH DIFFERENTIAL/PLATELET
Absolute Monocytes: 656 cells/uL (ref 200–950)
Basophils Absolute: 16 cells/uL (ref 0–200)
Basophils Relative: 0.2 %
Eosinophils Absolute: 219 cells/uL (ref 15–500)
Eosinophils Relative: 2.7 %
HCT: 45.6 % (ref 38.5–50.0)
Hemoglobin: 14.8 g/dL (ref 13.2–17.1)
Lymphs Abs: 2616 cells/uL (ref 850–3900)
MCH: 25.1 pg — ABNORMAL LOW (ref 27.0–33.0)
MCHC: 32.5 g/dL (ref 32.0–36.0)
MCV: 77.4 fL — ABNORMAL LOW (ref 80.0–100.0)
MPV: 10.4 fL (ref 7.5–12.5)
Monocytes Relative: 8.1 %
Neutro Abs: 4593 cells/uL (ref 1500–7800)
Neutrophils Relative %: 56.7 %
Platelets: 259 10*3/uL (ref 140–400)
RBC: 5.89 10*6/uL — ABNORMAL HIGH (ref 4.20–5.80)
RDW: 15.8 % — ABNORMAL HIGH (ref 11.0–15.0)
Total Lymphocyte: 32.3 %
WBC: 8.1 10*3/uL (ref 3.8–10.8)

## 2020-10-16 LAB — HEMOGLOBIN A1C
Hgb A1c MFr Bld: 6.3 % of total Hgb — ABNORMAL HIGH (ref <5.7)
Mean Plasma Glucose: 134 mg/dL
eAG (mmol/L): 7.4 mmol/L

## 2020-10-16 LAB — URINALYSIS, ROUTINE W REFLEX MICROSCOPIC
Bilirubin Urine: NEGATIVE
Hgb urine dipstick: NEGATIVE
Ketones, ur: NEGATIVE
Leukocytes,Ua: NEGATIVE
Nitrite: NEGATIVE
Protein, ur: NEGATIVE
Specific Gravity, Urine: 1.011 (ref 1.001–1.035)
pH: 6 (ref 5.0–8.0)

## 2020-10-16 LAB — BASIC METABOLIC PANEL
BUN/Creatinine Ratio: 9 (calc) (ref 6–22)
BUN: 12 mg/dL (ref 7–25)
CO2: 32 mmol/L (ref 20–32)
Calcium: 10.3 mg/dL (ref 8.6–10.3)
Chloride: 103 mmol/L (ref 98–110)
Creat: 1.39 mg/dL — ABNORMAL HIGH (ref 0.70–1.18)
Glucose, Bld: 74 mg/dL (ref 65–99)
Potassium: 4.3 mmol/L (ref 3.5–5.3)
Sodium: 141 mmol/L (ref 135–146)

## 2020-10-16 LAB — HEPATIC FUNCTION PANEL
AG Ratio: 1.4 (calc) (ref 1.0–2.5)
ALT: 15 U/L (ref 9–46)
AST: 19 U/L (ref 10–35)
Albumin: 4.3 g/dL (ref 3.6–5.1)
Alkaline phosphatase (APISO): 75 U/L (ref 35–144)
Bilirubin, Direct: 0.2 mg/dL (ref 0.0–0.2)
Globulin: 3 g/dL (calc) (ref 1.9–3.7)
Indirect Bilirubin: 0.8 mg/dL (calc) (ref 0.2–1.2)
Total Bilirubin: 1 mg/dL (ref 0.2–1.2)
Total Protein: 7.3 g/dL (ref 6.1–8.1)

## 2020-10-16 LAB — MICROALBUMIN / CREATININE URINE RATIO
Creatinine, Urine: 45 mg/dL (ref 20–320)
Microalb Creat Ratio: 16 mcg/mg creat (ref <30)
Microalb, Ur: 0.7 mg/dL

## 2020-10-16 LAB — PSA: PSA: 0.17 ng/mL (ref <=4.00)

## 2020-10-16 LAB — VITAMIN B12: Vitamin B-12: 884 pg/mL (ref 200–1100)

## 2020-10-16 LAB — PTH, INTACT AND CALCIUM
Calcium: 10.3 mg/dL (ref 8.6–10.3)
PTH: 88 pg/mL — ABNORMAL HIGH (ref 16–77)

## 2020-10-16 LAB — TSH: TSH: 6.32 mIU/L — ABNORMAL HIGH (ref 0.40–4.50)

## 2020-10-16 NOTE — Assessment & Plan Note (Signed)
Lab Results  Component Value Date   TSH 4.03 07/28/2019   Stable, pt to continue levothyroxine

## 2020-10-16 NOTE — Assessment & Plan Note (Signed)
Pt states will f/u with pulmonary, declines referral

## 2020-10-16 NOTE — Assessment & Plan Note (Signed)
Age and sex appropriate education and counseling updated with regular exercise and diet Referrals for preventative services - none needed Immunizations addressed - for pneuomvax Smoking counseling  - none needed Evidence for depression or other mood disorder - none significant Most recent labs reviewed. I have personally reviewed and have noted: 1) the patient's medical and social history 2) The patient's current medications and supplements 3) The patient's height, weight, and BMI have been recorded in the chart

## 2020-10-16 NOTE — Assessment & Plan Note (Addendum)
Has been taking some vit D until last few wks, now not taking - for restart vit d 2000 u qd  Last vitamin D Lab Results  Component Value Date   VD25OH 36.02 02/08/2020

## 2020-10-16 NOTE — Assessment & Plan Note (Signed)
Lab Results  Component Value Date   PTH 145 (H) 02/08/2020   CALCIUM 10.1 02/08/2020   PHOS 3.2 02/08/2020  new onset , for Endo referral to r/o pirmary vs secondary, possible need for surgury

## 2020-10-16 NOTE — Assessment & Plan Note (Addendum)
Lab Results  Component Value Date   LDLCALC 68 02/08/2020   Stable, pt to continue current statin crestor 10, goal ldl < 70

## 2020-10-16 NOTE — Assessment & Plan Note (Signed)
BP Readings from Last 3 Encounters:  10/13/20 120/80  10/13/20 120/80  02/08/20 (!) 162/100   Stable, pt to continue medical treatment losartan, toprol

## 2020-10-16 NOTE — Assessment & Plan Note (Signed)
Lab Results  Component Value Date   CREATININE 1.25 02/08/2020   Stable overall, cont to avoid nephrotoxins

## 2020-10-16 NOTE — Assessment & Plan Note (Signed)
Lab Results  Component Value Date   HGBA1C 6.2 02/08/2020   Stable, pt to continue current medical treatment trulicity, jardiacne, glucotrol

## 2020-10-17 ENCOUNTER — Encounter: Payer: Self-pay | Admitting: Internal Medicine

## 2020-10-20 ENCOUNTER — Other Ambulatory Visit: Payer: Self-pay | Admitting: Internal Medicine

## 2020-10-20 NOTE — Telephone Encounter (Signed)
Please refill as per office routine med refill policy (all routine meds refilled for 3 mo or monthly per pt preference up to one year from last visit, then month to month grace period for 3 mo, then further med refills will have to be denied)  

## 2020-10-21 ENCOUNTER — Other Ambulatory Visit: Payer: Self-pay | Admitting: Cardiovascular Disease

## 2020-11-06 ENCOUNTER — Other Ambulatory Visit: Payer: Self-pay | Admitting: Internal Medicine

## 2020-11-06 NOTE — Telephone Encounter (Signed)
Please refill as per office routine med refill policy (all routine meds refilled for 3 mo or monthly per pt preference up to one year from last visit, then month to month grace period for 3 mo, then further med refills will have to be denied)  

## 2020-11-16 ENCOUNTER — Other Ambulatory Visit: Payer: Self-pay | Admitting: Internal Medicine

## 2020-11-16 NOTE — Telephone Encounter (Signed)
Please refill as per office routine med refill policy (all routine meds refilled for 3 mo or monthly per pt preference up to one year from last visit, then month to month grace period for 3 mo, then further med refills will have to be denied)  

## 2020-11-30 ENCOUNTER — Other Ambulatory Visit: Payer: Self-pay | Admitting: Internal Medicine

## 2020-12-03 DIAGNOSIS — R509 Fever, unspecified: Secondary | ICD-10-CM | POA: Diagnosis not present

## 2020-12-03 DIAGNOSIS — U071 COVID-19: Secondary | ICD-10-CM | POA: Diagnosis not present

## 2020-12-04 ENCOUNTER — Telehealth: Payer: Self-pay | Admitting: Internal Medicine

## 2020-12-04 MED ORDER — NIRMATRELVIR/RITONAVIR (PAXLOVID) TABLET (RENAL DOSING): 2.0000 | ORAL_TABLET | Freq: Two times a day (BID) | ORAL | 0 refills | Status: AC

## 2020-12-04 MED ORDER — HYDROCODONE BIT-HOMATROP MBR 5-1.5 MG/5ML PO SOLN: 5.0000 mL | Freq: Four times a day (QID) | ORAL | 0 refills | Status: AC | PRN

## 2020-12-04 NOTE — Telephone Encounter (Signed)
Patient called access nurse, he tested positive for covid on 8.27, patient has runny nose, cough, and mucous  Patient inquiring what to do  Left the patient a voice message to call back and schedule a virtual appointment if needed

## 2020-12-04 NOTE — Telephone Encounter (Signed)
Parchment for paxovid and cough med - done erx

## 2020-12-17 ENCOUNTER — Other Ambulatory Visit: Payer: Self-pay | Admitting: Internal Medicine

## 2020-12-17 NOTE — Telephone Encounter (Signed)
Please refill as per office routine med refill policy (all routine meds to be refilled for 3 mo or monthly (per pt preference) up to one year from last visit, then month to month grace period for 3 mo, then further med refills will have to be denied) ? ?

## 2020-12-22 IMAGING — DX DG CHEST 2V
2 series · 2 of 2 positions shown · non-contrast
Comparison: 11/17/2013

CLINICAL DATA: 68-year-old male, preoperative exam

EXAM:
CHEST - 2 VIEW

[chest pa]
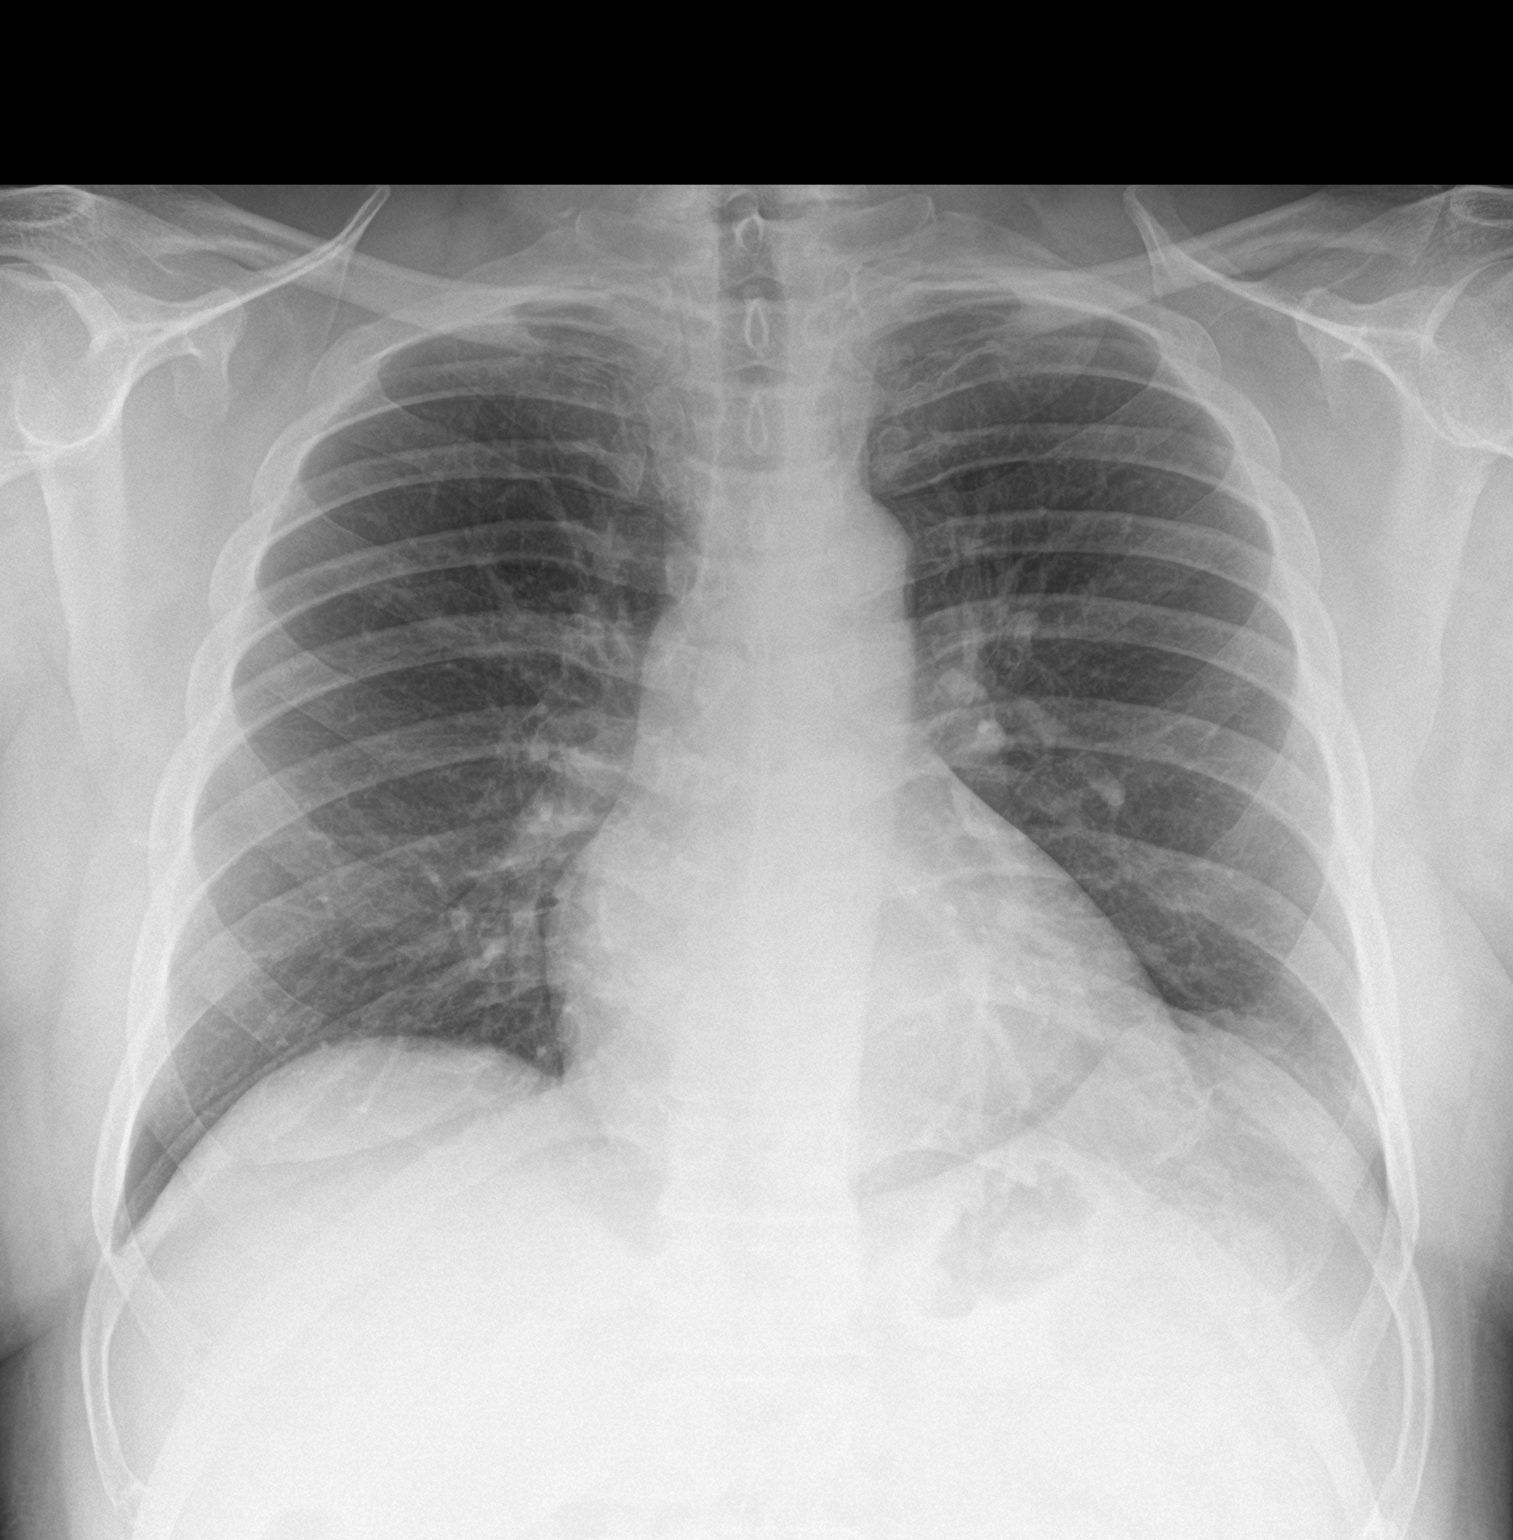

[chest lat]
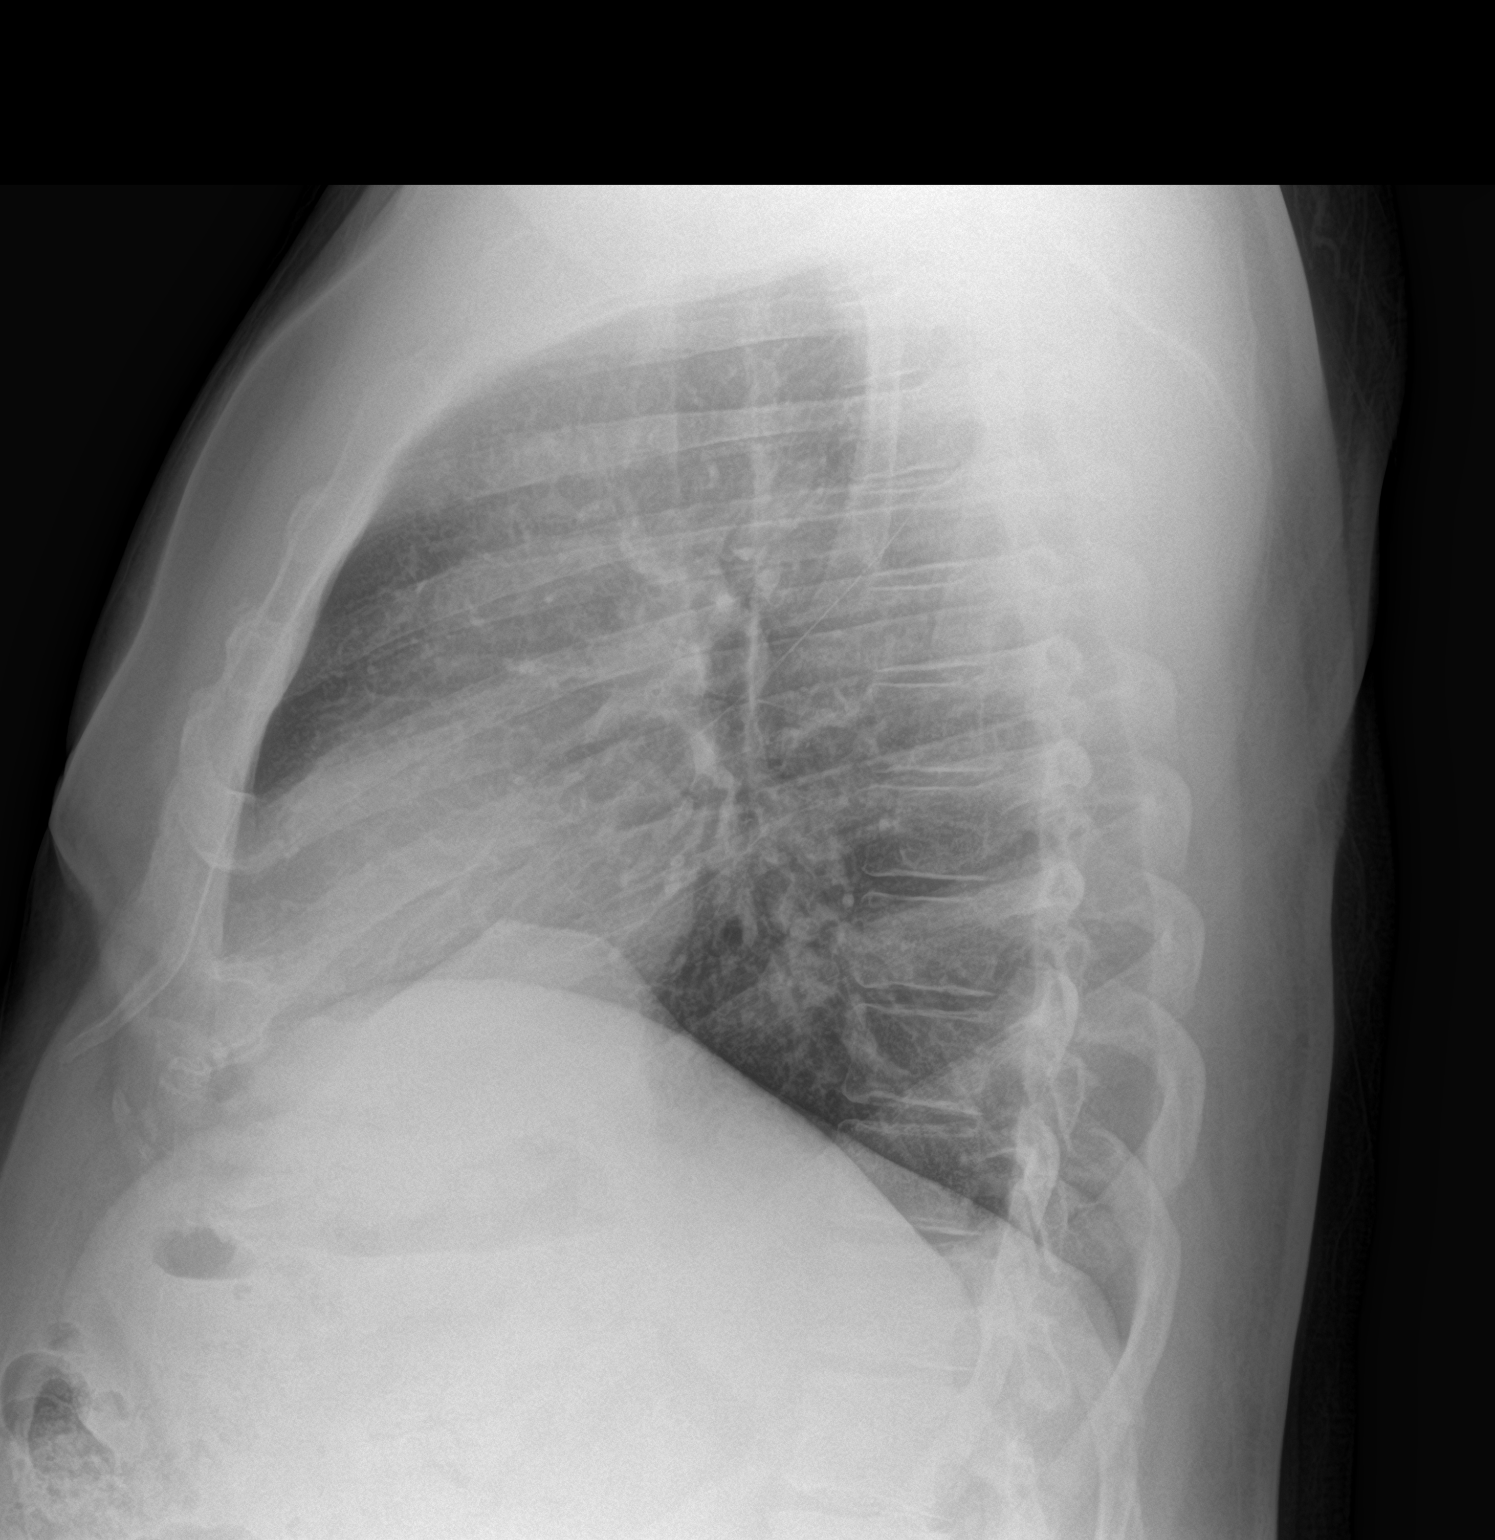

[2 of 2 positions shown; findings below may reference images not displayed]

FINDINGS: Cardiomediastinal silhouette unchanged in size and contour. No
evidence of central vascular congestion. No interlobular septal
thickening. No pneumothorax or pleural effusion. No confluent
airspace disease.

No displaced fracture.  Minimal degenerative changes of the spine.
IMPRESSION: Negative for acute cardiopulmonary disease

## 2021-01-06 ENCOUNTER — Other Ambulatory Visit: Payer: Self-pay | Admitting: Internal Medicine

## 2021-01-06 NOTE — Telephone Encounter (Signed)
Please refill as per office routine med refill policy (all routine meds to be refilled for 3 mo or monthly (per pt preference) up to one year from last visit, then month to month grace period for 3 mo, then further med refills will have to be denied) ? ?

## 2021-01-09 ENCOUNTER — Other Ambulatory Visit: Payer: Self-pay | Admitting: Internal Medicine

## 2021-01-09 NOTE — Telephone Encounter (Signed)
Please refill as per office routine med refill policy (all routine meds to be refilled for 3 mo or monthly (per pt preference) up to one year from last visit, then month to month grace period for 3 mo, then further med refills will have to be denied) ? ?

## 2021-01-10 ENCOUNTER — Ambulatory Visit: Payer: Medicare PPO | Admitting: Endocrinology

## 2021-01-11 ENCOUNTER — Other Ambulatory Visit: Payer: Self-pay | Admitting: Internal Medicine

## 2021-01-11 DIAGNOSIS — E039 Hypothyroidism, unspecified: Secondary | ICD-10-CM | POA: Diagnosis not present

## 2021-01-11 DIAGNOSIS — E785 Hyperlipidemia, unspecified: Secondary | ICD-10-CM | POA: Diagnosis not present

## 2021-01-11 DIAGNOSIS — G4733 Obstructive sleep apnea (adult) (pediatric): Secondary | ICD-10-CM | POA: Diagnosis not present

## 2021-01-11 DIAGNOSIS — I129 Hypertensive chronic kidney disease with stage 1 through stage 4 chronic kidney disease, or unspecified chronic kidney disease: Secondary | ICD-10-CM | POA: Diagnosis not present

## 2021-01-11 DIAGNOSIS — E1122 Type 2 diabetes mellitus with diabetic chronic kidney disease: Secondary | ICD-10-CM | POA: Diagnosis not present

## 2021-01-11 DIAGNOSIS — H409 Unspecified glaucoma: Secondary | ICD-10-CM | POA: Diagnosis not present

## 2021-01-11 DIAGNOSIS — E1151 Type 2 diabetes mellitus with diabetic peripheral angiopathy without gangrene: Secondary | ICD-10-CM | POA: Diagnosis not present

## 2021-01-11 DIAGNOSIS — E1159 Type 2 diabetes mellitus with other circulatory complications: Secondary | ICD-10-CM | POA: Diagnosis not present

## 2021-01-11 NOTE — Telephone Encounter (Signed)
Please refill as per office routine med refill policy (all routine meds to be refilled for 3 mo or monthly (per pt preference) up to one year from last visit, then month to month grace period for 3 mo, then further med refills will have to be denied) ? ?

## 2021-01-26 ENCOUNTER — Other Ambulatory Visit: Payer: Self-pay | Admitting: Internal Medicine

## 2021-01-26 NOTE — Telephone Encounter (Signed)
Please refill as per office routine med refill policy (all routine meds to be refilled for 3 mo or monthly (per pt preference) up to one year from last visit, then month to month grace period for 3 mo, then further med refills will have to be denied) ? ?

## 2021-02-21 ENCOUNTER — Telehealth: Payer: Self-pay | Admitting: Internal Medicine

## 2021-02-21 NOTE — Telephone Encounter (Signed)
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

## 2021-02-21 NOTE — Telephone Encounter (Signed)
Notes faxed to Urgent Care; voicemail left notifying patient

## 2021-02-23 ENCOUNTER — Other Ambulatory Visit: Payer: Self-pay | Admitting: Internal Medicine

## 2021-02-23 NOTE — Telephone Encounter (Signed)
Please refill as per office routine med refill policy (all routine meds to be refilled for 3 mo or monthly (per pt preference) up to one year from last visit, then month to month grace period for 3 mo, then further med refills will have to be denied) ? ?

## 2021-03-07 ENCOUNTER — Other Ambulatory Visit: Payer: Self-pay | Admitting: Internal Medicine

## 2021-03-07 ENCOUNTER — Ambulatory Visit (INDEPENDENT_AMBULATORY_CARE_PROVIDER_SITE_OTHER): Payer: Medicare PPO

## 2021-03-07 ENCOUNTER — Other Ambulatory Visit: Payer: Self-pay

## 2021-03-07 DIAGNOSIS — Z23 Encounter for immunization: Secondary | ICD-10-CM | POA: Diagnosis not present

## 2021-03-07 NOTE — Progress Notes (Signed)
Pt given Hd Flu vacc and Prevnar 20 w/o any complications.

## 2021-03-16 DIAGNOSIS — H40023 Open angle with borderline findings, high risk, bilateral: Secondary | ICD-10-CM | POA: Diagnosis not present

## 2021-03-16 DIAGNOSIS — H2513 Age-related nuclear cataract, bilateral: Secondary | ICD-10-CM | POA: Diagnosis not present

## 2021-03-16 DIAGNOSIS — E119 Type 2 diabetes mellitus without complications: Secondary | ICD-10-CM | POA: Diagnosis not present

## 2021-03-16 DIAGNOSIS — H25013 Cortical age-related cataract, bilateral: Secondary | ICD-10-CM | POA: Diagnosis not present

## 2021-03-16 LAB — HM DIABETES EYE EXAM

## 2021-03-19 ENCOUNTER — Ambulatory Visit (INDEPENDENT_AMBULATORY_CARE_PROVIDER_SITE_OTHER): Payer: Medicare PPO | Admitting: Pulmonary Disease

## 2021-03-19 ENCOUNTER — Other Ambulatory Visit: Payer: Self-pay

## 2021-03-19 ENCOUNTER — Encounter: Payer: Self-pay | Admitting: Pulmonary Disease

## 2021-03-19 VITALS — BP 154/96 | HR 89 | Temp 98.1°F | Ht 71.0 in | Wt 281.2 lb

## 2021-03-19 DIAGNOSIS — G4733 Obstructive sleep apnea (adult) (pediatric): Secondary | ICD-10-CM | POA: Diagnosis not present

## 2021-03-19 NOTE — Assessment & Plan Note (Addendum)
We discussed reasons for his poor compliance with CPAP.  We will provide him with a simpler type of mask, example AirFit N 20. He complains of dryness of mouth and we will provide him with a chinstrap since he is probably a mouth breather. Otherwise I have asked him to show compliance on his CPAP for a 30-day duration.  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 We will also reassess him with a home sleep test to reassess the degree of his OSA  Weight loss encouraged, compliance with goal of at least 4-6 hrs every night is the expectation. Advised against medications with sedative side effects Cautioned against driving when sleepy - understanding that sleepiness will vary on a day to day basis

## 2021-03-19 NOTE — Patient Instructions (Signed)
X Schedule HST  X Get back on CPAP & use nightly until next visit  X Rx for new nasal mask airfit N 20  + chin strap

## 2021-03-19 NOTE — Progress Notes (Signed)
Subjective:    Patient ID: Matthew Keller, male    DOB: 1950-04-19, 70 y.o.   MRN: [id number]  HPI  70 yo male presents to reestablish care for OSA  PMH -insulin requiring DM/HTN /Hyperlipidemia/Obesity  Drives for Holiday tours  Chief Complaint  Patient presents with   Follow-up    Follow up. Patient says cpap machine has been giving him trouble.    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  OSA was diagnosed in 2009.  He had mild OSA but severe during REM sleep and we decided to treat.  He had good improvement of his daytime somnolence and fatigue with using CPAP machine.  CPAP was set at 11 cm  with a nasal mask but has been intermittently compliant. Weight has remained steady at 280 pounds. He admits to decreased use of his CPAP machine and this was confirmed by download which shows decreased usage but good control of events on 11 cm He denies excessive daytime somnolence and is able to drive 6 to 8 hours without any problems.  Epworth sleepiness score is 6 Bedtime is between 11 and midnight, sleep latency is 20 to 30 minutes he sleeps on his side with 2 pillows reports 1-2 nocturnal awakenings including nocturia and is out of bed latest by 8:30 AM feeling rested without dryness of mouth or headaches There is no history suggestive of cataplexy, sleep paralysis or parasomnias     Significant tests/ events reviewed  4 /2009 NPSG showed obstructive sleep apnea with AHI 12/h, increases during REM to 50/h, lowest desaturation 75% c/w severe OSA.    Past Medical History:  Diagnosis Date   Atypical chest pain    LHC 08-03-01 nl coronaries and lv fn, lvedp 30.  cardiac workup by Dr. Harrington Challenger 5/09 neg cardiolite for ischemia, rec rsik reduction   Chicken pox    Diverticulosis    colonoscopy 9/01........Marland KitchenDr. Deatra Ina   DM (diabetes mellitus) [facility])    GERD (gastroesophageal reflux disease)    Healthcare maintenance    pneumovax 09/2006, Td 04/2004, CPX June 12, 2010   Hypertension     Microcytic anemia    Morbid obesity ([place])    all time high 310 2009.  target wt = 208 for BMI <30.  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)   Renal insufficiency    baseline 1.6 October 24, 2009 > 1.3 June 12, 2010   Sleep apnea    Thyroid disease     Past Surgical History:  Procedure Laterality Date   KNEE SURGERY     TOTAL KNEE ARTHROPLASTY Right 08/10/2019   Procedure: RIGHT TOTAL KNEE ARTHROPLASTY;  Surgeon: Melrose Nakayama, MD;  Location: [facility];  Service: Orthopedics;  Laterality: Right;     No Known Allergies    Social History   Socioeconomic History   Marital status: Married    Spouse name: Not on file   Number of children: 2   Years of education: 18   Highest education level: Not on file  Occupational History   Occupation: Recruitment consultant for [employer]: [employer]  Tobacco Use   Smoking status: Former    Packs/day: 0.30    Years: 1.00    Pack years: 0.30    Types: Cigarettes    Quit date: 04/08/1985    Years since  quitting: 35.9   Smokeless tobacco: Never  Vaping Use   Vaping Use: Never used  Substance and Sexual Activity   Alcohol use: Yes    Comment: occasional   Drug use: No   Sexual activity: Yes  Other Topics Concern   Not on file  Social History Narrative   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    Social Determinants of Health   Financial Resource Strain: Low Risk    Difficulty of Paying Living Expenses: Not hard at all  Food Insecurity: No Food Insecurity   Worried About Charity fundraiser in the Last Year: Never true   [place] in the Last Year: Never true  Transportation Needs: No Transportation Needs   Lack of Transportation (Medical): No   Lack of Transportation  (Non-Medical): No  Physical Activity: Sufficiently Active   Days of Exercise per Week: 5 days   Minutes of Exercise per Session: 30 min  Stress: No Stress Concern Present   Feeling of Stress : Not at all  Social Connections: Socially Integrated   Frequency of Communication with Friends and Family: More than three times a week   Frequency of Social Gatherings with Friends and Family: More than three times a week   Attends Religious Services: More than 4 times per year   Active Member of [employer] or Organizations: Yes   Attends Music therapist: More than 4 times per year   Marital Status: Married  Human resources officer Violence: Not At Risk   Fear of Current or Ex-Partner: No   Emotionally Abused: No   Physically Abused: No   Sexually Abused: No    Family History  Problem Relation Age of Onset   Diabetes Mother    Alcohol abuse Father    Throat cancer Brother        smoker   Alcohol abuse Brother    Colon cancer Neg Hx    Esophageal cancer Neg Hx    Liver cancer Neg Hx    Pancreatic cancer Neg Hx    Rectal cancer Neg Hx    Stomach cancer Neg Hx      Review of Systems Constitutional: negative for anorexia, fevers and sweats  Eyes: negative for irritation, redness and visual disturbance  Ears, nose, mouth, throat, and face: negative for earaches, epistaxis, nasal congestion and sore throat  Respiratory: negative for cough, dyspnea on exertion, sputum and wheezing  Cardiovascular: negative for chest pain, dyspnea, lower extremity edema, orthopnea, palpitations and syncope  Gastrointestinal: negative for abdominal pain, constipation, diarrhea, melena, nausea and vomiting  Genitourinary:negative for dysuria, frequency and hematuria  Hematologic/lymphatic: negative for bleeding, easy bruising and lymphadenopathy  Musculoskeletal:negative for arthralgias, muscle weakness and stiff joints  Neurological: negative for coordination problems, gait problems, headaches and  weakness  Endocrine: negative for diabetic symptoms including polydipsia, polyuria and weight loss     Objective:   Physical Exam  Gen. Pleasant, elderly,obese, in no distress, normal affect ENT - no pallor,icterus, no post nasal drip, class 2-3 airway Neck: No JVD, no thyromegaly, no carotid bruits Lungs: no use of accessory muscles, no dullness to percussion, decreased without rales or rhonchi  Cardiovascular: Rhythm regular, heart sounds  normal, no murmurs or gallops, no peripheral edema Abdomen: soft and non-tender, no hepatosplenomegaly, BS normal. Musculoskeletal: No deformities, no cyanosis or clubbing Neuro:  alert,  non focal, no tremors       Assessment & Plan:

## 2021-04-13 ENCOUNTER — Telehealth: Payer: Self-pay | Admitting: Pulmonary Disease

## 2021-04-16 NOTE — Telephone Encounter (Signed)
Called the pt and there was no answer- LMTCB    

## 2021-04-18 NOTE — Telephone Encounter (Signed)
Call made to patient, confirmed DOB. Patient wanted to know when he will get his new Cpap mask and when he will get his sleep study scheduled.   Call made to [facility], spoke with Wells Guiles. The order was put in and processed. It will get shipped out today. He should receive within 48-72 hours.   [facility] do we have an estimate of when patient can be scheduled for his Sleep Study. Thanks :)

## 2021-04-18 NOTE — Telephone Encounter (Signed)
Currently HST are about 12 weeks out from the date it was ordered for scheduling.

## 2021-04-18 NOTE — Telephone Encounter (Signed)
Called and spoke with pt letting him know the info from [place] about the mask and he verbalized understanding. Also let him know that we are about 10-12 weeks out in getting pts scheduled for HSTs and stated to him as soon as able, our PCCs would get him in for appt and he verbalized understanding. Stated to him to continue to use his current machine until able to receive new one. Nothing further needed.

## 2021-04-18 NOTE — Telephone Encounter (Signed)
Attempted to call pt but unable to reach. Left message for him to return call. °

## 2021-05-10 ENCOUNTER — Other Ambulatory Visit: Payer: Self-pay | Admitting: Internal Medicine

## 2021-05-10 NOTE — Telephone Encounter (Signed)
Please refill as per office routine med refill policy (all routine meds to be refilled for 3 mo or monthly (per pt preference) up to one year from last visit, then month to month grace period for 3 mo, then further med refills will have to be denied) ? ?

## 2021-05-15 ENCOUNTER — Ambulatory Visit: Payer: Medicare PPO

## 2021-05-15 ENCOUNTER — Other Ambulatory Visit: Payer: Self-pay

## 2021-05-15 DIAGNOSIS — G4733 Obstructive sleep apnea (adult) (pediatric): Secondary | ICD-10-CM | POA: Diagnosis not present

## 2021-05-18 ENCOUNTER — Telehealth: Payer: Self-pay | Admitting: Pulmonary Disease

## 2021-05-18 DIAGNOSIS — G4733 Obstructive sleep apnea (adult) (pediatric): Secondary | ICD-10-CM | POA: Diagnosis not present

## 2021-05-18 NOTE — Telephone Encounter (Signed)
HST showed very mild  OSA with AHI 8/ hr Continue CPAP or OV to reassess

## 2021-05-21 ENCOUNTER — Encounter: Payer: Self-pay | Admitting: Adult Health

## 2021-05-21 ENCOUNTER — Ambulatory Visit (INDEPENDENT_AMBULATORY_CARE_PROVIDER_SITE_OTHER): Payer: Medicare PPO | Admitting: Adult Health

## 2021-05-21 ENCOUNTER — Encounter: Payer: Self-pay | Admitting: *Deleted

## 2021-05-21 ENCOUNTER — Other Ambulatory Visit: Payer: Self-pay

## 2021-05-21 DIAGNOSIS — G4733 Obstructive sleep apnea (adult) (pediatric): Secondary | ICD-10-CM

## 2021-05-21 NOTE — Patient Instructions (Signed)
Wear CPAP At bedtime   Keep up good work.  Need to wear for at least 4-6 hr each night.  Remain active.  Follow up in 1 year with Dr. [name]  or Iola Turri NP And As needed

## 2021-05-21 NOTE — Assessment & Plan Note (Signed)
Mild obstructive sleep apnea with perceived clinical benefit. Patient is continue on CPAP at bedtime.  Patient education given.  Plan Patient Instructions  Wear CPAP At bedtime   Keep up good work.  Need to wear for at least 4-6 hr each night.  Remain active.  Follow up in 1 year with Dr. [name]  or Kiasia Chou NP And As needed     '

## 2021-05-21 NOTE — Progress Notes (Signed)
@Patient  ID: [name], male    DOB: 08-20-1950, 71 y.o.   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   HST 05/2021 Mild OSA AHI 8/hr, SPO2 low at 79%  05/21/2021 Follow up : OSA  Patient presents for follow-up visit for sleep apnea.  Patient has underlying mild obstructive sleep apnea.  He has been on CPAP For many years.  Recently had a repeat home sleep study that was done earlier this month that showed mild obstructive sleep apnea with AHI at 8/hour and SPO2 low at 79%.  We discussed his sleep study results.  Went over ongoing treatment options including weight loss, positional sleep and CPAP.  Patient does drive tour buses.  Does feel that the CPAP helps with his daytime sleepiness.  Feels that he benefits from CPAP. Patient says he has been trying to wear his CPAP each night.  Usually gets in about 6 or 7 hours of sleep.  CPAP download shows excellent compliance with daily average usage at 6 hours.  Patient is on CPAP 11 cm H2O.  AHI 1.8/hour.  Patient says he tries to remain active.  Goes to the gym.  Also active in his church.    No Known Allergies  Immunization History  Administered Date(s) Administered   Fluad Quad(high Dose 65+) 12/16/2018, 02/08/2020, 03/07/2021   Influenza Whole 01/06/2009   Influenza, High Dose Seasonal PF 06/21/2015, 01/06/2017, 12/31/2017   Influenza-Unspecified 01/06/2014   PFIZER Comirnaty(Gray Top)Covid-19 Tri-Sucrose Vaccine 07/21/2020   PFIZER(Purple Top)SARS-COV-2 Vaccination 04/28/2019, 05/17/2019, 12/14/2019   PNEUMOCOCCAL CONJUGATE-20 03/07/2021   Pfizer Covid-19 Vaccine Bivalent Booster 73yr & up 03/16/2021    Pneumococcal Conjugate-13 11/07/2014   Pneumococcal Polysaccharide-23 11/17/2013   Tdap 08/01/2014   Zoster Recombinat (Shingrix) 08/02/2019    Past Medical History:  Diagnosis Date   Atypical chest pain    LHC 08-03-01 nl coronaries and lv fn, lvedp 30.  cardiac workup by Dr. RHarrington Challenger5/09 neg cardiolite for ischemia, rec rsik reduction   Chicken pox    Diverticulosis    colonoscopy 9/01.........Marland Kitchenr. KDeatra Ina  DM [facility]diabetes mellitus) [facility]    GERD (gastroesophageal reflux disease)    Healthcare maintenance    pneumovax 09/2006, Td 04/2004, CPX June 12, 2010   Hypertension    Microcytic anemia    [place]bid [place] ([place]    all time high 310 2009.  target wt = 208 for BMI <30.  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      aspirin 81 MG chewable tablet Chew 1 tablet (81 mg total) by mouth 2 (two) times daily. 30 tablet 0   Blood Glucose Monitoring Suppl (ONETOUCH VERIO IQ SYSTEM) w/Device KIT Use to check sugars 1-2 times per day 1 kit 0   Cholecalciferol 50 MCG (2000 UT) TABS 1 tab by mouth once daily 30 tablet 99   empagliflozin (JARDIANCE) 25 MG TABS tablet TAKE 1 TABLET BY MOUTH EVERY DAY 90 tablet 3   furosemide (LASIX) 20 MG tablet TAKE 1 TABLET BY MOUTH EVERY DAY 90 tablet 2   glipiZIDE (GLUCOTROL XL) 2.5 MG 24 hr tablet  TAKE 1 TABLET BY MOUTH EVERY DAY WITH BREAKFAST 90 tablet 1   glucose blood (ONETOUCH VERIO) test strip Use to check sugars 1-2 times per day 100 each 12   HYDROcodone-acetaminophen (NORCO/VICODIN) 5-325 MG tablet Take 1-2 tablets by mouth every 6 (six) hours as needed for moderate pain or severe pain (post op pain). 40 tablet 0   Lancets (ONETOUCH ULTRASOFT) lancets Use as instructed 100 each 12   latanoprost (XALATAN) 0.005 % ophthalmic solution Place 1 drop into both eyes at bedtime.      levothyroxine (SYNTHROID) 75 MCG tablet TAKE 1 TABLET BY MOUTH EVERY DAY 90 tablet 2   losartan (COZAAR) 100 MG tablet TAKE 1 TABLET DAILY. ANNUAL APPT DUE IN MAY MUST SEE PROVIDER FOR FUTURE REFILLS 90 tablet 0   metFORMIN (GLUCOPHAGE-XR) 500 MG 24 hr tablet TAKE 1 TABLET BY MOUTH EVERY DAY WITH BREAKFAST 90 tablet 2   metoprolol succinate (TOPROL-XL) 25 MG 24 hr tablet TAKE 1 TABLET BY MOUTH EVERY DAY 90 tablet 2   minoxidil (LONITEN) 10 MG tablet TAKE 1 TAB IN THE MORNING & 1/2 TAB IN THE EVENING 135 tablet 1   omeprazole (PRILOSEC) 40 MG capsule TAKE 1 CAPSULE BY MOUTH EVERY DAY 90 capsule 2   rosuvastatin (CRESTOR) 10 MG tablet TAKE 1 TABLET BY MOUTH EVERY DAY 90 tablet 3   TRULICITY 1.5 HD/6.2IW SOPN INJECT 1.5 MG INTO THE SKIN ONCE A WEEK. 6 mL 2   verapamil (CALAN-SR) 240 MG CR tablet TAKE 1 TABLET (240 MG TOTAL) BY MOUTH DAILY 90 tablet 1   Vitamin D, Ergocalciferol, (DRISDOL) 1.25 MG (50000 UNIT) CAPS capsule Take 1 capsule (50,000 Units total) by mouth every 7 (seven) days. 12 capsule 0   [employer] VITAMIN B12 1000 MCG tablet TAKE 1 TABLET BY MOUTH EVERY DAY (Patient not taking: Reported on 05/21/2021) 30 tablet 0   sildenafil (VIAGRA) 100 MG tablet Take 1 tablet (100 mg total) by mouth daily as needed for erectile dysfunction. (Patient not taking: Reported on 05/21/2021) 9 tablet 0   COVID-19 mRNA Vac-TriS, Pfizer, (PFIZER-BIONT COVID-19 VAC-TRIS) SUSP injection Inject into the muscle. 0.3 mL 0   No  facility-administered medications prior to visit.     Review of Systems:   Constitutional:   No  weight loss, night sweats,  Fevers, chills, fatigue, or  lassitude.  HEENT:   No headaches,  Difficulty swallowing,  Tooth/dental problems, or  Sore throat,                No sneezing, itching, ear ache, nasal congestion, post nasal drip,   CV:  No chest pain,  Orthopnea, PND, swelling in lower extremities, anasarca, dizziness, palpitations, syncope.   GI  No heartburn, indigestion, abdominal pain, nausea, vomiting, diarrhea, change in bowel habits, loss  of appetite, bloody stools.   Resp: No shortness of breath with exertion or at rest.  No excess mucus, no productive cough,  No non-productive cough,  No coughing up of blood.  No change in color of mucus.  No wheezing.  No chest wall deformity  Skin: no rash or lesions.  GU: no dysuria, change in color of urine, no urgency or frequency.  No flank pain, no hematuria   MS:  No joint pain or swelling.  No decreased range of motion.  No back pain.    Physical Exam  BP (!) 150/90 (BP Location: Left Arm, Patient Position: Sitting, Cuff Size: Large)    Pulse 69    Temp 97.7 F (36.5 C) (Oral)    Ht 5' 11"  (1.803 m)    Wt 281 lb 9.6 oz (127.7 kg)    SpO2 97% Comment: RA   BMI 39.28 kg/m   GEN: A/Ox3; pleasant , NAD, well nourished    HEENT:  [place]/AT,  NOSE-clear, THROAT-clear, no lesions, no postnasal drip or exudate noted.   NECK:  Supple w/ fair ROM; no JVD; normal carotid impulses w/o bruits; no thyromegaly or nodules palpated; no lymphadenopathy.    RESP  Clear  P & A; w/o, wheezes/ rales/ or rhonchi. no accessory muscle use, no dullness to percussion  CARD:  RRR, no m/r/g, tr  peripheral edema, pulses intact, no cyanosis or clubbing.  GI:   Soft & nt; nml bowel sounds; no organomegaly or masses detected.   Musco: Warm bil, no deformities or joint swelling noted.   Neuro: alert, no focal deficits noted.    Skin: Warm, no lesions  or rashes    Lab Results:    BMET   BNP No results found for: BNP  ProBNP   Imaging: No results found.    No flowsheet data found.  No results found for: NITRICOXIDE      Assessment & Plan:   Obstructive sleep apnea Mild obstructive sleep apnea with perceived clinical benefit. Patient is continue on CPAP at bedtime.  Patient education given.  Plan Patient Instructions  Wear CPAP At bedtime   Keep up good work.  Need to wear for at least 4-6 hr each night.  Remain active.  Follow up in 1 year with Dr. [name]  or Renn Stille NP And As needed     '  Morbid obesity ([place]) Healthy weight loss discussed     Rexene Edison, NP 05/21/2021

## 2021-05-21 NOTE — Assessment & Plan Note (Signed)
Healthy weight loss discussed 

## 2021-05-22 NOTE — Telephone Encounter (Signed)
Patient is returning phone call. Patient phone number is [phone].

## 2021-05-22 NOTE — Telephone Encounter (Signed)
I called the patient and he voices understanding and he did not have any questions.

## 2021-05-23 ENCOUNTER — Telehealth: Payer: Self-pay | Admitting: Adult Health

## 2021-05-23 NOTE — Telephone Encounter (Signed)
Printed out a 90 day cpap compliance report and faxed the number the patient provided. I have not received a confirmation yet.   Called and spoke with patient. He is aware that the fax has been sent.   Nothing further needed at time of call.

## 2021-05-23 NOTE — Telephone Encounter (Signed)
I called the patient and he reports that he is going to pick up the paper in the office and I placed a copy up front. Nothing further needed.

## 2021-06-26 DIAGNOSIS — M79672 Pain in left foot: Secondary | ICD-10-CM | POA: Diagnosis not present

## 2021-06-26 DIAGNOSIS — M79671 Pain in right foot: Secondary | ICD-10-CM | POA: Diagnosis not present

## 2021-06-26 DIAGNOSIS — M21621 Bunionette of right foot: Secondary | ICD-10-CM | POA: Diagnosis not present

## 2021-06-26 DIAGNOSIS — M21622 Bunionette of left foot: Secondary | ICD-10-CM | POA: Diagnosis not present

## 2021-06-26 DIAGNOSIS — B079 Viral wart, unspecified: Secondary | ICD-10-CM | POA: Diagnosis not present

## 2021-07-16 DIAGNOSIS — B079 Viral wart, unspecified: Secondary | ICD-10-CM | POA: Diagnosis not present

## 2021-08-09 ENCOUNTER — Other Ambulatory Visit: Payer: Self-pay | Admitting: Internal Medicine

## 2021-08-09 NOTE — Telephone Encounter (Signed)
Please refill as per office routine med refill policy (all routine meds to be refilled for 3 mo or monthly (per pt preference) up to one year from last visit, then month to month grace period for 3 mo, then further med refills will have to be denied) ? ?

## 2021-08-13 ENCOUNTER — Other Ambulatory Visit: Payer: Self-pay | Admitting: Cardiovascular Disease

## 2021-09-15 ENCOUNTER — Other Ambulatory Visit: Payer: Self-pay | Admitting: Internal Medicine

## 2021-09-15 NOTE — Telephone Encounter (Signed)
Please refill as per office routine med refill policy (all routine meds to be refilled for 3 mo or monthly (per pt preference) up to one year from last visit, then month to month grace period for 3 mo, then further med refills will have to be denied) ? ?

## 2021-09-17 DIAGNOSIS — H25013 Cortical age-related cataract, bilateral: Secondary | ICD-10-CM | POA: Diagnosis not present

## 2021-09-17 DIAGNOSIS — H40023 Open angle with borderline findings, high risk, bilateral: Secondary | ICD-10-CM | POA: Diagnosis not present

## 2021-09-17 DIAGNOSIS — E119 Type 2 diabetes mellitus without complications: Secondary | ICD-10-CM | POA: Diagnosis not present

## 2021-09-17 DIAGNOSIS — H2513 Age-related nuclear cataract, bilateral: Secondary | ICD-10-CM | POA: Diagnosis not present

## 2021-09-25 ENCOUNTER — Other Ambulatory Visit: Payer: Self-pay | Admitting: Internal Medicine

## 2021-10-12 ENCOUNTER — Telehealth: Payer: Self-pay | Admitting: Internal Medicine

## 2021-10-12 NOTE — Telephone Encounter (Signed)
Left message for patient to call back to schedule Medicare Annual Wellness Visit   Last AWV  10/13/20  Please schedule at anytime with [facility] if patient calls the office back.      Any questions, please call me at [phone]

## 2021-11-01 ENCOUNTER — Encounter: Payer: Self-pay | Admitting: Internal Medicine

## 2021-11-01 ENCOUNTER — Ambulatory Visit (INDEPENDENT_AMBULATORY_CARE_PROVIDER_SITE_OTHER): Payer: Medicare PPO | Admitting: Internal Medicine

## 2021-11-01 VITALS — BP 152/80 | HR 81 | Temp 98.1°F | Ht 71.0 in | Wt 281.0 lb

## 2021-11-01 DIAGNOSIS — Z0001 Encounter for general adult medical examination with abnormal findings: Secondary | ICD-10-CM

## 2021-11-01 DIAGNOSIS — E7800 Pure hypercholesterolemia, unspecified: Secondary | ICD-10-CM | POA: Diagnosis not present

## 2021-11-01 DIAGNOSIS — I10 Essential (primary) hypertension: Secondary | ICD-10-CM | POA: Diagnosis not present

## 2021-11-01 DIAGNOSIS — Z125 Encounter for screening for malignant neoplasm of prostate: Secondary | ICD-10-CM | POA: Diagnosis not present

## 2021-11-01 DIAGNOSIS — N1831 Chronic kidney disease, stage 3a: Secondary | ICD-10-CM

## 2021-11-01 DIAGNOSIS — E1159 Type 2 diabetes mellitus with other circulatory complications: Secondary | ICD-10-CM | POA: Diagnosis not present

## 2021-11-01 DIAGNOSIS — E559 Vitamin D deficiency, unspecified: Secondary | ICD-10-CM

## 2021-11-01 DIAGNOSIS — E538 Deficiency of other specified B group vitamins: Secondary | ICD-10-CM | POA: Diagnosis not present

## 2021-11-01 DIAGNOSIS — E1165 Type 2 diabetes mellitus with hyperglycemia: Secondary | ICD-10-CM | POA: Diagnosis not present

## 2021-11-01 DIAGNOSIS — E039 Hypothyroidism, unspecified: Secondary | ICD-10-CM

## 2021-11-01 LAB — BASIC METABOLIC PANEL
BUN: 14 mg/dL (ref 6–23)
CO2: 30 mEq/L (ref 19–32)
Calcium: 10 mg/dL (ref 8.4–10.5)
Chloride: 105 mEq/L (ref 96–112)
Creatinine, Ser: 1.45 mg/dL (ref 0.40–1.50)
GFR: 48.1 mL/min — ABNORMAL LOW (ref >60.00)
Glucose, Bld: 100 mg/dL — ABNORMAL HIGH (ref 70–99)
Potassium: 4.2 mEq/L (ref 3.5–5.1)
Sodium: 141 mEq/L (ref 135–145)

## 2021-11-01 LAB — CBC WITH DIFFERENTIAL/PLATELET
Basophils Absolute: 0 10*3/uL (ref 0.0–0.1)
Basophils Relative: 0.4 % (ref 0.0–3.0)
Eosinophils Absolute: 0.1 10*3/uL (ref 0.0–0.7)
Eosinophils Relative: 2.2 % (ref 0.0–5.0)
HCT: 42.1 % (ref 39.0–52.0)
Hemoglobin: 14 g/dL (ref 13.0–17.0)
Lymphocytes Relative: 22.2 % (ref 12.0–46.0)
Lymphs Abs: 1.5 10*3/uL (ref 0.7–4.0)
MCHC: 33.2 g/dL (ref 30.0–36.0)
MCV: 74.9 fl — ABNORMAL LOW (ref 78.0–100.0)
Monocytes Absolute: 0.5 10*3/uL (ref 0.1–1.0)
Monocytes Relative: 7.3 % (ref 3.0–12.0)
Neutro Abs: 4.5 10*3/uL (ref 1.4–7.7)
Neutrophils Relative %: 67.9 % (ref 43.0–77.0)
Platelets: 205 10*3/uL (ref 150.0–400.0)
RBC: 5.62 Mil/uL (ref 4.22–5.81)
RDW: 16.3 % — ABNORMAL HIGH (ref 11.5–15.5)
WBC: 6.7 10*3/uL (ref 4.0–10.5)

## 2021-11-01 LAB — URINALYSIS, ROUTINE W REFLEX MICROSCOPIC
Bilirubin Urine: NEGATIVE
Hgb urine dipstick: NEGATIVE
Ketones, ur: NEGATIVE
Leukocytes,Ua: NEGATIVE
Nitrite: NEGATIVE
RBC / HPF: NONE SEEN (ref 0–?)
Specific Gravity, Urine: 1.01 (ref 1.000–1.030)
Total Protein, Urine: NEGATIVE
Urine Glucose: >=1000 — AB
Urobilinogen, UA: 0.2 (ref 0.0–1.0)
pH: 6.5 (ref 5.0–8.0)

## 2021-11-01 LAB — TSH: TSH: 3.29 u[IU]/mL (ref 0.35–5.50)

## 2021-11-01 LAB — LIPID PANEL
Cholesterol: 122 mg/dL (ref 0–200)
HDL: 42.3 mg/dL (ref >39.00)
LDL Cholesterol: 67 mg/dL (ref 0–99)
NonHDL: 79.92
Total CHOL/HDL Ratio: 3
Triglycerides: 64 mg/dL (ref 0.0–149.0)
VLDL: 12.8 mg/dL (ref 0.0–40.0)

## 2021-11-01 LAB — HEPATIC FUNCTION PANEL
ALT: 16 U/L (ref 0–53)
AST: 20 U/L (ref 0–37)
Albumin: 4.2 g/dL (ref 3.5–5.2)
Alkaline Phosphatase: 80 U/L (ref 39–117)
Bilirubin, Direct: 0.2 mg/dL (ref 0.0–0.3)
Total Bilirubin: 0.9 mg/dL (ref 0.2–1.2)
Total Protein: 7.2 g/dL (ref 6.0–8.3)

## 2021-11-01 LAB — MICROALBUMIN / CREATININE URINE RATIO
Creatinine,U: 34 mg/dL
Microalb Creat Ratio: 2.1 mg/g (ref 0.0–30.0)
Microalb, Ur: <0.7 mg/dL (ref 0.0–1.9)

## 2021-11-01 LAB — VITAMIN B12: Vitamin B-12: 337 pg/mL (ref 211–911)

## 2021-11-01 LAB — VITAMIN D 25 HYDROXY (VIT D DEFICIENCY, FRACTURES): VITD: 26.52 ng/mL — ABNORMAL LOW (ref 30.00–100.00)

## 2021-11-01 LAB — PSA: PSA: 0.23 ng/mL (ref 0.10–4.00)

## 2021-11-01 LAB — HEMOGLOBIN A1C: Hgb A1c MFr Bld: 6.9 % — ABNORMAL HIGH (ref 4.6–6.5)

## 2021-11-01 MED ORDER — METFORMIN HCL ER 500 MG PO TB24: ORAL_TABLET | ORAL | 3 refills | Status: DC

## 2021-11-01 MED ORDER — METOPROLOL SUCCINATE ER 100 MG PO TB24: 100.0000 mg | ORAL_TABLET | Freq: Every day | ORAL | 3 refills | Status: DC

## 2021-11-01 MED ORDER — ROSUVASTATIN CALCIUM 10 MG PO TABS
10.0000 mg | ORAL_TABLET | Freq: Every day | ORAL | 3 refills | Status: DC
Start: 2021-11-01 — End: 2022-12-02

## 2021-11-01 MED ORDER — MINOXIDIL 10 MG PO TABS: ORAL_TABLET | ORAL | 3 refills | Status: DC

## 2021-11-01 MED ORDER — LOSARTAN POTASSIUM 100 MG PO TABS: ORAL_TABLET | ORAL | 3 refills | Status: DC

## 2021-11-01 MED ORDER — EMPAGLIFLOZIN 25 MG PO TABS
25.0000 mg | ORAL_TABLET | Freq: Every day | ORAL | 3 refills | Status: DC
Start: 2021-11-01 — End: 2022-12-02

## 2021-11-01 MED ORDER — TRULICITY 1.5 MG/0.5ML ~~LOC~~ SOAJ
1.5000 mg | SUBCUTANEOUS | 3 refills | Status: DC
Start: 2021-11-01 — End: 2022-04-30

## 2021-11-01 MED ORDER — OMEPRAZOLE 40 MG PO CPDR
DELAYED_RELEASE_CAPSULE | ORAL | 3 refills | Status: DC
Start: 2021-11-01 — End: 2022-12-02

## 2021-11-01 MED ORDER — LEVOTHYROXINE SODIUM 75 MCG PO TABS: 75.0000 ug | ORAL_TABLET | Freq: Every day | ORAL | 3 refills | Status: DC

## 2021-11-01 MED ORDER — GLIPIZIDE ER 2.5 MG PO TB24: 2.5000 mg | ORAL_TABLET | Freq: Every day | ORAL | 3 refills | Status: DC

## 2021-11-01 MED ORDER — VERAPAMIL HCL ER 240 MG PO TBCR
240.0000 mg | EXTENDED_RELEASE_TABLET | Freq: Every day | ORAL | 3 refills | Status: DC
Start: 2021-11-01 — End: 2023-03-10

## 2021-11-01 MED ORDER — FUROSEMIDE 20 MG PO TABS: 20.0000 mg | ORAL_TABLET | Freq: Every day | ORAL | 3 refills | Status: DC

## 2021-11-01 NOTE — Assessment & Plan Note (Signed)
Last vitamin D Lab Results  Component Value Date   VD25OH 33 10/13/2020   Low, to start oral replacement

## 2021-11-01 NOTE — Progress Notes (Signed)
Patient ID: Matthew Keller, male   DOB: [birth year], 71 y.o.   MRN: [id number]         Chief Complaint:: wellness exam and htn, dm, low vit d       HPI:  Matthew Keller is a 71 y.o. male here for wellness exam; up to date                Also Pt denies chest pain, increased sob or doe, wheezing, orthopnea, PND, increased LE swelling, palpitations, dizziness or syncope.   Pt denies polydipsia, polyuria, or new focal neuro s/s.    Pt denies fever, wt loss, night sweats, loss of appetite, or other constitutional symptoms  Wt had been to [address] with more gym and golf, but just returned from cruise.   Wt Readings from Last 3 Encounters:  11/01/21 281 lb (127.5 kg)  05/21/21 281 lb 9.6 oz (127.7 kg)  03/19/21 281 lb 3.2 oz (127.6 kg)   BP Readings from Last 3 Encounters:  11/01/21 (!) 152/80  05/21/21 (!) 150/90  03/19/21 (!) 154/96   Immunization History  Administered Date(s) Administered   Fluad Quad(high Dose 65+) 12/16/2018, 02/08/2020, 03/07/2021   Influenza Whole 01/06/2009   Influenza, High Dose Seasonal PF 06/21/2015, 01/06/2017, 12/31/2017   Influenza-Unspecified 01/06/2014   PFIZER Comirnaty(Gray Top)Covid-19 Tri-Sucrose Vaccine 07/21/2020   PFIZER(Purple Top)SARS-COV-2 Vaccination 04/28/2019, 05/17/2019, 12/14/2019   PNEUMOCOCCAL CONJUGATE-20 03/07/2021   Pfizer Covid-19 Vaccine Bivalent Booster 55yr & up 03/16/2021   Pneumococcal Conjugate-13 11/07/2014   Pneumococcal Polysaccharide-23 11/17/2013   Tdap 08/01/2014   Zoster Recombinat (Shingrix) 08/02/2019   There are no preventive care reminders to display for this patient.     Past Medical History:  Diagnosis Date   Atypical chest pain    [facility] 08-03-01 nl coronaries and lv fn, lvedp 30.  cardiac workup by Dr. RHarrington Challenger5/09 neg cardiolite for ischemia, rec rsik reduction   Chicken pox    Diverticulosis    colonoscopy 9/01.........Marland Kitchenr. KDeatra Ina  DM (diabetes mellitus) ([facility]    GERD (gastroesophageal reflux disease)    Healthcare  maintenance    pneumovax 09/2006, Td 04/2004, CPX June 12, 2010   Hypertension    Microcytic anemia    Morbid obesity ([place]    all time high 310 2009.  target wt = 208 for BMI <30.  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 His smoking use included cigarettes. He has a 0.30 pack-year smoking history. He has never used smokeless tobacco. He reports current alcohol use. He reports that he does not use drugs. family history includes Alcohol abuse in his brother and father; Diabetes in his mother; Throat cancer in his brother. No Known Allergies Current Outpatient Medications on File Prior to Visit  Medication Sig Dispense Refill   acetaminophen (TYLENOL) 325 MG tablet Take 650 mg by mouth every 6 (six) hours as needed for moderate pain.  aspirin 81 MG chewable tablet Chew 1 tablet (81 mg total) by mouth 2 (two) times daily. 30 tablet 0   Blood Glucose Monitoring Suppl (ONETOUCH VERIO IQ SYSTEM) w/Device KIT Use to check sugars 1-2 times per day 1 kit 0   Cholecalciferol 50 MCG (2000 UT) TABS 1 tab by mouth once daily 30 tablet 99   [employer] VITAMIN B12 1000 MCG tablet TAKE 1 TABLET BY MOUTH EVERY DAY 30 tablet 0   glucose blood (ONETOUCH VERIO) test strip Use to check sugars 1-2 times per day 100 each 12   Lancets (ONETOUCH ULTRASOFT) lancets Use as instructed 100 each 12   latanoprost (XALATAN) 0.005 % ophthalmic solution Place 1 drop into both eyes at bedtime.      No  current facility-administered medications on file prior to visit.        ROS:  All others reviewed and negative.  Objective        PE:  BP (!) 152/80 (BP Location: Right Arm, Patient Position: Sitting, Cuff Size: Large)   Pulse 81   Temp 98.1 F (36.7 C) (Oral)   Ht 5' 11" (1.803 m)   Wt 281 lb (127.5 kg)   SpO2 96%   BMI 39.19 kg/m                 Constitutional: Pt appears in NAD               HENT: Head: NCAT.                Right Ear: External ear normal.                 Left Ear: External ear normal.                Eyes: . Pupils are equal, round, and reactive to light. Conjunctivae and EOM are normal               Nose: without d/c or deformity               Neck: Neck supple. Gross normal ROM               Cardiovascular: Normal rate and regular rhythm.                 Pulmonary/Chest: Effort normal and breath sounds without rales or wheezing.                Abd:  Soft, NT, ND, + BS, no organomegaly               Neurological: Pt is alert. At baseline orientation, motor grossly intact               Skin: Skin is warm. No rashes, no other new lesions, LE edema - none               Psychiatric: Pt behavior is normal without agitation   Micro: none  Cardiac tracings I have personally interpreted today:  none  Pertinent Radiological findings (summarize): none   Lab Results  Component Value Date   WBC 6.7 11/01/2021   HGB 14.0 11/01/2021   HCT 42.1 11/01/2021   PLT 205.0 11/01/2021   GLUCOSE 100 (H) 11/01/2021   CHOL 122 11/01/2021   TRIG 64.0 11/01/2021   HDL 42.30 11/01/2021   LDLCALC 67 11/01/2021   ALT 16 11/01/2021   AST 20 11/01/2021   NA 141 11/01/2021   K 4.2 11/01/2021  CL 105 11/01/2021   CREATININE 1.45 11/01/2021   BUN 14 11/01/2021   CO2 30 11/01/2021   TSH 3.29 11/01/2021   PSA 0.23 11/01/2021   INR 1.0 08/03/2019   HGBA1C 6.9 (H) 11/01/2021   MICROALBUR <0.7 11/01/2021   Assessment/Plan:  Matthew Keller is a 71 y.o. Black or African  American [2] male with  has a past medical history of Atypical chest pain, Chicken pox, Diverticulosis, DM (diabetes mellitus) ([place]), GERD (gastroesophageal reflux disease), Healthcare maintenance, Hypertension, Microcytic anemia, Morbid obesity ([place]), OSA (obstructive sleep apnea), Renal insufficiency, Sleep apnea, and Thyroid disease.  Vitamin D deficiency Last vitamin D Lab Results  Component Value Date   VD25OH 33 10/13/2020   Low, to start oral replacement   Encounter for well adult exam with abnormal findings Age and sex appropriate education and counseling updated with regular exercise and diet Referrals for preventative services - none needed Immunizations addressed - none needed Smoking counseling  - none needed Evidence for depression or other mood disorder - none significant Most recent labs reviewed. I have personally reviewed and have noted: 1) the patient's medical and social history 2) The patient's current medications and supplements 3) The patient's height, weight, and BMI have been recorded in the chart   Poorly controlled type 2 diabetes mellitus with circulatory disorder (HCC) Lab Results  Component Value Date   HGBA1C 6.9 (H) 11/01/2021   Stable, pt to continue current medical treatment turlicity 1.5 qwk, jardiacne 25 qd, glucotrol xl 2.5 qd, metformin ER 500 qd    Hypothyroidism Lab Results  Component Value Date   TSH 3.29 11/01/2021   Stable, pt to continue levothyroxine 75 mcg qd  HYPERCHOLESTEROLEMIA Lab Results  Component Value Date   LDLCALC 67 11/01/2021   Stable, pt to continue current statin crestor 10 mg qd   Essential hypertension BP Readings from Last 3 Encounters:  11/01/21 (!) 152/80  05/21/21 (!) 150/90  03/19/21 (!) 154/96   Uncontrolled, for increased toprol XL 100 mg qd, f/u bp at home and next visit   CKD (chronic kidney disease) stage 3, GFR 30-59 ml/min (HCC) Lab Results  Component Value Date   CREATININE 1.45  11/01/2021   Stable overall, cont to avoid nephrotoxins  Followup: Return in about 6 months (around 05/04/2022).  Cathlean Cower, MD 11/03/2021 3:49 PM [facility]

## 2021-11-01 NOTE — Patient Instructions (Signed)
Ok to increase the metoprolol XL to 100 mg per day for blood pressure  Please continue all other medications as before, and refills have been done if requested.  Please have the pharmacy call with any other refills you may need.  Please continue your efforts at being more active, low cholesterol diet, and weight control.  You are otherwise up to date with prevention measures today.  Please keep your appointments with your specialists as you may have planned  Please go to the LAB at the blood drawing area for the tests to be done  You will be contacted by phone if any changes need to be made immediately.  Otherwise, you will receive a letter about your results with an explanation, but please check with MyChart first.  Please remember to sign up for MyChart if you have not done so, as this will be important to you in the future with finding out test results, communicating by private email, and scheduling acute appointments online when needed.  Please make an Appointment to return in 6 months, or sooner if needed, also with Lab Appointment for testing done 3-5 days before at the [facility] (so this is for TWO appointments - please see the scheduling desk as you leave)

## 2021-11-03 ENCOUNTER — Encounter: Payer: Self-pay | Admitting: Internal Medicine

## 2021-11-03 NOTE — Assessment & Plan Note (Signed)
Lab Results  Component Value Date   HGBA1C 6.9 (H) 11/01/2021   Stable, pt to continue current medical treatment turlicity 1.5 qwk, jardiacne 25 qd, glucotrol xl 2.5 qd, metformin ER 500 qd

## 2021-11-03 NOTE — Assessment & Plan Note (Signed)

## 2021-11-03 NOTE — Assessment & Plan Note (Signed)
BP Readings from Last 3 Encounters:  11/01/21 (!) 152/80  05/21/21 (!) 150/90  03/19/21 (!) 154/96   Uncontrolled, for increased toprol XL 100 mg qd, f/u bp at home and next visit

## 2021-11-03 NOTE — Assessment & Plan Note (Signed)
Lab Results  Component Value Date   CREATININE 1.45 11/01/2021   Stable overall, cont to avoid nephrotoxins

## 2021-11-03 NOTE — Assessment & Plan Note (Signed)
Lab Results  Component Value Date   LDLCALC 67 11/01/2021   Stable, pt to continue current statin crestor 10 mg qd

## 2021-11-03 NOTE — Assessment & Plan Note (Signed)
Lab Results  Component Value Date   TSH 3.29 11/01/2021   Stable, pt to continue levothyroxine 75 mcg qd

## 2021-11-05 DIAGNOSIS — G4733 Obstructive sleep apnea (adult) (pediatric): Secondary | ICD-10-CM | POA: Diagnosis not present

## 2021-11-05 DIAGNOSIS — K219 Gastro-esophageal reflux disease without esophagitis: Secondary | ICD-10-CM | POA: Diagnosis not present

## 2021-11-05 DIAGNOSIS — N529 Male erectile dysfunction, unspecified: Secondary | ICD-10-CM | POA: Diagnosis not present

## 2021-11-05 DIAGNOSIS — E785 Hyperlipidemia, unspecified: Secondary | ICD-10-CM | POA: Diagnosis not present

## 2021-11-05 DIAGNOSIS — I10 Essential (primary) hypertension: Secondary | ICD-10-CM | POA: Diagnosis not present

## 2021-11-05 DIAGNOSIS — E039 Hypothyroidism, unspecified: Secondary | ICD-10-CM | POA: Diagnosis not present

## 2021-11-05 DIAGNOSIS — H409 Unspecified glaucoma: Secondary | ICD-10-CM | POA: Diagnosis not present

## 2021-11-05 DIAGNOSIS — E1142 Type 2 diabetes mellitus with diabetic polyneuropathy: Secondary | ICD-10-CM | POA: Diagnosis not present

## 2022-01-07 ENCOUNTER — Ambulatory Visit (INDEPENDENT_AMBULATORY_CARE_PROVIDER_SITE_OTHER): Payer: Medicare PPO

## 2022-01-07 VITALS — Ht 71.0 in | Wt 280.0 lb

## 2022-01-07 DIAGNOSIS — Z0000 Encounter for general adult medical examination without abnormal findings: Secondary | ICD-10-CM

## 2022-01-07 NOTE — Patient Instructions (Signed)
Matthew Keller , Thank you for taking time to come for your Medicare Wellness Visit. I appreciate your ongoing commitment to your health goals. Please review the following plan we discussed and let me know if I can assist you in the future.   These are the goals we discussed:  Goals       Patient Stated      Continue to lose weight by eating healthy and by exercising routinely.       Patient Stated      To lose at least 50 pounds and continue to stay active with exercise, playing golf or working in the yard.      Patient Stated (pt-stated)      To lose 20 pounds by continuing to eat healthy, stay physically active and socially active.        This is a list of the screening recommended for you and due dates:  Health Maintenance  Topic Date Due   Eye exam for diabetics  03/16/2022   Hemoglobin A1C  05/04/2022   Yearly kidney function blood test for diabetes  11/02/2022   Yearly kidney health urinalysis for diabetes  11/02/2022   Complete foot exam   11/02/2022   Tetanus Vaccine  07/31/2024   Colon Cancer Screening  08/23/2027   Pneumonia Vaccine  Completed   Flu Shot  Completed   Hepatitis C Screening: USPSTF Recommendation to screen - Ages 18-79 yo.  Completed   HPV Vaccine  Aged Out   COVID-19 Vaccine  Discontinued   Zoster (Shingles) Vaccine  Discontinued    Advanced directives: Please bring a copy of your health care power of attorney and living will to the office to be added to your chart at your convenience.   Conditions/risks identified: Aim for 30 minutes of exercise or brisk walking, 6-8 glasses of water, and 5 servings of fruits and vegetables each day.   Next appointment: Follow up in one year for your annual wellness visit.   Preventive Care 73 Years and Older, Male  Preventive care refers to lifestyle choices and visits with your health care provider that can promote health and wellness. What does preventive care include? A yearly physical exam. This is also called  an annual well check. Dental exams once or twice a year. Routine eye exams. Ask your health care provider how often you should have your eyes checked. Personal lifestyle choices, including: Daily care of your teeth and gums. Regular physical activity. Eating a healthy diet. Avoiding tobacco and drug use. Limiting alcohol use. Practicing safe sex. Taking low doses of aspirin every day. Taking vitamin and mineral supplements as recommended by your health care provider. What happens during an annual well check? The services and screenings done by your health care provider during your annual well check will depend on your age, overall health, lifestyle risk factors, and family history of disease. Counseling  Your health care provider may ask you questions about your: Alcohol use. Tobacco use. Drug use. Emotional well-being. Home and relationship well-being. Sexual activity. Eating habits. History of falls. Memory and ability to understand (cognition). Work and work Statistician. Screening  You may have the following tests or measurements: Height, weight, and BMI. Blood pressure. Lipid and cholesterol levels. These may be checked every 5 years, or more frequently if you are over 47 years old. Skin check. Lung cancer screening. You may have this screening every year starting at age 26 if you have a 30-pack-year history of smoking and currently smoke or  have quit within the past 15 years. Fecal occult blood test (FOBT) of the stool. You may have this test every year starting at age 3. Flexible sigmoidoscopy or colonoscopy. You may have a sigmoidoscopy every 5 years or a colonoscopy every 10 years starting at age 70. Prostate cancer screening. Recommendations will vary depending on your family history and other risks. Hepatitis C blood test. Hepatitis B blood test. Sexually transmitted disease (STD) testing. Diabetes screening. This is done by checking your blood sugar (glucose) after  you have not eaten for a while (fasting). You may have this done every 1-3 years. Abdominal aortic aneurysm (AAA) screening. You may need this if you are a current or former smoker. Osteoporosis. You may be screened starting at age 92 if you are at high risk. Talk with your health care provider about your test results, treatment options, and if necessary, the need for more tests. Vaccines  Your health care provider may recommend certain vaccines, such as: Influenza vaccine. This is recommended every year. Tetanus, diphtheria, and acellular pertussis (Tdap, Td) vaccine. You may need a Td booster every 10 years. Zoster vaccine. You may need this after age 65. Pneumococcal 13-valent conjugate (PCV13) vaccine. One dose is recommended after age 82. Pneumococcal polysaccharide (PPSV23) vaccine. One dose is recommended after age 79. Talk to your health care provider about which screenings and vaccines you need and how often you need them. This information is not intended to replace advice given to you by your health care provider. Make sure you discuss any questions you have with your health care provider. Document Released: 04/21/2015 Document Revised: 12/13/2015 Document Reviewed: 01/24/2015 Elsevier Interactive Patient Education  2017 [place] Prevention in the Home Falls can cause injuries. They can happen to people of all ages. There are many things you can do to make your home safe and to help prevent falls. What can I do on the outside of my home? Regularly fix the edges of walkways and driveways and fix any cracks. Remove anything that might make you trip as you walk through a door, such as a raised step or threshold. Trim any bushes or trees on the path to your home. Use bright outdoor lighting. Clear any walking paths of anything that might make someone trip, such as rocks or tools. Regularly check to see if handrails are loose or broken. Make sure that both sides of any steps  have handrails. Any raised decks and porches should have guardrails on the edges. Have any leaves, snow, or ice cleared regularly. Use sand or salt on walking paths during winter. Clean up any spills in your garage right away. This includes oil or grease spills. What can I do in the bathroom? Use night lights. Install grab bars by the toilet and in the tub and shower. Do not use towel bars as grab bars. Use non-skid mats or decals in the tub or shower. If you need to sit down in the shower, use a plastic, non-slip stool. Keep the floor dry. Clean up any water that spills on the floor as soon as it happens. Remove soap buildup in the tub or shower regularly. Attach bath mats securely with double-sided non-slip rug tape. Do not have throw rugs and other things on the floor that can make you trip. What can I do in the bedroom? Use night lights. Make sure that you have a light by your bed that is easy to reach. Do not use any sheets or blankets that  are too big for your bed. They should not hang down onto the floor. Have a firm chair that has side arms. You can use this for support while you get dressed. Do not have throw rugs and other things on the floor that can make you trip. What can I do in the kitchen? Clean up any spills right away. Avoid walking on wet floors. Keep items that you use a lot in easy-to-reach places. If you need to reach something above you, use a strong step stool that has a grab bar. Keep electrical cords out of the way. Do not use floor polish or wax that makes floors slippery. If you must use wax, use non-skid floor wax. Do not have throw rugs and other things on the floor that can make you trip. What can I do with my stairs? Do not leave any items on the stairs. Make sure that there are handrails on both sides of the stairs and use them. Fix handrails that are broken or loose. Make sure that handrails are as long as the stairways. Check any carpeting to make  sure that it is firmly attached to the stairs. Fix any carpet that is loose or worn. Avoid having throw rugs at the top or bottom of the stairs. If you do have throw rugs, attach them to the floor with carpet tape. Make sure that you have a light switch at the top of the stairs and the bottom of the stairs. If you do not have them, ask someone to add them for you. What else can I do to help prevent falls? Wear shoes that: Do not have high heels. Have rubber bottoms. Are comfortable and fit you well. Are closed at the toe. Do not wear sandals. If you use a stepladder: Make sure that it is fully opened. Do not climb a closed stepladder. Make sure that both sides of the stepladder are locked into place. Ask someone to hold it for you, if possible. Clearly mark and make sure that you can see: Any grab bars or handrails. First and last steps. Where the edge of each step is. Use tools that help you move around (mobility aids) if they are needed. These include: Canes. Walkers. Scooters. Crutches. Turn on the lights when you go into a dark area. Replace any light bulbs as soon as they burn out. Set up your furniture so you have a clear path. Avoid moving your furniture around. If any of your floors are uneven, fix them. If there are any pets around you, be aware of where they are. Review your medicines with your doctor. Some medicines can make you feel dizzy. This can increase your chance of falling. Ask your doctor what other things that you can do to help prevent falls. This information is not intended to replace advice given to you by your health care provider. Make sure you discuss any questions you have with your health care provider. Document Released: 01/19/2009 Document Revised: 08/31/2015 Document Reviewed: 04/29/2014 Elsevier Interactive Patient Education  2017 [employer].

## 2022-01-07 NOTE — Progress Notes (Signed)
Subjective:   Matthew Keller is a 71 y.o. male who presents for Medicare Annual/Subsequent preventive examination.   Virtual Visit via Telephone Note  I connected with  [name] on 01/07/22 at 11:15 AM EDT by telephone and verified that I am speaking with the correct person using two identifiers.  Location: Patient: home  Provider: [facility]  Persons participating in the virtual visit: patient/Nurse Health Advisor   I discussed the limitations, risks, security and privacy concerns of performing an evaluation and management service by telephone and the availability of in person appointments. The patient expressed understanding and agreed to proceed.  Interactive audio and video telecommunications were attempted between this nurse and patient, however failed, due to patient having technical difficulties OR patient did not have access to video capability.  We continued and completed visit with audio only.  Some vital signs may be absent or patient reported.   [name], LPN  Review of Systems     Cardiac Risk Factors include: advanced age (>3mn, >>66women);diabetes mellitus;dyslipidemia;hypertension;male gender     Objective:    Today's Vitals   01/07/22 1116  Weight: 280 lb (127 kg)  Height: _0  (1.803 m)   Body mass index is 39.05 kg/m.     01/07/2022   11:21 AM 10/13/2020    2:39 PM 08/10/2019    6:00 PM 08/10/2019   10:19 AM 08/03/2019    9:33 AM 07/27/2019   10:42 AM 07/22/2018   10:39 AM  Advanced Directives  Does Patient Have a Medical Advance Directive? Yes Yes No No No No Yes  Type of AParamedicof ACowenLiving will HJeff DavisLiving will     HRafael HernandezLiving will  Does patient want to make changes to medical advance directive?  No - Patient declined       Copy of HStantonin Chart? No - copy requested No - copy requested     No - copy requested  Would patient like  information on creating a medical advance directive?   No - Patient declined No - Patient declined No - Patient declined Yes (ED - Information included in AVS)     Current Medications (verified) Outpatient Encounter Medications as of 01/07/2022  Medication Sig   acetaminophen (TYLENOL) 325 MG tablet Take 650 mg by mouth every 6 (six) hours as needed for moderate pain.    aspirin 81 MG chewable tablet Chew 1 tablet (81 mg total) by mouth 2 (two) times daily.   Blood Glucose Monitoring Suppl (ONETOUCH VERIO IQ SYSTEM) w/Device KIT Use to check sugars 1-2 times per day   Cholecalciferol 50 MCG (2000 UT) TABS 1 tab by mouth once daily   [facility] VITAMIN B12 1000 MCG tablet TAKE 1 TABLET BY MOUTH EVERY DAY   Dulaglutide (TRULICITY) 1.5 MNO/0.3BCSOPN Inject 1.5 mg into the skin once a week.   empagliflozin (JARDIANCE) 25 MG TABS tablet Take 1 tablet (25 mg total) by mouth daily.   furosemide (LASIX) 20 MG tablet Take 1 tablet (20 mg total) by mouth daily.   glipiZIDE (GLUCOTROL XL) 2.5 MG 24 hr tablet Take 1 tablet (2.5 mg total) by mouth daily with breakfast. Patient needs a routine well visit.   glucose blood (ONETOUCH VERIO) test strip Use to check sugars 1-2 times per day   Lancets (ONETOUCH ULTRASOFT) lancets Use as instructed   latanoprost (XALATAN) 0.005 % ophthalmic solution Place 1 drop into both eyes at  bedtime.    levothyroxine (SYNTHROID) 75 MCG tablet Take 1 tablet (75 mcg total) by mouth daily.   losartan (COZAAR) 100 MG tablet TAKE 1 TABLET DAILY.   metFORMIN (GLUCOPHAGE-XR) 500 MG 24 hr tablet TAKE 1 TABLET BY MOUTH EVERY DAY WITH BREAKFAST   metoprolol succinate (TOPROL-XL) 100 MG 24 hr tablet Take 1 tablet (100 mg total) by mouth daily. Take with or immediately following a meal.   minoxidil (LONITEN) 10 MG tablet TAKE 1 TAB IN THE MORNING & 1/2 TAB IN THE EVENING   omeprazole (PRILOSEC) 40 MG capsule TAKE 1 CAPSULE BY MOUTH EVERY DAY   rosuvastatin (CRESTOR) 10 MG tablet Take 1 tablet  (10 mg total) by mouth daily.   verapamil (CALAN-SR) 240 MG CR tablet Take 1 tablet (240 mg total) by mouth daily.   No facility-administered encounter medications on file as of 01/07/2022.    Allergies (verified) Patient has no known allergies.   History: Past Medical History:  Diagnosis Date   Atypical chest pain    [facility] 08-03-01 nl coronaries and lv fn, lvedp 30.  cardiac workup by Dr. Harrington Challenger 5/09 neg cardiolite for ischemia, rec rsik reduction   Chicken pox    Diverticulosis    colonoscopy 9/01........Marland KitchenDr. Deatra Ina   DM (diabetes mellitus) [facility])    GERD (gastroesophageal reflux disease)    Healthcare maintenance    pneumovax 09/2006, Td 04/2004, CPX June 12, 2010   Hypertension    Microcytic anemia    Morbid obesity ([place])    all time high 310 2009.  target wt = 208 for BMI <30.  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)   Renal insufficiency    baseline 1.6 October 24, 2009 > 1.3 June 12, 2010   Sleep apnea    Thyroid disease    Past Surgical History:  Procedure Laterality Date   KNEE SURGERY     TOTAL KNEE ARTHROPLASTY Right 08/10/2019   Procedure: RIGHT TOTAL KNEE ARTHROPLASTY;  Surgeon: Melrose Nakayama, MD;  Location: [facility];  Service: Orthopedics;  Laterality: Right;   Family History  Problem Relation Age of Onset   Diabetes Mother    Alcohol abuse Father    Throat cancer Brother        smoker   Alcohol abuse Brother    Colon cancer Neg Hx    Esophageal cancer Neg Hx    Liver cancer Neg Hx    Pancreatic cancer Neg Hx    Rectal cancer Neg Hx    Stomach cancer Neg Hx    Social History   Socioeconomic History   Marital status: Married    Spouse name: Not on file   Number of children: 2   Years of education: 18   Highest education level: Not on file  Occupational History   Occupation: Recruitment consultant for [employer]: [employer]   Tobacco Use   Smoking status: Former    Packs/day: 0.30    Years: 1.00    Total pack years: 0.30    Types: Cigarettes    Quit date: 04/08/1985    Years since quitting: 36.7   Smokeless tobacco: Never  Vaping Use   Vaping Use: Never used  Substance and Sexual Activity   Alcohol use: Yes    Comment: occasional   Drug use: No   Sexual activity: Yes  Other Topics Concern   Not on file  Social History Narrative   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    Social Determinants of Health   Financial Resource Strain: Low Risk  (01/07/2022)   Overall Financial Resource Strain (CARDIA)    Difficulty of Paying Living Expenses: Not hard at all  Food Insecurity: No Food Insecurity (01/07/2022)   Hunger Vital Sign    Worried About Running Out of Food in the Last Year: Never true    Ran Out of Food in the Last Year: Never true  Transportation Needs: No Transportation Needs (01/07/2022)   PRAPARE - Hydrologist (Medical): No    Lack of Transportation (Non-Medical): No  Physical Activity: Insufficiently Active (01/07/2022)   Exercise Vital Sign    Days of Exercise per Week: 3 days    Minutes of Exercise per Session: 30 min  Stress: No Stress Concern Present (01/07/2022)   [place]    Feeling of Stress : Not at all  Social Connections: [place] (01/07/2022)   Social Connection and Isolation Panel [NHANES]    Frequency of Communication with Friends and Family: More than three times a week    Frequency of Social Gatherings with Friends and Family: More than three times a week    Attends Religious Services: More than 4 times per year    Active Member of [employer] or Organizations: Yes    Attends Music therapist: More than 4 times per year    Marital Status:  Married    Tobacco Counseling Counseling given: Not Answered   Clinical Intake:  Pre-visit preparation completed: Yes  Pain : No/denies pain     Nutritional Risks: None Diabetes: No  How often do you need to have someone help you when you read instructions, pamphlets, or other written materials from your doctor or pharmacy?: 1 - Never  Diabetic?yes Nutrition Risk Assessment:  Has the patient had any N/V/D within the last 2 months?  No  Does the patient have any non-healing wounds?  No  Has the patient had any unintentional weight loss or weight gain?  No   Diabetes:  Is the patient diabetic?  Yes  If diabetic, was a CBG obtained today?  No  Did the patient bring in their glucometer from home?  No  How often do you monitor your CBG's? 2X Week .   Financial Strains and Diabetes Management:  Are you having any financial strains with the device, your supplies or your medication? No .  Does the patient want to be seen by Chronic Care Management for management of their diabetes?  No  Would the patient like to be referred to a Nutritionist or for Diabetic Management?  No   Diabetic Exams:  Diabetic Eye Exam: Completed 09/2021 Diabetic Foot Exam: Overdue, Pt has been advised about the importance in completing this exam. Pt is scheduled for diabetic foot exam on next office visit .   Interpreter Needed?: No  Information entered by :: Jadene Pierini, LPN   Activities of Daily Living    01/07/2022   11:21 AM  In your present state of health, do you have any difficulty performing the following activities:  Hearing? 0  Vision? 0  Difficulty concentrating or making decisions? 0  Walking or climbing stairs?  0  Dressing or bathing? 0  Doing errands, shopping? 0  Preparing Food and eating ? N  Using the Toilet? N  In the past six months, have you accidently leaked urine? N  Do you have problems with loss of bowel control? N  Managing your Medications? N  Managing your  Finances? N  Housekeeping or managing your Housekeeping? N    Patient Care Team: Biagio Borg, MD as PCP - General (Internal Medicine)  Indicate any recent Medical Services you may have received from other than [facility] providers in the past year (date may be approximate).     Assessment:   This is a routine wellness examination for Khalik.  Hearing/Vision screen Vision Screening - Comments:: Annual eye exams wear glasses   Dietary issues and exercise activities discussed: Current Exercise Habits: Home exercise routine, Type of exercise: walking, Time (Minutes): 30, Frequency (Times/Week): 3, Weekly Exercise (Minutes/Week): 90, Intensity: Mild, Exercise limited by: None identified   Goals Addressed             This Visit's Progress    Patient Stated   On track    Continue to lose weight by eating healthy and by exercising routinely.        Depression Screen    01/07/2022   11:19 AM 11/01/2021   10:24 AM 10/13/2020    3:49 PM 10/13/2020    2:37 PM 07/27/2019   10:43 AM 08/26/2018    9:46 AM 07/22/2018   10:40 AM  PHQ 2/9 Scores  PHQ - 2 Score 0 0 0 0 0 0 0  PHQ- 9 Score  1         Fall Risk    01/07/2022   11:16 AM 11/01/2021   10:24 AM 10/13/2020    3:49 PM 10/13/2020    2:40 PM 02/08/2020   10:04 AM  Fall Risk   Falls in the past year? 0 0 0 0 0  Number falls in past yr: 0 0 0 0 0  Injury with Fall? 0 0 0 0 0  Risk for fall due to : No Fall Risks   No Fall Risks No Fall Risks  Follow up Falls prevention discussed   Falls evaluation completed     FALL RISK PREVENTION PERTAINING TO THE HOME:  Any stairs in or around the home? Yes  If so, are there any without handrails? No  Home free of loose throw rugs in walkways, pet beds, electrical cords, etc? Yes  Adequate lighting in your home to reduce risk of falls? Yes   ASSISTIVE DEVICES UTILIZED TO PREVENT FALLS:  Life alert? No  Use of a cane, walker or w/c? No  Grab bars in the bathroom? Yes  Shower chair or bench in  shower? No  Elevated toilet seat or a handicapped toilet? No        01/07/2022   11:21 AM  6CIT Screen  What Year? 0 points  What month? 0 points  What time? 0 points  Count back from 20 0 points  Months in reverse 0 points  Repeat phrase 0 points  Total Score 0 points    Immunizations Immunization History  Administered Date(s) Administered   Fluad Quad(high Dose 65+) 12/16/2018, 02/08/2020, 03/07/2021   Influenza Whole 01/06/2009   Influenza, High Dose Seasonal PF 06/21/2015, 01/06/2017, 12/31/2017   Influenza-Unspecified 01/06/2014   PFIZER Comirnaty(Gray Top)Covid-19 Tri-Sucrose Vaccine 07/21/2020   PFIZER(Purple Top)SARS-COV-2 Vaccination 04/28/2019, 05/17/2019, 12/14/2019   PNEUMOCOCCAL CONJUGATE-20 03/07/2021   Pfizer  Covid-19 Vaccine Bivalent Booster 25yr & up 03/16/2021   Pneumococcal Conjugate-13 11/07/2014   Pneumococcal Polysaccharide-23 11/17/2013   Tdap 08/01/2014   Zoster Recombinat (Shingrix) 08/02/2019    TDAP status: Up to date  Flu Vaccine status: Up to date  Pneumococcal vaccine status: Up to date  Covid-19 vaccine status: Completed vaccines  Qualifies for Shingles Vaccine? Yes   Zostavax completed No   Shingrix Completed?: No.    Education has been provided regarding the importance of this vaccine. Patient has been advised to call insurance company to determine out of pocket expense if they have not yet received this vaccine. Advised may also receive vaccine at local pharmacy or Health Dept. Verbalized acceptance and understanding.  Screening Tests Health Maintenance  Topic Date Due   OPHTHALMOLOGY EXAM  03/16/2022   HEMOGLOBIN A1C  05/04/2022   Diabetic kidney evaluation - GFR measurement  11/02/2022   Diabetic kidney evaluation - Urine ACR  11/02/2022   FOOT EXAM  11/02/2022   TETANUS/TDAP  07/31/2024   COLONOSCOPY (Pts 45-467yrInsurance coverage will need to be confirmed)  08/23/2027   Pneumonia Vaccine 6573Years old  Completed    INFLUENZA VACCINE  Completed   Hepatitis C Screening  Completed   HPV VACCINES  Aged Out   COVID-19 Vaccine  Discontinued   Zoster Vaccines- Shingrix  Discontinued    Health Maintenance  There are no preventive care reminders to display for this patient.  Colorectal cancer screening: Type of screening: Colonoscopy. Completed 08/22/2017. Repeat every 10 years  Lung Cancer Screening: (Low Dose CT Chest recommended if Age 73-80ears, 30 pack-year currently smoking OR have quit w/in 15years.) does not qualify.   Lung Cancer Screening Referral: n/a  Additional Screening:  Hepatitis C Screening: does not qualify;   Vision Screening: Recommended annual ophthalmology exams for early detection of glaucoma and other disorders of the eye. Is the patient up to date with their annual eye exam?  Yes  Who is the provider or what is the name of the office in which the patient attends annual eye exams? Dr.tanner  If pt is not established with a provider, would they like to be referred to a provider to establish care? No .   Dental Screening: Recommended annual dental exams for proper oral hygiene  Community Resource Referral / Chronic Care Management: CRR required this visit?  No   CCM required this visit?  No      Plan:     I have personally reviewed and noted the following in the patient's chart:   Medical and social history Use of alcohol, tobacco or illicit drugs  Current medications and supplements including opioid prescriptions. Patient is not currently taking opioid prescriptions. Functional ability and status Nutritional status Physical activity Advanced directives List of other physicians Hospitalizations, surgeries, and ER visits in previous 12 months Vitals Screenings to include cognitive, depression, and falls Referrals and appointments  In addition, I have reviewed and discussed with patient certain preventive protocols, quality metrics, and best practice  recommendations. A written personalized care plan for preventive services as well as general preventive health recommendations were provided to patient.     LaDaphane ShepherdLPN   1077/11/2421 Nurse Notes: none

## 2022-01-16 ENCOUNTER — Other Ambulatory Visit (HOSPITAL_COMMUNITY): Payer: Self-pay

## 2022-04-13 DIAGNOSIS — G4733 Obstructive sleep apnea (adult) (pediatric): Secondary | ICD-10-CM | POA: Diagnosis not present

## 2022-04-16 ENCOUNTER — Other Ambulatory Visit: Payer: Self-pay | Admitting: Internal Medicine

## 2022-04-29 ENCOUNTER — Other Ambulatory Visit: Payer: Self-pay | Admitting: Internal Medicine

## 2022-04-29 NOTE — Telephone Encounter (Signed)
Please refill as per office routine med refill policy (all routine meds to be refilled for 3 mo or monthly (per pt preference) up to one year from last visit, then month to month grace period for 3 mo, then further med refills will have to be denied)

## 2022-04-30 ENCOUNTER — Other Ambulatory Visit: Payer: Self-pay

## 2022-05-01 ENCOUNTER — Telehealth (HOSPITAL_BASED_OUTPATIENT_CLINIC_OR_DEPARTMENT_OTHER): Payer: Self-pay | Admitting: Pulmonary Disease

## 2022-05-02 NOTE — Telephone Encounter (Signed)
Per Maryfrances Bunnell last reading was 04/02/22, called pt person answered didn't say anything just breath in the phone

## 2022-05-10 NOTE — Telephone Encounter (Signed)
Attempted to call pt but unable to reach. Left detailed message letting pt know that we are able to see current cpap data in [place] so he should be okay in regards to upcoming appt. Nothing further needed.

## 2022-05-14 ENCOUNTER — Telehealth: Payer: Self-pay | Admitting: Internal Medicine

## 2022-05-14 ENCOUNTER — Other Ambulatory Visit: Payer: Self-pay | Admitting: Internal Medicine

## 2022-05-14 DIAGNOSIS — G4733 Obstructive sleep apnea (adult) (pediatric): Secondary | ICD-10-CM | POA: Diagnosis not present

## 2022-05-14 MED ORDER — VICTOZA 18 MG/3ML ~~LOC~~ SOPN: 1.8000 mg | PEN_INJECTOR | Freq: Every day | SUBCUTANEOUS | 11 refills | Status: DC

## 2022-05-14 NOTE — Telephone Encounter (Signed)
Patient called wanting to know what he should do or if another medication could be called into his pharmacy on file because the medication Trulicity is still on back order and the pharmacy said it would be another month before the get it in. Best call back number for patient is [phone].

## 2022-05-14 NOTE — Telephone Encounter (Signed)
Notified pt [facility]

## 2022-05-14 NOTE — Telephone Encounter (Signed)
Ok for victoza instead- this is a Daily shot (not weekly like the trulicity)  - done erx

## 2022-05-21 ENCOUNTER — Telehealth: Payer: Self-pay | Admitting: Internal Medicine

## 2022-05-21 MED ORDER — INSULIN PEN NEEDLE 32G X 4 MM MISC
3 refills | Status: DC
Start: 2022-05-21 — End: 2022-05-30

## 2022-05-21 NOTE — Telephone Encounter (Signed)
Patient called and said he was prescribed liraglutide (VICTOZA) 18 MG/3ML SOPN and he has the pen for it, but he was not prescribed the needles to go with it. [id number] [place] [place] . He said he has been without medication for a month. Best callback number is [phone]

## 2022-05-21 NOTE — Telephone Encounter (Signed)
Called pt inform [facility]

## 2022-05-30 ENCOUNTER — Other Ambulatory Visit: Payer: Self-pay | Admitting: Internal Medicine

## 2022-05-30 MED ORDER — INSULIN PEN NEEDLE 32G X 4 MM MISC: 3 refills | Status: AC

## 2022-06-03 ENCOUNTER — Ambulatory Visit (HOSPITAL_BASED_OUTPATIENT_CLINIC_OR_DEPARTMENT_OTHER): Payer: Medicare PPO | Admitting: Pulmonary Disease

## 2022-06-12 DIAGNOSIS — G4733 Obstructive sleep apnea (adult) (pediatric): Secondary | ICD-10-CM | POA: Diagnosis not present

## 2022-07-07 ENCOUNTER — Other Ambulatory Visit: Payer: Self-pay | Admitting: Internal Medicine

## 2022-07-08 ENCOUNTER — Ambulatory Visit (INDEPENDENT_AMBULATORY_CARE_PROVIDER_SITE_OTHER): Payer: Medicare PPO | Admitting: Pulmonary Disease

## 2022-07-08 ENCOUNTER — Encounter (HOSPITAL_BASED_OUTPATIENT_CLINIC_OR_DEPARTMENT_OTHER): Payer: Self-pay | Admitting: Pulmonary Disease

## 2022-07-08 ENCOUNTER — Ambulatory Visit (HOSPITAL_BASED_OUTPATIENT_CLINIC_OR_DEPARTMENT_OTHER): Payer: Medicare PPO | Admitting: Pulmonary Disease

## 2022-07-08 VITALS — BP 170/96 | HR 98 | Temp 98.2°F | Ht 71.0 in | Wt 286.8 lb

## 2022-07-08 DIAGNOSIS — G4733 Obstructive sleep apnea (adult) (pediatric): Secondary | ICD-10-CM

## 2022-07-08 DIAGNOSIS — I10 Essential (primary) hypertension: Secondary | ICD-10-CM

## 2022-07-08 NOTE — Patient Instructions (Signed)
CPAP is working well on current settings.  Blood pressure is high today.  Please take your medications at home

## 2022-07-08 NOTE — Progress Notes (Signed)
   Subjective:    Patient ID: Matthew Keller, male    DOB: October 23, 1950, 72 y.o.   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  He is very compliant.  No problems with mask or pressure.  He denies problems driving he is considering retiring dementia. He denies sleep pressure in the daytime  Blood pressure is high today, he denies visual symptoms or headaches.  He admits that he did not take his medications this morning as he was rushing and had to go to a funeral. He reports occasional noncompliance with his diabetic diet.  Trulicity is on backorder and he is on Victoza  Significant tests/ events reviewed   HST 05/2021 Mild OSA AHI 8/hr, SPO2 low at 79%    4 /2009 NPSG showed obstructive sleep apnea with AHI 12/h, increases during REM to 50/h, lowest desaturation 75% c/w severe OSA.  Review of Systems neg for any significant sore throat, dysphagia, itching, sneezing, nasal congestion or excess/ purulent secretions, fever, chills, sweats, unintended wt loss, pleuritic or exertional cp, hempoptysis, orthopnea pnd or change in chronic leg swelling. Also denies presyncope, palpitations, heartburn, abdominal pain, nausea, vomiting, diarrhea or change in bowel or urinary habits, dysuria,hematuria, rash, arthralgias, visual complaints, headache, numbness weakness or ataxia.     Objective:   Physical Exam  Gen. Pleasant, obese, in no distress ENT - no lesions, no post nasal drip Neck: No JVD, no thyromegaly, no carotid bruits Lungs: no use of accessory muscles, no dullness to percussion, decreased without rales or rhonchi  Cardiovascular: Rhythm regular, heart sounds  normal, no murmurs or gallops, no peripheral edema Musculoskeletal: No deformities, no cyanosis or clubbing , no tremors       Assessment & Plan:

## 2022-07-08 NOTE — Assessment & Plan Note (Signed)
Blood pressure is high today.  He will go home and take his medications and follow-up with PCP

## 2022-07-08 NOTE — Assessment & Plan Note (Signed)
CPAP download was reviewed which shows excellent control of events 11 cm with minimal leak.  He is very compliant more than 6 hours every night and CPAP is only helped decrease his daytime somnolence and fatigue  Weight loss encouraged, compliance with goal of at least 4-6 hrs every night is the expectation. Advised against medications with sedative side effects Cautioned against driving when sleepy - understanding that sleepiness will vary on a day to day basis

## 2022-07-09 MED ORDER — TRULICITY 1.5 MG/0.5ML ~~LOC~~ SOAJ: 1.5000 mg | SUBCUTANEOUS | 3 refills | Status: DC

## 2022-07-09 NOTE — Telephone Encounter (Signed)
Ok to let pt know - I changed the victoza to trulicity as the victoza is on backorder

## 2022-07-10 NOTE — Telephone Encounter (Signed)
PT calls back in regards to this prescription. PT is now having the same issue with the liraglutide (VICTOZA) 18 MG/3ML SOPN. PT was informed of nationwide back order and was not told when this would be back in. PT wants to know of possible alternative or options while thy wait on it?  CB: [phone]

## 2022-07-10 NOTE — Telephone Encounter (Signed)
Victoza on back order, patient is requesting an alternative if possible

## 2022-07-10 NOTE — Telephone Encounter (Signed)
Ok for pt to ask pharmacy for which medication similar to [place] and trulicity is not on backorder, but still covered by his insurance, as I would not be able to know this,   thanks

## 2022-07-11 ENCOUNTER — Telehealth: Payer: Self-pay | Admitting: Internal Medicine

## 2022-07-11 MED ORDER — OZEMPIC (0.25 OR 0.5 MG/DOSE) 2 MG/3ML ~~LOC~~ SOPN: PEN_INJECTOR | SUBCUTANEOUS | 11 refills | Status: DC

## 2022-07-11 NOTE — Telephone Encounter (Signed)
Left message for patient to contact pharmacy for specifications of an alternate medication that would be covered by insurance

## 2022-07-11 NOTE — Telephone Encounter (Signed)
Matthew Keller - called for medication.  See previous note.  It appears that Dr Jenny Reichmann called in rx for ozempic. Please notify pt.  (Matthew Crull is going out of town 07/12/22)

## 2022-07-11 NOTE — Addendum Note (Signed)
Addended by: Biagio Borg on: 07/11/2022 07:46 PM   Modules accepted: Orders

## 2022-07-11 NOTE — Telephone Encounter (Signed)
Received call from call center.  Matthew Keller calling in for replacement for victoza.  He is unable to get victoza at the pharmacy and needs a prescription for ozempic.  He is going out of town tomorrow afternoon and needs the new medication sent in to the pharmacy.  Reviewed chart and spoke to Matthew Keller.  Apparently on victoza 18mg /87ml.  Pharmacy does not have victoza, but they do have ozempic.  Needs rx sent in for ozempic.  Explained that I would forward to Dr Jenny Reichmann - to send in new rx for ozempic (that way can determine dose, etc).

## 2022-07-12 NOTE — Telephone Encounter (Signed)
Left detailed message for patient.

## 2022-07-13 DIAGNOSIS — G4733 Obstructive sleep apnea (adult) (pediatric): Secondary | ICD-10-CM | POA: Diagnosis not present

## 2022-08-12 DIAGNOSIS — G4733 Obstructive sleep apnea (adult) (pediatric): Secondary | ICD-10-CM | POA: Diagnosis not present

## 2022-08-19 DIAGNOSIS — H2513 Age-related nuclear cataract, bilateral: Secondary | ICD-10-CM | POA: Diagnosis not present

## 2022-08-19 DIAGNOSIS — E119 Type 2 diabetes mellitus without complications: Secondary | ICD-10-CM | POA: Diagnosis not present

## 2022-08-19 DIAGNOSIS — H25013 Cortical age-related cataract, bilateral: Secondary | ICD-10-CM | POA: Diagnosis not present

## 2022-08-19 DIAGNOSIS — H40023 Open angle with borderline findings, high risk, bilateral: Secondary | ICD-10-CM | POA: Diagnosis not present

## 2022-08-19 DIAGNOSIS — H5203 Hypermetropia, bilateral: Secondary | ICD-10-CM | POA: Diagnosis not present

## 2022-08-19 DIAGNOSIS — H52203 Unspecified astigmatism, bilateral: Secondary | ICD-10-CM | POA: Diagnosis not present

## 2022-08-19 LAB — HM DIABETES EYE EXAM

## 2022-09-10 ENCOUNTER — Telehealth: Payer: Self-pay

## 2022-09-10 DIAGNOSIS — N189 Chronic kidney disease, unspecified: Secondary | ICD-10-CM | POA: Diagnosis not present

## 2022-09-10 DIAGNOSIS — E039 Hypothyroidism, unspecified: Secondary | ICD-10-CM | POA: Diagnosis not present

## 2022-09-10 DIAGNOSIS — E785 Hyperlipidemia, unspecified: Secondary | ICD-10-CM | POA: Diagnosis not present

## 2022-09-10 DIAGNOSIS — I129 Hypertensive chronic kidney disease with stage 1 through stage 4 chronic kidney disease, or unspecified chronic kidney disease: Secondary | ICD-10-CM | POA: Diagnosis not present

## 2022-09-10 DIAGNOSIS — K219 Gastro-esophageal reflux disease without esophagitis: Secondary | ICD-10-CM | POA: Diagnosis not present

## 2022-09-10 DIAGNOSIS — R609 Edema, unspecified: Secondary | ICD-10-CM | POA: Diagnosis not present

## 2022-09-10 DIAGNOSIS — R32 Unspecified urinary incontinence: Secondary | ICD-10-CM | POA: Diagnosis not present

## 2022-09-10 DIAGNOSIS — N529 Male erectile dysfunction, unspecified: Secondary | ICD-10-CM | POA: Diagnosis not present

## 2022-09-10 DIAGNOSIS — G4733 Obstructive sleep apnea (adult) (pediatric): Secondary | ICD-10-CM | POA: Diagnosis not present

## 2022-09-10 NOTE — Progress Notes (Signed)
   09/10/2022  Patient ID: Matthew Keller, male   DOB: 1951-03-05, 72 y.o.   MRN: [id number]  Patient outreach attempt to schedule telephone visit after patient appearing in quality report identifying failed measures in regard to adherence to diabetes, hypertension, and hyperlipidemia medications.  Unable to reach patient but did leave voicemail with my direct number.  Will attempt outreach again next week.   Lenna Gilford, PharmD, DPLA

## 2022-09-12 DIAGNOSIS — G4733 Obstructive sleep apnea (adult) (pediatric): Secondary | ICD-10-CM | POA: Diagnosis not present

## 2022-10-12 DIAGNOSIS — G4733 Obstructive sleep apnea (adult) (pediatric): Secondary | ICD-10-CM | POA: Diagnosis not present

## 2022-10-14 ENCOUNTER — Telehealth: Payer: Self-pay | Admitting: Internal Medicine

## 2022-10-14 DIAGNOSIS — E538 Deficiency of other specified B group vitamins: Secondary | ICD-10-CM

## 2022-10-14 DIAGNOSIS — E1159 Type 2 diabetes mellitus with other circulatory complications: Secondary | ICD-10-CM

## 2022-10-14 DIAGNOSIS — E1165 Type 2 diabetes mellitus with hyperglycemia: Secondary | ICD-10-CM

## 2022-10-14 DIAGNOSIS — Z125 Encounter for screening for malignant neoplasm of prostate: Secondary | ICD-10-CM

## 2022-10-14 DIAGNOSIS — E559 Vitamin D deficiency, unspecified: Secondary | ICD-10-CM

## 2022-10-14 NOTE — Telephone Encounter (Signed)
Pt has lab appt 7/24 and CPE 7/29.  Please enter lab orders for this visit.

## 2022-10-14 NOTE — Telephone Encounter (Signed)
Ok this is done 

## 2022-10-30 ENCOUNTER — Other Ambulatory Visit: Payer: Medicare PPO

## 2022-11-04 ENCOUNTER — Ambulatory Visit (INDEPENDENT_AMBULATORY_CARE_PROVIDER_SITE_OTHER): Payer: Medicare PPO | Admitting: Internal Medicine

## 2022-11-04 ENCOUNTER — Encounter: Payer: Self-pay | Admitting: Internal Medicine

## 2022-11-04 VITALS — BP 122/78 | HR 65 | Temp 97.9°F | Ht 71.0 in | Wt 283.0 lb

## 2022-11-04 DIAGNOSIS — E559 Vitamin D deficiency, unspecified: Secondary | ICD-10-CM | POA: Diagnosis not present

## 2022-11-04 DIAGNOSIS — E7800 Pure hypercholesterolemia, unspecified: Secondary | ICD-10-CM

## 2022-11-04 DIAGNOSIS — E1159 Type 2 diabetes mellitus with other circulatory complications: Secondary | ICD-10-CM

## 2022-11-04 DIAGNOSIS — E538 Deficiency of other specified B group vitamins: Secondary | ICD-10-CM | POA: Diagnosis not present

## 2022-11-04 DIAGNOSIS — Z125 Encounter for screening for malignant neoplasm of prostate: Secondary | ICD-10-CM | POA: Diagnosis not present

## 2022-11-04 DIAGNOSIS — N1831 Chronic kidney disease, stage 3a: Secondary | ICD-10-CM

## 2022-11-04 DIAGNOSIS — Z7984 Long term (current) use of oral hypoglycemic drugs: Secondary | ICD-10-CM | POA: Diagnosis not present

## 2022-11-04 DIAGNOSIS — I10 Essential (primary) hypertension: Secondary | ICD-10-CM | POA: Diagnosis not present

## 2022-11-04 DIAGNOSIS — Z0001 Encounter for general adult medical examination with abnormal findings: Secondary | ICD-10-CM

## 2022-11-04 DIAGNOSIS — E1165 Type 2 diabetes mellitus with hyperglycemia: Secondary | ICD-10-CM

## 2022-11-04 LAB — HEPATIC FUNCTION PANEL
ALT: 16 U/L (ref 0–53)
AST: 21 U/L (ref 0–37)
Albumin: 4.1 g/dL (ref 3.5–5.2)
Alkaline Phosphatase: 75 U/L (ref 39–117)
Bilirubin, Direct: 0.2 mg/dL (ref 0.0–0.3)
Total Bilirubin: 1.3 mg/dL — ABNORMAL HIGH (ref 0.2–1.2)
Total Protein: 7.1 g/dL (ref 6.0–8.3)

## 2022-11-04 LAB — LIPID PANEL
Cholesterol: 122 mg/dL (ref 0–200)
HDL: 45.5 mg/dL (ref >39.00)
LDL Cholesterol: 59 mg/dL (ref 0–99)
NonHDL: 76.74
Total CHOL/HDL Ratio: 3
Triglycerides: 88 mg/dL (ref 0.0–149.0)
VLDL: 17.6 mg/dL (ref 0.0–40.0)

## 2022-11-04 LAB — CBC WITH DIFFERENTIAL/PLATELET
Basophils Absolute: 0.1 10*3/uL (ref 0.0–0.1)
Basophils Relative: 0.7 % (ref 0.0–3.0)
Eosinophils Absolute: 0.3 10*3/uL (ref 0.0–0.7)
Eosinophils Relative: 3.4 % (ref 0.0–5.0)
HCT: 43.3 % (ref 39.0–52.0)
Hemoglobin: 14.2 g/dL (ref 13.0–17.0)
Lymphocytes Relative: 27.4 % (ref 12.0–46.0)
Lymphs Abs: 2.1 10*3/uL (ref 0.7–4.0)
MCHC: 32.7 g/dL (ref 30.0–36.0)
MCV: 76.4 fl — ABNORMAL LOW (ref 78.0–100.0)
Monocytes Absolute: 0.6 10*3/uL (ref 0.1–1.0)
Monocytes Relative: 7.9 % (ref 3.0–12.0)
Neutro Abs: 4.6 10*3/uL (ref 1.4–7.7)
Neutrophils Relative %: 60.6 % (ref 43.0–77.0)
Platelets: 231 10*3/uL (ref 150.0–400.0)
RBC: 5.66 Mil/uL (ref 4.22–5.81)
RDW: 16.5 % — ABNORMAL HIGH (ref 11.5–15.5)
WBC: 7.6 10*3/uL (ref 4.0–10.5)

## 2022-11-04 LAB — MICROALBUMIN / CREATININE URINE RATIO
Creatinine,U: 67 mg/dL
Microalb Creat Ratio: 1.4 mg/g (ref 0.0–30.0)
Microalb, Ur: 0.9 mg/dL (ref 0.0–1.9)

## 2022-11-04 LAB — URINALYSIS, ROUTINE W REFLEX MICROSCOPIC
Bilirubin Urine: NEGATIVE
Hgb urine dipstick: NEGATIVE
Ketones, ur: NEGATIVE
Leukocytes,Ua: NEGATIVE
Nitrite: NEGATIVE
RBC / HPF: NONE SEEN (ref 0–?)
Specific Gravity, Urine: 1.015 (ref 1.000–1.030)
Total Protein, Urine: NEGATIVE
Urine Glucose: >=1000 — AB
Urobilinogen, UA: 0.2 (ref 0.0–1.0)
WBC, UA: NONE SEEN (ref 0–?)
pH: 6 (ref 5.0–8.0)

## 2022-11-04 LAB — BASIC METABOLIC PANEL
BUN: 15 mg/dL (ref 6–23)
CO2: 28 mEq/L (ref 19–32)
Calcium: 10.1 mg/dL (ref 8.4–10.5)
Chloride: 104 mEq/L (ref 96–112)
Creatinine, Ser: 1.49 mg/dL (ref 0.40–1.50)
GFR: 46.22 mL/min — ABNORMAL LOW (ref >60.00)
Glucose, Bld: 106 mg/dL — ABNORMAL HIGH (ref 70–99)
Potassium: 4 mEq/L (ref 3.5–5.1)
Sodium: 140 mEq/L (ref 135–145)

## 2022-11-04 LAB — PSA: PSA: 0.26 ng/mL (ref 0.10–4.00)

## 2022-11-04 LAB — VITAMIN B12: Vitamin B-12: 357 pg/mL (ref 211–911)

## 2022-11-04 LAB — VITAMIN D 25 HYDROXY (VIT D DEFICIENCY, FRACTURES): VITD: 35.78 ng/mL (ref 30.00–100.00)

## 2022-11-04 LAB — TSH: TSH: 3.64 u[IU]/mL (ref 0.35–5.50)

## 2022-11-04 LAB — HEMOGLOBIN A1C: Hgb A1c MFr Bld: 6.9 % — ABNORMAL HIGH (ref 4.6–6.5)

## 2022-11-04 MED ORDER — OZEMPIC (0.25 OR 0.5 MG/DOSE) 2 MG/3ML ~~LOC~~ SOPN: PEN_INJECTOR | SUBCUTANEOUS | 3 refills | Status: DC

## 2022-11-04 NOTE — Progress Notes (Unsigned)
Patient ID: Matthew Keller, male   DOB: [birth year], 72 y.o.   MRN: [id number]         Chief Complaint:: wellness exam and dm, hld, obeisty, htn, ckd3a, low vit d        HPI:  Matthew Keller is a 72 y.o. male here for wellness exam; up to date                Also Pt denies chest pain, increased sob or doe, wheezing, orthopnea, PND, increased LE swelling, palpitations, dizziness or syncope.   Pt denies polydipsia, polyuria, or new focal neuro s/s.    Pt denies fever, wt loss, night sweats, loss of appetite, or other constitutional symptoms     Wt Readings from Last 3 Encounters:  11/04/22 283 lb (128.4 kg)  07/08/22 286 lb 12.8 oz (130.1 kg)  01/07/22 280 lb (127 kg)   BP Readings from Last 3 Encounters:  11/04/22 122/78  07/08/22 (!) 170/96  11/01/21 (!) 152/80   Immunization History  Administered Date(s) Administered   COVID-19, mRNA, vaccine(Comirnaty)12 years and older 01/23/2022   Fluad Quad(high Dose 65+) 12/16/2018, 02/08/2020, 03/07/2021, 12/20/2021   Influenza Whole 01/06/2009   Influenza, High Dose Seasonal PF 06/21/2015, 01/06/2017, 12/31/2017   Influenza-Unspecified 01/06/2014   PFIZER Comirnaty(Gray Top)Covid-19 Tri-Sucrose Vaccine 07/21/2020   PFIZER(Purple Top)SARS-COV-2 Vaccination 04/28/2019, 05/17/2019, 12/14/2019   PNEUMOCOCCAL CONJUGATE-20 03/07/2021   Pfizer Covid-19 Vaccine Bivalent Booster 52yrs & up 03/16/2021   Pneumococcal Conjugate-13 11/07/2014   Pneumococcal Polysaccharide-23 11/17/2013   Tdap 08/01/2014   Zoster Recombinant(Shingrix) 08/02/2019   There are no preventive care reminders to display for this patient.     Past Medical History:  Diagnosis Date   Atypical chest pain    [facility] 08-03-01 nl coronaries and lv fn, lvedp 30.  cardiac workup by Dr. Tenny Craw 5/09 neg cardiolite for ischemia, rec rsik reduction   Chicken pox    Diverticulosis    colonoscopy 9/01........Marland KitchenDr. Arlyce Dice   DM (diabetes mellitus) [facility])    GERD (gastroesophageal reflux disease)     Healthcare maintenance    pneumovax 09/2006, Td 04/2004, CPX June 12, 2010   Hypertension    Microcytic anemia    Morbid obesity (HCC)    all time high 310 2009.  target wt = 208 for BMI <30.  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 His smoking use included cigarettes. He started smoking about 38 years ago. He has a 0.3 pack-year smoking history. He has never used smokeless tobacco. He reports current alcohol use. He reports that he does not use drugs. family history includes Alcohol abuse in his brother and father; Diabetes in his mother; Throat cancer in his brother. No Known Allergies Current Outpatient Medications on File Prior to Visit  Medication Sig Dispense Refill   acetaminophen (TYLENOL) 325 MG tablet Take 650 mg by mouth every 6 (six) hours as needed  for moderate pain.      aspirin 81 MG chewable tablet Chew 1 tablet (81 mg total) by mouth 2 (two) times daily. 30 tablet 0   Blood Glucose Monitoring Suppl (ONETOUCH VERIO IQ SYSTEM) w/Device KIT Use to check sugars 1-2 times per day 1 kit 0   Cholecalciferol 50 MCG (2000 UT) TABS 1 tab by mouth once daily 30 tablet 99   [employer] VITAMIN B12 1000 MCG tablet TAKE 1 TABLET BY MOUTH EVERY DAY 30 tablet 0   empagliflozin (JARDIANCE) 25 MG TABS tablet Take 1 tablet (25 mg total) by mouth daily. 90 tablet 3   furosemide (LASIX) 20 MG tablet Take 1 tablet (20 mg total) by mouth daily. 90 tablet 3   glipiZIDE (GLUCOTROL XL)  2.5 MG 24 hr tablet Take 1 tablet (2.5 mg total) by mouth daily with breakfast. Patient needs a routine well visit. 90 tablet 3   glucose blood (ONETOUCH VERIO) test strip Use to check sugars 1-2 times per day 100 each 12   Insulin Pen Needle 32G X 4 MM MISC Use to administer Victoza everyday once daily E11.9 90 each 3   Lancets (ONETOUCH ULTRASOFT) lancets Use as instructed 100 each 12   latanoprost (XALATAN) 0.005 % ophthalmic solution Place 1 drop into both eyes at bedtime.      levothyroxine (SYNTHROID) 75 MCG tablet Take 1 tablet (75 mcg total) by mouth daily. 90 tablet 3   losartan (COZAAR) 100 MG tablet TAKE 1 TABLET DAILY. 90 tablet 3   metFORMIN (GLUCOPHAGE-XR) 500 MG 24 hr tablet TAKE 1 TABLET BY MOUTH EVERY DAY WITH BREAKFAST 90 tablet 3   metoprolol succinate (TOPROL-XL) 100 MG 24 hr tablet Take 1 tablet (100 mg total) by mouth daily. Take with or immediately following a meal. 90 tablet 3   minoxidil (LONITEN) 10 MG tablet TAKE 1 TAB IN THE MORNING & 1/2 TAB IN THE EVENING 135 tablet 3   omeprazole (PRILOSEC) 40 MG capsule TAKE 1 CAPSULE BY MOUTH EVERY DAY 90 capsule 3   rosuvastatin (CRESTOR) 10 MG tablet Take 1 tablet (10 mg total) by mouth daily. 90 tablet 3   verapamil (CALAN-SR) 240 MG CR tablet Take 1 tablet (240 mg total) by mouth daily. 90 tablet 3   No current facility-administered medications on file prior to visit.        ROS:  All others reviewed and negative.  Objective        PE:  BP 122/78 (BP Location: Right Arm, Patient Position: Sitting, Cuff Size: Normal)   Pulse 65   Temp 97.9 F (36.6 C) (Oral)   Ht 5\' 11"  (1.803 m)   Wt 283 lb (128.4 kg)   SpO2 99%   BMI 39.47 kg/m                 Constitutional: Pt appears in NAD               HENT: Head: NCAT.                Right Ear: External ear normal.                 Left Ear: External ear normal.                Eyes: . Pupils are equal, round, and reactive to light. Conjunctivae and EOM are normal                Nose: without d/c or deformity  Neck: Neck supple. Gross normal ROM               Cardiovascular: Normal rate and regular rhythm.                 Pulmonary/Chest: Effort normal and breath sounds without rales or wheezing.                Abd:  Soft, NT, ND, + BS, no organomegaly               Neurological: Pt is alert. At baseline orientation, motor grossly intact               Skin: Skin is warm. No rashes, no other new lesions, LE edema - none               Psychiatric: Pt behavior is normal without agitation   Micro: none  Cardiac tracings I have personally interpreted today:  none  Pertinent Radiological findings (summarize): none   Lab Results  Component Value Date   WBC 7.6 11/04/2022   HGB 14.2 11/04/2022   HCT 43.3 11/04/2022   PLT 231.0 11/04/2022   GLUCOSE 106 (H) 11/04/2022   CHOL 122 11/04/2022   TRIG 88.0 11/04/2022   HDL 45.50 11/04/2022   LDLCALC 59 11/04/2022   ALT 16 11/04/2022   AST 21 11/04/2022   NA 140 11/04/2022   K 4.0 11/04/2022   CL 104 11/04/2022   CREATININE 1.49 11/04/2022   BUN 15 11/04/2022   CO2 28 11/04/2022   TSH 3.64 11/04/2022   PSA 0.26 11/04/2022   INR 1.0 08/03/2019   HGBA1C 6.9 (H) 11/04/2022   MICROALBUR 0.9 11/04/2022   Assessment/Plan:  Matthew Keller is a 72 y.o. Black or African American [2] male with  has a past medical history of Atypical chest pain, Chicken pox, Diverticulosis, DM (diabetes mellitus) (HCC), GERD (gastroesophageal reflux disease), Healthcare maintenance, Hypertension, Microcytic anemia, Morbid obesity (HCC), OSA (obstructive sleep apnea), Renal insufficiency, Sleep apnea, and Thyroid disease.  Encounter for well adult exam with abnormal findings Age and sex appropriate education and counseling updated with regular exercise and diet Referrals for preventative services - none needed Immunizations addressed - none needed Smoking counseling  - none needed Evidence for depression or other mood  disorder - none significant Most recent labs reviewed. I have personally reviewed and have noted: 1) the patient's medical and social history 2) The patient's current medications and supplements 3) The patient's height, weight, and BMI have been recorded in the chart   Poorly controlled type 2 diabetes mellitus with circulatory disorder (HCC) Lab Results  Component Value Date   HGBA1C 6.9 (H) 11/04/2022   unocntrolled, pt to continue current medical treatment jardiance 25 every day, glipizide 2.5 every day, metformin ER 500 mg 1 every day, but increased ozempic to 0.5 mg weekly   HYPERCHOLESTEROLEMIA Lab Results  Component Value Date   LDLCALC 59 11/04/2022   Stable, pt to continue current statin crestor 10 qd   Morbid obesity (HCC) Also to improve with increased ozempic  Essential hypertension BP Readings from Last 3 Encounters:  11/04/22 122/78  07/08/22 (!) 170/96  11/01/21 (!) 152/80   Stable, pt to continue medical treatment loniten 10 am, 5 pm, losartan 100 every day, toprol xl 100 every day, card cd 240 qd    CKD (chronic kidney disease) stage 3, GFR 30-59 ml/min (HCC) Lab Results  Component Value Date   CREATININE 1.49 11/04/2022   Stable  overall, cont to avoid nephrotoxins   Vitamin D deficiency Last vitamin D Lab Results  Component Value Date   VD25OH 35.78 11/04/2022   Low to start oral replacement   B12 deficiency Lab Results  Component Value Date   VITAMINB12 357 11/04/2022   Low, to start oral replacement - b12 1000 mcg qd  Followup: Return in about 6 months (around 05/07/2023).  Oliver Barre, MD 11/06/2022 7:35 PM [facility]

## 2022-11-04 NOTE — Progress Notes (Signed)
The test results show that your current treatment is OK, as the tests are stable.  Please continue the same plan.  There is no other need for change of treatment or further evaluation based on these results, at this time.  thanks 

## 2022-11-04 NOTE — Patient Instructions (Signed)
Ok to increase the ozempic to the 0.5 mg weekly  Please continue all other medications as before, and refills have been done if requested.  Please have the pharmacy call with any other refills you may need.  Please continue your efforts at being more active, low cholesterol diet, and weight control.  You are otherwise up to date with prevention measures today.  Please keep your appointments with your specialists as you may have planned  Please go to the LAB at the blood drawing area for the tests to be done  You will be contacted by phone if any changes need to be made immediately.  Otherwise, you will receive a letter about your results with an explanation, but please check with MyChart first.  Please remember to sign up for MyChart if you have not done so, as this will be important to you in the future with finding out test results, communicating by private email, and scheduling acute appointments online when needed.  Please make an Appointment to return in 6 months, or sooner if needed

## 2022-11-06 ENCOUNTER — Encounter: Payer: Self-pay | Admitting: Internal Medicine

## 2022-11-06 DIAGNOSIS — E538 Deficiency of other specified B group vitamins: Secondary | ICD-10-CM | POA: Insufficient documentation

## 2022-11-06 NOTE — Assessment & Plan Note (Signed)
Last vitamin D Lab Results  Component Value Date   VD25OH 35.78 11/04/2022   Low to start oral replacement

## 2022-11-06 NOTE — Assessment & Plan Note (Signed)
Lab Results  Component Value Date   LDLCALC 59 11/04/2022   Stable, pt to continue current statin crestor 10 qd

## 2022-11-06 NOTE — Assessment & Plan Note (Signed)
Also to improve with increased ozempic

## 2022-11-06 NOTE — Assessment & Plan Note (Signed)
Lab Results  Component Value Date   CREATININE 1.49 11/04/2022   Stable overall, cont to avoid nephrotoxins

## 2022-11-06 NOTE — Assessment & Plan Note (Signed)
BP Readings from Last 3 Encounters:  11/04/22 122/78  07/08/22 (!) 170/96  11/01/21 (!) 152/80   Stable, pt to continue medical treatment loniten 10 am, 5 pm, losartan 100 every day, toprol xl 100 every day, card cd 240 qd

## 2022-11-06 NOTE — Assessment & Plan Note (Signed)

## 2022-11-06 NOTE — Assessment & Plan Note (Signed)
Lab Results  Component Value Date   HGBA1C 6.9 (H) 11/04/2022   unocntrolled, pt to continue current medical treatment jardiance 25 every day, glipizide 2.5 every day, metformin ER 500 mg 1 every day, but increased ozempic to 0.5 mg weekly

## 2022-11-06 NOTE — Assessment & Plan Note (Signed)
Lab Results  Component Value Date   VITAMINB12 357 11/04/2022   Low, to start oral replacement - b12 1000 mcg qd

## 2022-11-12 DIAGNOSIS — G4733 Obstructive sleep apnea (adult) (pediatric): Secondary | ICD-10-CM | POA: Diagnosis not present

## 2022-11-29 ENCOUNTER — Other Ambulatory Visit: Payer: Self-pay | Admitting: Internal Medicine

## 2022-12-02 ENCOUNTER — Other Ambulatory Visit: Payer: Self-pay | Admitting: Internal Medicine

## 2022-12-02 ENCOUNTER — Other Ambulatory Visit: Payer: Self-pay

## 2022-12-13 DIAGNOSIS — G4733 Obstructive sleep apnea (adult) (pediatric): Secondary | ICD-10-CM | POA: Diagnosis not present

## 2022-12-28 ENCOUNTER — Other Ambulatory Visit: Payer: Self-pay | Admitting: Internal Medicine

## 2022-12-30 ENCOUNTER — Other Ambulatory Visit: Payer: Self-pay

## 2023-01-10 ENCOUNTER — Ambulatory Visit (INDEPENDENT_AMBULATORY_CARE_PROVIDER_SITE_OTHER): Payer: Medicare PPO

## 2023-01-10 VITALS — Ht 71.0 in | Wt 276.0 lb

## 2023-01-10 DIAGNOSIS — Z0000 Encounter for general adult medical examination without abnormal findings: Secondary | ICD-10-CM | POA: Diagnosis not present

## 2023-01-10 NOTE — Patient Instructions (Addendum)
Matthew Keller , Thank you for taking time to come for your Medicare Wellness Visit. I appreciate your ongoing commitment to your health goals. Please review the following plan we discussed and let me know if I can assist you in the future.   Referrals/Orders/Follow-Ups/Clinician Recommendations: You are up to date on your health maintenance.  I have sent a message to your PCP for a refill request for a Glucose monitor, so you can start checking your sugar daily.  It was nice speaking to you today.  Keep up the good work.   This is a list of the screening recommended for you and due dates:  Health Maintenance  Topic Date Due   Hemoglobin A1C  05/07/2023   Eye exam for diabetics  08/19/2023   Yearly kidney function blood test for diabetes  11/04/2023   Yearly kidney health urinalysis for diabetes  11/04/2023   Complete foot exam   11/04/2023   Medicare Annual Wellness Visit  01/10/2024   DTaP/Tdap/Td vaccine (2 - Td or Tdap) 07/31/2024   Colon Cancer Screening  08/23/2027   Pneumonia Vaccine  Completed   Flu Shot  Completed   Hepatitis C Screening  Completed   HPV Vaccine  Aged Out   COVID-19 Vaccine  Discontinued   Zoster (Shingles) Vaccine  Discontinued    Advanced directives: (Copy Requested) Please bring a copy of your health care power of attorney and living will to the office to be added to your chart at your convenience.  Next Medicare Annual Wellness Visit scheduled for next year: Yes

## 2023-01-10 NOTE — Progress Notes (Signed)
Subjective:   Matthew Keller is a 72 y.o. male who presents for Medicare Annual/Subsequent preventive examination.  Visit Complete: Virtual  I connected with  Octavia Bruckner on 01/10/23 by a audio enabled telemedicine application and verified that I am speaking with the correct person using two identifiers.  Patient Location: Home  Provider Location: Office/Clinic  I discussed the limitations of evaluation and management by telemedicine. The patient expressed understanding and agreed to proceed.  Because this visit was a virtual/telehealth visit, some criteria may be missing or patient reported. Any vitals not documented were not able to be obtained and vitals that have been documented are patient reported.    Cardiac Risk Factors include: male gender;advanced age (>73men, >1 women);dyslipidemia;hypertension     Objective:    Today's Vitals   01/10/23 1122  Weight: 276 lb (125.2 kg)  Height: 5\' 11"  (1.803 m)   Body mass index is 38.49 kg/m.     01/10/2023   11:32 AM 01/07/2022   11:21 AM 10/13/2020    2:39 PM 08/10/2019    6:00 PM 08/10/2019   10:19 AM 08/03/2019    9:33 AM 07/27/2019   10:42 AM  Advanced Directives  Does Patient Have a Medical Advance Directive? No Yes Yes No No No No  Type of Estate agent of [place];Living will Healthcare Power of [place];Living will Healthcare Power of [place];Living will      Does patient want to make changes to medical advance directive?   No - Patient declined      Copy of Healthcare Power of Attorney in Chart? No - copy requested No - copy requested No - copy requested      Would patient like information on creating a medical advance directive?    No - Patient declined No - Patient declined No - Patient declined Yes (ED - Information included in AVS)    Current Medications (verified) Outpatient Encounter Medications as of 01/10/2023  Medication Sig   omeprazole (PRILOSEC) 40 MG capsule TAKE 1 CAPSULE BY MOUTH  EVERY DAY   rosuvastatin (CRESTOR) 10 MG tablet TAKE 1 TABLET BY MOUTH EVERY DAY   Semaglutide,0.25 or 0.5MG /DOS, (OZEMPIC, 0.25 OR 0.5 MG/DOSE,) 2 MG/3ML SOPN Take 0.50 mg subq once weekly   verapamil (CALAN-SR) 240 MG CR tablet Take 1 tablet (240 mg total) by mouth daily.   acetaminophen (TYLENOL) 325 MG tablet Take 650 mg by mouth every 6 (six) hours as needed for moderate pain.    aspirin 81 MG chewable tablet Chew 1 tablet (81 mg total) by mouth 2 (two) times daily.   Blood Glucose Monitoring Suppl (ONETOUCH VERIO IQ SYSTEM) w/Device KIT Use to check sugars 1-2 times per day (Patient not taking: Reported on 01/10/2023)   Cholecalciferol 50 MCG (2000 UT) TABS 1 tab by mouth once daily   [facility] VITAMIN B12 1000 MCG tablet TAKE 1 TABLET BY MOUTH EVERY DAY   furosemide (LASIX) 20 MG tablet TAKE 1 TABLET BY MOUTH EVERY DAY   glipiZIDE (GLUCOTROL XL) 2.5 MG 24 hr tablet TAKE 1 TABLET (2.5 MG TOTAL) BY MOUTH DAILY WITH BREAKFAST. PATIENT NEEDS A ROUTINE WELL VISIT.   glucose blood (ONETOUCH VERIO) test strip Use to check sugars 1-2 times per day (Patient not taking: Reported on 01/10/2023)   Insulin Pen Needle 32G X 4 MM MISC Use to administer Victoza everyday once daily E11.9   JARDIANCE 25 MG TABS tablet TAKE 1 TABLET (25 MG TOTAL) BY MOUTH DAILY.   Lancets (  ONETOUCH ULTRASOFT) lancets Use as instructed   latanoprost (XALATAN) 0.005 % ophthalmic solution Place 1 drop into both eyes at bedtime.    levothyroxine (SYNTHROID) 75 MCG tablet TAKE 1 TABLET BY MOUTH EVERY DAY   losartan (COZAAR) 100 MG tablet TAKE 1 TABLET BY MOUTH EVERY DAY   metFORMIN (GLUCOPHAGE-XR) 500 MG 24 hr tablet TAKE 1 TABLET BY MOUTH EVERY DAY WITH BREAKFAST   metoprolol succinate (TOPROL-XL) 100 MG 24 hr tablet TAKE 1 TABLET BY MOUTH DAILY. TAKE WITH OR IMMEDIATELY FOLLOWING A MEAL.   minoxidil (LONITEN) 10 MG tablet TAKE 1 TAB IN THE MORNING & 1/2 TAB IN THE EVENING   No facility-administered encounter medications on file as of  01/10/2023.    Allergies (verified) Patient has no known allergies.   History: Past Medical History:  Diagnosis Date   Atypical chest pain    [facility] 08-03-01 nl coronaries and lv fn, lvedp 30.  cardiac workup by Dr. Tenny Craw 5/09 neg cardiolite for ischemia, rec rsik reduction   Chicken pox    Diverticulosis    colonoscopy 9/01........Marland KitchenDr. Arlyce Dice   DM (diabetes mellitus) [facility])    GERD (gastroesophageal reflux disease)    Healthcare maintenance    pneumovax 09/2006, Td 04/2004, CPX June 12, 2010   Hypertension    Microcytic anemia    Morbid obesity (HCC)    all time high 310 2009.  target wt = 208 for BMI <30.  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   Procedure: RIGHT TOTAL KNEE ARTHROPLASTY;  Surgeon: Marcene Corning, MD;  Location: [facility];  Service: Orthopedics;  Laterality: Right;   Family History  Problem Relation Age of Onset   Diabetes Mother    Alcohol abuse Father    Throat cancer Brother        smoker   Alcohol abuse Brother    Colon cancer Neg Hx    Esophageal cancer Neg Hx    Liver cancer Neg Hx    Pancreatic cancer Neg Hx    Rectal cancer Neg Hx    Stomach cancer Neg Hx    Social History   Socioeconomic History   Marital status: Married    Spouse name: Eulah Citizen   Number of children: 2   Years of education: 18   Highest education level: Not on file  Occupational History   Occupation: Midwife for [employer]: [employer]  Tobacco Use   Smoking status: Former    Current packs/day: 0.00    Average packs/day: 0.3 packs/day for 1 year (0.3 ttl pk-yrs)    Types: Cigarettes    Start date: 04/08/1984    Quit  date: 04/08/1985    Years since quitting: 37.7   Smokeless tobacco: Never  Vaping Use   Vaping status: Never Used  Substance and Sexual Activity   Alcohol use: Yes    Comment: occasional   Drug use: No   Sexual activity: Yes  Other Topics Concern   Not on file  Social History Narrative   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    Social Determinants of Health   Financial Resource Strain: Medium Risk (01/10/2023)   Overall Financial Resource Strain (CARDIA)    Difficulty of Paying Living Expenses: Somewhat hard  Food Insecurity: No Food Insecurity (01/10/2023)   Hunger Vital Sign    Worried About Running Out of Food in the Last Year: Never true    Ran Out of Food in the Last Year: Never true  Transportation Needs: No Transportation Needs (01/10/2023)   PRAPARE - Administrator, Civil Service (Medical): No    Lack of Transportation (Non-Medical): No  Physical Activity: Insufficiently Active (01/10/2023)   Exercise Vital Sign    Days of Exercise per Week: 3 days    Minutes of Exercise per Session: 40 min  Stress: No Stress Concern Present (01/10/2023)   [employer] of Occupational Health - Occupational Stress Questionnaire    Feeling of Stress : Not at all  Social Connections: Socially Integrated (01/10/2023)   Social Connection and Isolation Panel [NHANES]    Frequency of Communication with Friends and Family: More than three times a week    Frequency of Social Gatherings with Friends and Family: More than three times a week    Attends Religious Services: More than 4 times per year    Active Member of [employer] or Organizations: Yes    Attends Engineer, structural: More than 4 times per year    Marital Status: Married    Tobacco Counseling Counseling given: Not Answered   Clinical Intake:  Pre-visit preparation completed:  Yes  Pain : No/denies pain     BMI - recorded: 38.49 Nutritional Status: BMI > 30  Obese Nutritional Risks: None Diabetes: Yes CBG done?: No Did pt. bring in CBG monitor from home?: No  How often do you need to have someone help you when you read instructions, pamphlets, or other written materials from your doctor or pharmacy?: 1 - Never  Interpreter Needed?: No  Information entered by :: Minor Iden, RMA   Activities of Daily Living    01/10/2023   11:26 AM  In your present state of health, do you have any difficulty performing the following activities:  Hearing? 0  Vision? 0  Difficulty concentrating or making decisions? 0  Walking or climbing stairs? 0  Dressing or bathing? 0  Doing errands, shopping? 0  Preparing Food and eating ? N  Using the Toilet? N  In the past six months, have you accidently leaked urine? Y  Do you have problems with loss of bowel control? N  Managing your Medications? N  Managing your Finances? N  Housekeeping or managing your Housekeeping? N    Patient Care Team: Corwin Levins, MD as PCP - General (Internal Medicine)  Indicate any recent Medical Services you may have received from other than [facility] providers in the past year (date may be approximate).     Assessment:   This is a routine wellness examination for Seon.  Hearing/Vision screen Hearing Screening - Comments:: Denies hearing difficulties     Goals Addressed   None   Depression Screen    01/10/2023   11:35 AM 11/04/2022   11:00 AM 01/07/2022   11:19 AM 11/01/2021   10:24 AM 10/13/2020    3:49 PM 10/13/2020    2:37 PM 07/27/2019   10:43 AM  PHQ 2/9 Scores  PHQ - 2 Score 0 0 0 0 0 0 0  PHQ- 9 Score 0   1       Fall Risk    01/10/2023   11:32 AM 11/04/2022   10:59 AM 01/07/2022   11:16 AM 11/01/2021   10:24 AM 10/13/2020    3:49 PM  Fall Risk   Falls in the past year? 0 0 0 0 0  Number falls in past yr: 0 0 0 0 0  Injury with Fall? 0 0 0 0 0  Risk for fall due to :  No Fall Risks Impaired balance/gait No Fall Risks    Follow up Falls prevention discussed;Falls evaluation completed Falls evaluation completed Falls prevention discussed      MEDICARE RISK AT HOME: Medicare Risk at Home Any stairs in or around the home?: Yes If so, are there any without handrails?: Yes Home free of loose throw rugs in walkways, pet beds, electrical cords, etc?: Yes Adequate lighting in your home to reduce risk of falls?: Yes Life alert?: No Use of a cane, walker or w/c?: No Grab bars in the bathroom?: Yes Shower chair or bench in shower?: No Elevated toilet seat or a handicapped toilet?: No  TIMED UP AND GO:  Was the test performed?  No    Cognitive Function:        01/10/2023   11:38 AM 01/07/2022   11:21 AM  6CIT Screen  What Year? 0 points 0 points  What month? 0 points 0 points  What time? 0 points 0 points  Count back from 20 0 points 0 points  Months in reverse 0 points 0 points  Repeat phrase 0 points 0 points  Total Score 0 points 0 points    Immunizations Immunization History  Administered Date(s) Administered   Fluad Quad(high Dose 65+) 12/16/2018, 02/08/2020, 03/07/2021, 12/20/2021   Influenza Whole 01/06/2009   Influenza, High Dose Seasonal PF 06/21/2015, 01/06/2017, 12/31/2017   Influenza-Unspecified 01/06/2014   PFIZER Comirnaty(Gray Top)Covid-19 Tri-Sucrose Vaccine 07/21/2020   PFIZER(Purple Top)SARS-COV-2 Vaccination 04/28/2019, 05/17/2019, 12/14/2019   PNEUMOCOCCAL CONJUGATE-20 03/07/2021   Pfizer Covid-19 Vaccine Bivalent Booster 18yrs & up 03/16/2021   Pfizer(Comirnaty)Fall Seasonal Vaccine 12 years and older 01/23/2022   Pneumococcal Conjugate-13 11/07/2014   Pneumococcal Polysaccharide-23 11/17/2013   Tdap 08/01/2014   Zoster Recombinant(Shingrix) 08/02/2019    TDAP status: Up to date  Flu Vaccine status: Up to date  Pneumococcal vaccine status: Up to date  Covid-19 vaccine status: Information provided on how to obtain  vaccines.   Qualifies for Shingles Vaccine? Yes   Zostavax completed Yes   Shingrix Completed?: No.    Education has been provided regarding the importance of this vaccine. Patient has been advised to call insurance company to determine out of pocket expense if they have not yet received this vaccine. Advised may also receive vaccine at local pharmacy or Health Dept. Verbalized acceptance and understanding.  Screening Tests Health Maintenance  Topic Date Due   HEMOGLOBIN A1C  05/07/2023   OPHTHALMOLOGY EXAM  08/19/2023   Diabetic kidney evaluation - eGFR measurement  11/04/2023   Diabetic kidney evaluation - Urine ACR  11/04/2023   FOOT EXAM  11/04/2023   Medicare Annual Wellness (AWV)  01/10/2024   DTaP/Tdap/Td (2 - Td or Tdap) 07/31/2024   Colonoscopy  08/23/2027   Pneumonia Vaccine 15+ Years old  Completed   INFLUENZA VACCINE  Completed   Hepatitis C Screening  Completed   HPV VACCINES  Aged Out   COVID-19  Vaccine  Discontinued   Zoster Vaccines- Shingrix  Discontinued    Health Maintenance  There are no preventive care reminders to display for this patient.   Colorectal cancer screening: Type of screening: Colonoscopy. Completed 08/22/2017. Repeat every 10 years  Lung Cancer Screening: (Low Dose CT Chest recommended if Age 64-80 years, 20 pack-year currently smoking OR have quit w/in 15years.) does not qualify.   Lung Cancer Screening Referral: N/A  Additional Screening:  Hepatitis C Screening: does qualify; Completed 07/17/2016  Vision Screening: Recommended annual ophthalmology exams for early detection of glaucoma and other disorders of the eye. Is the patient up to date with their annual eye exam?  Yes  Who is the provider or what is the name of the office in which the patient attends annual eye exams? [facility] If pt is not established with a provider, would they like to be referred to a provider to establish care? No .   Dental  Screening: Recommended annual dental exams for proper oral hygiene  Diabetic Foot Exam: Diabetic Foot Exam: Completed 11/04/2022  Community Resource Referral / Chronic Care Management: CRR required this visit?  No   CCM required this visit?  No     Plan:     I have personally reviewed and noted the following in the patient's chart:   Medical and social history Use of alcohol, tobacco or illicit drugs  Current medications and supplements including opioid prescriptions. Patient is not currently taking opioid prescriptions. Functional ability and status Nutritional status Physical activity Advanced directives List of other physicians Hospitalizations, surgeries, and ER visits in previous 12 months Vitals Screenings to include cognitive, depression, and falls Referrals and appointments  In addition, I have reviewed and discussed with patient certain preventive protocols, quality metrics, and best practice recommendations. A written personalized care plan for preventive services as well as general preventive health recommendations were provided to patient.     Devonia Farro L Jamil Armwood, CMA   01/10/2023   After Visit Summary: (Mail) Due to this being a telephonic visit, the after visit summary with patients personalized plan was offered to patient via mail   Nurse Notes: Patient inquired about a Glucose kit.  He has not been checking his blood sugar at home due to him not having a glucose tester.  Patient is requesting for one to be sent in to his pharmacy on file.  He has no other concerns to address today.

## 2023-03-06 ENCOUNTER — Other Ambulatory Visit: Payer: Self-pay | Admitting: Internal Medicine

## 2023-03-10 ENCOUNTER — Other Ambulatory Visit: Payer: Self-pay

## 2023-03-11 ENCOUNTER — Other Ambulatory Visit: Payer: Self-pay | Admitting: Internal Medicine

## 2023-03-25 NOTE — Telephone Encounter (Signed)
Patient would like this sent again, pharmacy states that they do not have

## 2023-03-25 NOTE — Telephone Encounter (Signed)
Patient called again and would like this done

## 2023-03-31 ENCOUNTER — Telehealth: Payer: Self-pay | Admitting: Internal Medicine

## 2023-03-31 NOTE — Telephone Encounter (Signed)
Copied from CRM [id number]. Topic: General - Call Back - No Documentation >> Mar 31, 2023  3:45 PM Matthew Keller wrote: Reason for CRM: Pt is requesting a callback regarding his verapamil medication, he stated that he has contacted the office several times and would like a follow up.

## 2023-03-31 NOTE — Telephone Encounter (Signed)
A year supply was sent in on 03/10/2023.

## 2023-03-31 NOTE — Telephone Encounter (Signed)
Prescription Request  03/31/2023  LOV: 11/04/2022  What is the name of the medication or equipment? verapamil (CALAN-SR) 240 MG CR tablet   Have you contacted your pharmacy to request a refill? No   Which pharmacy would you like this sent to?  [id number] [place], [place] - [address] [place] [address] Phone: [phone] Fax: [phone]    Patient notified that their request is being sent to the clinical staff for review and that they should receive a response within 2 business days.   Please advise at Mobile [phone] (mobile)

## 2023-04-01 ENCOUNTER — Other Ambulatory Visit: Payer: Self-pay

## 2023-04-01 ENCOUNTER — Other Ambulatory Visit: Payer: Self-pay | Admitting: Internal Medicine

## 2023-04-01 NOTE — Telephone Encounter (Signed)
Refill has been resent. °

## 2023-04-02 MED ORDER — VERAPAMIL HCL ER 240 MG PO TBCR: 240.0000 mg | EXTENDED_RELEASE_TABLET | Freq: Every day | ORAL | 1 refills | Status: DC

## 2023-09-03 ENCOUNTER — Ambulatory Visit: Payer: Self-pay

## 2023-09-03 NOTE — Telephone Encounter (Signed)
 Chief Complaint: Back pain Symptoms: left flank pain Frequency: 2 weeks Pertinent Negatives: Patient denies blood in urine, burning with urination Disposition: [] ED /[] Urgent Care (no appt availability in office) / [x] Appointment(In office/virtual)/ []  [facility]/ [] Home Care/ [] Refused Recommended Disposition /[] [facility]/ []  Follow-up with PCP Additional Notes: Patient called in stating he is concerned he has back pain for two weeks, and knowingly has kidney issues. He is uncertain if it is coming from playing golf or if it is related to kidneys. Patient denies burning with urination, blood in urine, fever. Patient appt made for earliest availability with PCP - he is out of town until Tuesday. Patient advised to call back asap if symptoms worsen.   Reason for Disposition  [1] MODERATE back pain (e.g., interferes with normal activities) AND [2] present > 3 days  Answer Assessment - Initial Assessment Questions 1. ONSET: "When did the pain begin?"      2 weeks ago 2. LOCATION: "Where does it hurt?" (upper, mid or lower back)     Left flank pain 3. SEVERITY: "How bad is the pain?"  (e.g., Scale 1-10; mild, moderate, or severe)   - MILD (1-3): Doesn't interfere with normal activities.    - MODERATE (4-7): Interferes with normal activities or awakens from sleep.    - SEVERE (8-10): Excruciating pain, unable to do any normal activities.      Ranges 4. PATTERN: "Is the pain constant?" (e.g., yes, no; constant, intermittent)      Comes and goes 5. RADIATION: "Does the pain shoot into your legs or somewhere else?"     Sometimes leg goes numb 6. CAUSE:  "What do you think is causing the back pain?"      Patient uncertain if he caused it by playing golf 7. BACK OVERUSE:  "Any recent lifting of heavy objects, strenuous work or exercise?"     Patient played golf but also has kidney problems 8. MEDICINES: "What have you taken so far for the pain?" (e.g., nothing,  acetaminophen , NSAIDS)     Advil, motrin 9. NEUROLOGIC SYMPTOMS: "Do you have any weakness, numbness, or problems with bowel/bladder control?"     No 10. OTHER SYMPTOMS: "Do you have any other symptoms?" (e.g., fever, abdomen pain, burning with urination, blood in urine)       Leg goes numb when lying on side  Protocols used: Back Pain-A-AH

## 2023-09-08 ENCOUNTER — Other Ambulatory Visit: Payer: Self-pay | Admitting: Internal Medicine

## 2023-09-09 ENCOUNTER — Ambulatory Visit: Admitting: Internal Medicine

## 2023-09-10 ENCOUNTER — Ambulatory Visit: Payer: Self-pay | Admitting: Internal Medicine

## 2023-09-10 ENCOUNTER — Encounter: Payer: Self-pay | Admitting: Internal Medicine

## 2023-09-10 ENCOUNTER — Ambulatory Visit: Admitting: Internal Medicine

## 2023-09-10 VITALS — BP 134/82 | HR 60 | Temp 98.0°F | Ht 71.0 in | Wt 271.0 lb

## 2023-09-10 DIAGNOSIS — E1159 Type 2 diabetes mellitus with other circulatory complications: Secondary | ICD-10-CM

## 2023-09-10 DIAGNOSIS — E7800 Pure hypercholesterolemia, unspecified: Secondary | ICD-10-CM

## 2023-09-10 DIAGNOSIS — Z125 Encounter for screening for malignant neoplasm of prostate: Secondary | ICD-10-CM | POA: Diagnosis not present

## 2023-09-10 DIAGNOSIS — E1165 Type 2 diabetes mellitus with hyperglycemia: Secondary | ICD-10-CM

## 2023-09-10 DIAGNOSIS — Z0001 Encounter for general adult medical examination with abnormal findings: Secondary | ICD-10-CM

## 2023-09-10 DIAGNOSIS — E538 Deficiency of other specified B group vitamins: Secondary | ICD-10-CM | POA: Diagnosis not present

## 2023-09-10 DIAGNOSIS — E559 Vitamin D deficiency, unspecified: Secondary | ICD-10-CM | POA: Diagnosis not present

## 2023-09-10 DIAGNOSIS — Z7984 Long term (current) use of oral hypoglycemic drugs: Secondary | ICD-10-CM

## 2023-09-10 DIAGNOSIS — M5442 Lumbago with sciatica, left side: Secondary | ICD-10-CM | POA: Diagnosis not present

## 2023-09-10 LAB — MICROALBUMIN / CREATININE URINE RATIO
Creatinine,U: 185.2 mg/dL
Microalb Creat Ratio: 29.6 mg/g (ref 0.0–30.0)
Microalb, Ur: 5.5 mg/dL — ABNORMAL HIGH (ref 0.0–1.9)

## 2023-09-10 LAB — URINALYSIS, ROUTINE W REFLEX MICROSCOPIC
Bilirubin Urine: NEGATIVE
Hgb urine dipstick: NEGATIVE
Ketones, ur: NEGATIVE
Leukocytes,Ua: NEGATIVE
Nitrite: NEGATIVE
RBC / HPF: NONE SEEN (ref 0–?)
Specific Gravity, Urine: 1.025 (ref 1.000–1.030)
Urine Glucose: >=1000 — AB
Urobilinogen, UA: 0.2 (ref 0.0–1.0)
WBC, UA: NONE SEEN (ref 0–?)
pH: 6 (ref 5.0–8.0)

## 2023-09-10 LAB — CBC WITH DIFFERENTIAL/PLATELET
Basophils Absolute: 0 10*3/uL (ref 0.0–0.1)
Basophils Relative: 0.5 % (ref 0.0–3.0)
Eosinophils Absolute: 0.2 10*3/uL (ref 0.0–0.7)
Eosinophils Relative: 3.4 % (ref 0.0–5.0)
HCT: 43.8 % (ref 39.0–52.0)
Hemoglobin: 14.4 g/dL (ref 13.0–17.0)
Lymphocytes Relative: 23.3 % (ref 12.0–46.0)
Lymphs Abs: 1.7 10*3/uL (ref 0.7–4.0)
MCHC: 33 g/dL (ref 30.0–36.0)
MCV: 76.4 fl — ABNORMAL LOW (ref 78.0–100.0)
Monocytes Absolute: 0.5 10*3/uL (ref 0.1–1.0)
Monocytes Relative: 6.9 % (ref 3.0–12.0)
Neutro Abs: 4.8 10*3/uL (ref 1.4–7.7)
Neutrophils Relative %: 65.9 % (ref 43.0–77.0)
Platelets: 222 10*3/uL (ref 150.0–400.0)
RBC: 5.72 Mil/uL (ref 4.22–5.81)
RDW: 16.3 % — ABNORMAL HIGH (ref 11.5–15.5)
WBC: 7.3 10*3/uL (ref 4.0–10.5)

## 2023-09-10 LAB — BASIC METABOLIC PANEL WITH GFR
BUN: 14 mg/dL (ref 6–23)
CO2: 29 meq/L (ref 19–32)
Calcium: 10.3 mg/dL (ref 8.4–10.5)
Chloride: 104 meq/L (ref 96–112)
Creatinine, Ser: 1.42 mg/dL (ref 0.40–1.50)
GFR: 48.68 mL/min — ABNORMAL LOW (ref >60.00)
Glucose, Bld: 76 mg/dL (ref 70–99)
Potassium: 4.5 meq/L (ref 3.5–5.1)
Sodium: 140 meq/L (ref 135–145)

## 2023-09-10 LAB — LIPID PANEL
Cholesterol: 149 mg/dL (ref 0–200)
HDL: 47.5 mg/dL (ref >39.00)
LDL Cholesterol: 91 mg/dL (ref 0–99)
NonHDL: 101.7
Total CHOL/HDL Ratio: 3
Triglycerides: 55 mg/dL (ref 0.0–149.0)
VLDL: 11 mg/dL (ref 0.0–40.0)

## 2023-09-10 LAB — HEMOGLOBIN A1C: Hgb A1c MFr Bld: 5.9 % (ref 4.6–6.5)

## 2023-09-10 LAB — VITAMIN B12: Vitamin B-12: 328 pg/mL (ref 211–911)

## 2023-09-10 LAB — HEPATIC FUNCTION PANEL
ALT: 16 U/L (ref 0–53)
AST: 24 U/L (ref 0–37)
Albumin: 4.3 g/dL (ref 3.5–5.2)
Alkaline Phosphatase: 64 U/L (ref 39–117)
Bilirubin, Direct: 0.2 mg/dL (ref 0.0–0.3)
Total Bilirubin: 1.4 mg/dL — ABNORMAL HIGH (ref 0.2–1.2)
Total Protein: 7 g/dL (ref 6.0–8.3)

## 2023-09-10 LAB — TSH: TSH: 3.59 u[IU]/mL (ref 0.35–5.50)

## 2023-09-10 LAB — VITAMIN D 25 HYDROXY (VIT D DEFICIENCY, FRACTURES): VITD: 31.59 ng/mL (ref 30.00–100.00)

## 2023-09-10 LAB — PSA: PSA: 0.26 ng/mL (ref 0.10–4.00)

## 2023-09-10 MED ORDER — CYCLOBENZAPRINE HCL 5 MG PO TABS: 5.0000 mg | ORAL_TABLET | Freq: Three times a day (TID) | ORAL | 1 refills | Status: AC | PRN

## 2023-09-10 MED ORDER — SEMAGLUTIDE (1 MG/DOSE) 4 MG/3ML ~~LOC~~ SOPN: 1.0000 mg | PEN_INJECTOR | SUBCUTANEOUS | 11 refills | Status: DC

## 2023-09-10 NOTE — Assessment & Plan Note (Signed)
 Age and sex appropriate education and counseling updated with regular exercise and diet Referrals for preventative services - pt to call for eye appt soon Immunizations addressed - none needed Smoking counseling  - none needed Evidence for depression or other mood disorder - none significant Most recent labs reviewed. I have personally reviewed and have noted: 1) the patient's medical and social history 2) The patient's current medications and supplements 3) The patient's height, weight, and BMI have been recorded in the chart

## 2023-09-10 NOTE — Patient Instructions (Addendum)
 Please take all new medication as prescribed  - the muscle relaxer as needed  Ok to increase the ozempic  to 1 mg weekly to help with sugar and further wt loss  Please continue all other medications as before, and refills have been done if requested.  Please have the pharmacy call with any other refills you may need.  Please continue your efforts at being more active, low cholesterol diet, and weight control.  You are otherwise up to date with prevention measures today.  Please keep your appointments with your specialists as you may have planned  Please go to the LAB at the blood drawing area for the tests to be done  You will be contacted by phone if any changes need to be made immediately.  Otherwise, you will receive a letter about your results with an explanation, but please check with MyChart first.  Please make an Appointment to return in 6 months, or sooner if needed

## 2023-09-10 NOTE — Assessment & Plan Note (Signed)
 Lab Results  Component Value Date   VITAMINB12 357 11/04/2022   Stable, cont oral replacement - b12 1000 mcg qd

## 2023-09-10 NOTE — Assessment & Plan Note (Addendum)
 Lab Results  Component Value Date   HGBA1C 6.9 (H) 11/04/2022   Uncontrolled,=, pt to continue current medical treatment glucotrol  xl 25 mg every day, jardiance  25 mg every day, metformin  ER 500 mg - 1 every day, and increase ozempic  to 1 mg weekly

## 2023-09-10 NOTE — Progress Notes (Addendum)
 Patient ID: Matthew Keller, male   DOB: 01-25-1951, 73 y.o.   MRN: [id number]         Chief Complaint:: wellness exam and Back Pain (In the lower back and feeling numb on the left leg going down when laying down )  , left lower back pain.dm, low b12, hld       HPI:  Matthew Keller is a 73 y.o. male here for wellness exam; pt missed may 2025 eye appt but plans to reschedule; o/w up to date                        Also Pt denies chest pain, increased sob or doe, wheezing, orthopnea, PND, increased LE swelling, palpitations, dizziness or syncope.   Pt denies polydipsia, polyuria, or new focal neuro s/s.    Pt denies fever, wt loss, night sweats, loss of appetite, or other constitutional symptoms  Does have left sciatica at niight only with lying on left side only, so tries to avoid that.  Today has mild left lower lumbar paraspinal pain and soreness after playing golf recently.  No LE pai, numbness or weakness or falls.     Wt Readings from Last 3 Encounters:  09/10/23 271 lb (122.9 kg)  01/10/23 276 lb (125.2 kg)  11/04/22 283 lb (128.4 kg)   BP Readings from Last 3 Encounters:  09/10/23 134/82  11/04/22 122/78  07/08/22 (!) 170/96   Immunization History  Administered Date(s) Administered   Fluad Quad(high Dose 65+) 12/16/2018, 02/08/2020, 03/07/2021, 12/20/2021   Influenza Whole 01/06/2009   Influenza, High Dose Seasonal PF 06/21/2015, 01/06/2017, 12/31/2017, 12/25/2022   Influenza-Unspecified 01/06/2014   PFIZER Comirnaty(Gray Top)Covid-19 Tri-Sucrose Vaccine 07/21/2020   PFIZER(Purple Top)SARS-COV-2 Vaccination 04/28/2019, 05/17/2019, 12/14/2019   PNEUMOCOCCAL CONJUGATE-20 03/07/2021, 12/25/2022   Pfizer Covid-19 Vaccine Bivalent Booster 20yrs & up 03/16/2021   Pfizer(Comirnaty)Fall Seasonal Vaccine 12 years and older 01/23/2022   Pneumococcal Conjugate-13 11/07/2014   Pneumococcal Polysaccharide-23 11/17/2013   Tdap 08/01/2014   Zoster Recombinant(Shingrix) 08/02/2019   Health  Maintenance Due  Topic Date Due   HEMOGLOBIN A1C  05/07/2023   OPHTHALMOLOGY EXAM  08/19/2023      Past Medical History:  Diagnosis Date   Atypical chest pain    [facility] 08-03-01 nl coronaries and lv fn, lvedp 30.  cardiac workup by Dr. Avanell Bob 5/09 neg cardiolite for ischemia, rec rsik reduction   Chicken pox    Diverticulosis    colonoscopy 9/01........Aaron AasDr. Arvie Latus   DM (diabetes mellitus) [facility])    GERD (gastroesophageal reflux disease)    Healthcare maintenance    pneumovax 09/2006, Td 04/2004, CPX June 12, 2010   Hypertension    Microcytic anemia    Morbid obesity (HCC)    all time high 310 2009.  target wt = 208 for BMI <30.  referred back to nutrition again June 12, 2010   OSA (obstructive sleep apnea)    on CPAP, sleep study 09/2007............Aaron AasDr. 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 His  smoking use included cigarettes. He started smoking about 39 years ago. He has a 0.3 pack-year smoking history. He has never used smokeless tobacco. He reports current alcohol use. He reports that he does not use drugs. family history includes Alcohol abuse in his brother and father; Diabetes in his mother; Throat cancer in his brother. No Known Allergies Current Outpatient Medications on File Prior to Visit  Medication Sig Dispense Refill   acetaminophen  (TYLENOL ) 325 MG tablet Take 650 mg by mouth every 6 (six) hours as needed for moderate pain.      aspirin  81 MG chewable tablet Chew 1 tablet (81 mg total) by mouth 2 (two) times daily. 30 tablet 0   Cholecalciferol  50 MCG (2000 UT) TABS 1 tab by  mouth once daily 30 tablet 99   [employer] VITAMIN B12 1000 MCG tablet TAKE 1 TABLET BY MOUTH EVERY DAY 30 tablet 0   furosemide  (LASIX ) 20 MG tablet TAKE 1 TABLET BY MOUTH EVERY DAY 90 tablet 3   glipiZIDE  (GLUCOTROL  XL) 2.5 MG 24 hr tablet TAKE 1 TABLET (2.5 MG TOTAL) BY MOUTH DAILY WITH BREAKFAST. PATIENT NEEDS A ROUTINE WELL VISIT. 90 tablet 3   Insulin  Pen Needle 32G X 4 MM MISC Use to administer Victoza  everyday once daily E11.9 90 each 3   JARDIANCE  25 MG TABS tablet TAKE 1 TABLET (25 MG TOTAL) BY MOUTH DAILY. 90 tablet 3   Lancets (ONETOUCH ULTRASOFT) lancets Use as instructed 100 each 12   latanoprost  (XALATAN ) 0.005 % ophthalmic solution Place 1 drop into both eyes at bedtime.      levothyroxine  (SYNTHROID ) 75 MCG tablet TAKE 1 TABLET BY MOUTH EVERY DAY 90 tablet 3   losartan  (COZAAR ) 100 MG tablet TAKE 1 TABLET BY MOUTH EVERY DAY 90 tablet 3   metFORMIN  (GLUCOPHAGE -XR) 500 MG 24 hr tablet TAKE 1 TABLET BY MOUTH EVERY DAY WITH BREAKFAST 90 tablet 3   metoprolol  succinate (TOPROL -XL) 100 MG 24 hr tablet TAKE 1 TABLET BY MOUTH DAILY. TAKE WITH OR IMMEDIATELY FOLLOWING A MEAL. 90 tablet 3   minoxidil  (LONITEN ) 10 MG tablet TAKE 1 TAB IN THE MORNING & 1/2 TAB IN THE EVENING 135 tablet 3   omeprazole  (PRILOSEC) 40 MG capsule TAKE 1 CAPSULE BY MOUTH EVERY DAY 90 capsule 3   rosuvastatin  (CRESTOR ) 10 MG tablet TAKE 1 TABLET BY MOUTH EVERY DAY 90 tablet 3   verapamil  (CALAN -SR) 240 MG CR tablet Take 1 tablet (240 mg total) by mouth daily. 90 tablet 1   Blood Glucose Monitoring Suppl (ONETOUCH VERIO IQ SYSTEM) w/Device KIT Use to check sugars 1-2 times per day (Patient not taking: Reported on 01/10/2023) 1 kit 0   glucose blood (ONETOUCH VERIO) test strip Use to check sugars 1-2 times per day (Patient not taking: Reported on 01/10/2023) 100 each 12   No current facility-administered medications on file prior to visit.        ROS:  All others reviewed and negative.  Objective        PE:  BP 134/82  (BP Location: Right Arm, Patient Position: Sitting, Cuff Size: Normal)   Pulse 60   Temp 98 F (36.7 C) (Oral)   Ht 5\' 11"  (1.803 m)   Wt 271 lb (122.9 kg)   SpO2 97%   BMI 37.80 kg/m                 Constitutional: Pt appears in NAD  HENT: Head: NCAT.                Right Ear: External ear normal.                 Left Ear: External ear normal.                Eyes: . Pupils are equal, round, and reactive to light. Conjunctivae and EOM are normal               Nose: without d/c or deformity               Neck: Neck supple. Gross normal ROM               Cardiovascular: Normal rate and regular rhythm.                 Pulmonary/Chest: Effort normal and breath sounds without rales or wheezing.                Abd:  Soft, NT, ND, + BS, no organomegaly               Neurological: Pt is alert. At baseline orientation, motor grossly intact               Skin: Skin is warm. No rashes, no other new lesions, LE edema - trace bilateral               Psychiatric: Pt behavior is normal without agitation   Micro: none  Cardiac tracings I have personally interpreted today:  none  Pertinent Radiological findings (summarize): none   Lab Results  Component Value Date   WBC 7.6 11/04/2022   HGB 14.2 11/04/2022   HCT 43.3 11/04/2022   PLT 231.0 11/04/2022   GLUCOSE 106 (H) 11/04/2022   CHOL 122 11/04/2022   TRIG 88.0 11/04/2022   HDL 45.50 11/04/2022   LDLCALC 59 11/04/2022   ALT 16 11/04/2022   AST 21 11/04/2022   NA 140 11/04/2022   K 4.0 11/04/2022   CL 104 11/04/2022   CREATININE 1.49 11/04/2022   BUN 15 11/04/2022   CO2 28 11/04/2022   TSH 3.64 11/04/2022   PSA 0.26 11/04/2022   INR 1.0 08/03/2019   HGBA1C 6.9 (H) 11/04/2022   MICROALBUR 0.9 11/04/2022   Assessment/Plan:  JACION DISMORE is a 73 y.o. Black or African American [2] male with  has a past medical history of Atypical chest pain, Chicken pox, Diverticulosis, DM (diabetes mellitus) (HCC), GERD  (gastroesophageal reflux disease), Healthcare maintenance, Hypertension, Microcytic anemia, Morbid obesity (HCC), OSA (obstructive sleep apnea), Renal insufficiency, Sleep apnea, and Thyroid  disease.  Encounter for well adult exam with abnormal findings Age and sex appropriate education and counseling updated with regular exercise and diet Referrals for preventative services - pt to call for eye appt soon Immunizations addressed - none needed Smoking counseling  - none needed Evidence for depression or other mood disorder - none significant Most recent labs reviewed. I have personally reviewed and have noted: 1) the patient's medical and social history 2) The patient's current medications and supplements 3) The patient's height, weight, and BMI have been recorded in the chart   B12 deficiency Lab Results  Component Value Date   VITAMINB12 357 11/04/2022   Stable, cont oral replacement - b12 1000 mcg qd   HYPERCHOLESTEROLEMIA Lab Results  Component Value Date   LDLCALC 59 11/04/2022   Stable, pt to continue current statin crestor  10 mg  qd   Poorly controlled type 2 diabetes mellitus with circulatory disorder (HCC) Lab Results  Component Value Date   HGBA1C 6.9 (H) 11/04/2022   Uncontrolled,=, pt to continue current medical treatment glucotrol  xl 25 mg every day, jardiance  25 mg every day, metformin  ER 500 mg - 1 every day, and increase ozempic  to 1 mg weekly   Vitamin D  deficiency Last vitamin D  Lab Results  Component Value Date   VD25OH 35.78 11/04/2022   Low, to start oral replacement   Low back pain Current left lower back mild spasm msk strain - for flexeril 5 tid prn  Followup: Return in about 6 months (around 03/11/2024).  Rosalia Colonel, MD 09/10/2023 12:45 PM [facility]

## 2023-09-10 NOTE — Addendum Note (Signed)
 Addended by: Roslyn Coombe on: 09/10/2023 12:45 PM   Modules accepted: Level of Service

## 2023-09-10 NOTE — Assessment & Plan Note (Signed)
 Current left lower back mild spasm msk strain - for flexeril 5 tid prn

## 2023-09-10 NOTE — Assessment & Plan Note (Signed)
 Lab Results  Component Value Date   LDLCALC 59 11/04/2022   Stable, pt to continue current statin crestor  10 mg qd

## 2023-09-10 NOTE — Assessment & Plan Note (Signed)
 Last vitamin D  Lab Results  Component Value Date   VD25OH 35.78 11/04/2022   Low, to start oral replacement

## 2023-10-01 DIAGNOSIS — M545 Low back pain, unspecified: Secondary | ICD-10-CM | POA: Diagnosis not present

## 2023-10-08 ENCOUNTER — Other Ambulatory Visit: Payer: Self-pay

## 2023-10-08 ENCOUNTER — Other Ambulatory Visit: Payer: Self-pay | Admitting: Internal Medicine

## 2023-10-08 DIAGNOSIS — M4726 Other spondylosis with radiculopathy, lumbar region: Secondary | ICD-10-CM | POA: Diagnosis not present

## 2023-12-02 ENCOUNTER — Other Ambulatory Visit: Payer: Self-pay | Admitting: Internal Medicine

## 2024-01-15 ENCOUNTER — Encounter

## 2024-01-22 ENCOUNTER — Other Ambulatory Visit: Payer: Self-pay

## 2024-01-22 ENCOUNTER — Other Ambulatory Visit: Payer: Self-pay | Admitting: Internal Medicine

## 2024-02-12 ENCOUNTER — Other Ambulatory Visit: Payer: Self-pay | Admitting: Internal Medicine

## 2024-02-21 ENCOUNTER — Other Ambulatory Visit: Payer: Self-pay | Admitting: Internal Medicine

## 2024-02-23 ENCOUNTER — Other Ambulatory Visit: Payer: Self-pay

## 2024-03-18 ENCOUNTER — Telehealth: Payer: Self-pay

## 2024-03-18 NOTE — Telephone Encounter (Signed)
 Copied from CRM #[id number]. Topic: Appointments - Scheduling Inquiry for Clinic >> Mar 18, 2024 12:04 PM Roselie BROCKS wrote: Reason for CRM: Patients states his AWV that was supposed to have been on Oct 9,2025 was cancelled and is calling to reschedule but its showing completed on appnt but also reschedule on notes, its not allowing  me to reschedule for this year . Please reach out to patient concerning this

## 2024-04-07 ENCOUNTER — Ambulatory Visit (INDEPENDENT_AMBULATORY_CARE_PROVIDER_SITE_OTHER)

## 2024-04-07 VITALS — Ht 71.0 in | Wt 252.0 lb

## 2024-04-07 DIAGNOSIS — Z0000 Encounter for general adult medical examination without abnormal findings: Secondary | ICD-10-CM

## 2024-04-07 DIAGNOSIS — E1159 Type 2 diabetes mellitus with other circulatory complications: Secondary | ICD-10-CM | POA: Diagnosis not present

## 2024-04-07 DIAGNOSIS — E1165 Type 2 diabetes mellitus with hyperglycemia: Secondary | ICD-10-CM | POA: Diagnosis not present

## 2024-04-07 NOTE — Patient Instructions (Signed)
 Mr. Matthew Keller,  Thank you for taking the time for your Medicare Wellness Visit. I appreciate your continued commitment to your health goals. Please review the care plan we discussed, and feel free to reach out if I can assist you further.  Please note that Annual Wellness Visits do not include a physical exam. Some assessments may be limited, especially if the visit was conducted virtually. If needed, we may recommend an in-person follow-up with your provider.  Ongoing Care Seeing your primary care provider every 3 to 6 months helps us  monitor your health and provide consistent, personalized care.   Referrals If a referral was made during today's visit and you haven't received any updates within two weeks, please contact the referred provider directly to check on the status.  Recommended Screenings:  Health Maintenance  Topic Date Due   Eye exam for diabetics  08/19/2023   Hemoglobin A1C  03/11/2024   DTaP/Tdap/Td vaccine (2 - Td or Tdap) 07/31/2024   Yearly kidney function blood test for diabetes  09/09/2024   Yearly kidney health urinalysis for diabetes  09/09/2024   Complete foot exam   09/09/2024   Medicare Annual Wellness Visit  04/07/2025   Colon Cancer Screening  08/23/2027   Pneumococcal Vaccine for age over 62  Completed   Flu Shot  Completed   Hepatitis C Screening  Completed   Meningitis B Vaccine  Aged Out   COVID-19 Vaccine  Discontinued   Zoster (Shingles) Vaccine  Discontinued       01/10/2023   11:32 AM  Advanced Directives  Does Patient Have a Medical Advance Directive? No  Type of Estate Agent of [place];Living will  Copy of Healthcare Power of Attorney in Chart? No - copy requested    Vision: Annual vision screenings are recommended for early detection of glaucoma, cataracts, and diabetic retinopathy. These exams can also reveal signs of chronic conditions such as diabetes and high blood pressure.  Dental: Annual dental screenings help  detect early signs of oral cancer, gum disease, and other conditions linked to overall health, including heart disease and diabetes.

## 2024-04-07 NOTE — Progress Notes (Addendum)
 "  Chief Complaint  Patient presents with   Medicare Wellness     Subjective:   Matthew Keller is a 73 y.o. male who presents for a Medicare Annual Wellness Visit.  Visit info / Clinical Intake: Medicare Wellness Visit Type:: Subsequent Annual Wellness Visit Persons participating in visit and providing information:: patient Medicare Wellness Visit Mode:: Telephone If telephone:: video declined Since this visit was completed virtually, some vitals may be partially provided or unavailable. Missing vitals are due to the limitations of the virtual format.: Documented vitals are patient reported If Telephone or Video please confirm:: I connected with patient using audio/video enable telemedicine. I verified patient identity with two identifiers, discussed telehealth limitations, and patient agreed to proceed. Patient Location:: Home Provider Location:: Office Interpreter Needed?: No Pre-visit prep was completed: yes AWV questionnaire completed by patient prior to visit?: no Living arrangements:: lives with spouse/significant other Patient's Overall Health Status Rating: good Typical amount of pain: none Does pain affect daily life?: no Are you currently prescribed opioids?: no  Dietary Habits and Nutritional Risks How many meals a day?: 2 Eats fruit and vegetables daily?: yes Most meals are obtained by: preparing own meals; having others provide food; eating out In the last 2 weeks, have you had any of the following?: none Diabetic:: (!) yes Any non-healing wounds?: no How often do you check your BS?: 0 (needs a Glucometer) Would you like to be referred to a Nutritionist or for Diabetic Management? : no  Functional Status Activities of Daily Living (to include ambulation/medication): Independent Ambulation: Independent with device- listed below Home Assistive Devices/Equipment: Eyeglasses; Dentures (specify type); CPAP Medication Administration: Independent Home Management  (perform basic housework or laundry): Independent Manage your own finances?: yes Primary transportation is: driving Concerns about vision?: no *vision screening is required for WTM* Concerns about hearing?: no  Fall Screening Falls in the past year?: 0 Number of falls in past year: 0 Was there an injury with Fall?: 0 Fall Risk Category Calculator: 0 Patient Fall Risk Level: Low Fall Risk  Fall Risk Patient at Risk for Falls Due to: No Fall Risks Fall risk Follow up: Falls evaluation completed; Falls prevention discussed  Home and Transportation Safety: All rugs have non-skid backing?: N/A, no rugs All stairs or steps have railings?: yes Grab bars in the bathtub or shower?: yes Have non-skid surface in bathtub or shower?: yes Good home lighting?: yes Regular seat belt use?: yes Hospital stays in the last year:: no  Cognitive Assessment Difficulty concentrating, remembering, or making decisions? : no Will 6CIT or Mini Cog be Completed: yes What year is it?: 0 points What month is it?: 0 points Give patient an address phrase to remember (5 components): [address] [place], [place] About what time is it?: 0 points Count backwards from 20 to 1: 0 points Say the months of the year in reverse: 0 points Repeat the address phrase from earlier: 0 points 6 CIT Score: 0 points  Advance Directives (For Healthcare) Does Patient Have a Medical Advance Directive?: Yes Does patient want to make changes to medical advance directive?: Yes (Inpatient - patient requests chaplain consult to change a medical advance directive) Type of Advance Directive: Healthcare Power of [place]; Living will Copy of Healthcare Power of Attorney in Chart?: No - copy requested Copy of Living Will in Chart?: No - copy requested  Reviewed/Updated  Reviewed/Updated: Reviewed All (Medical, Surgical, Family, Medications, Allergies, Care Teams, Patient Goals)    Allergies (verified) Patient has no known  allergies.   Current Medications (verified) Outpatient Encounter Medications as of 04/07/2024  Medication Sig   acetaminophen  (TYLENOL ) 325 MG tablet Take 650 mg by mouth every 6 (six) hours as needed for moderate pain.    aspirin  81 MG chewable tablet Chew 1 tablet (81 mg total) by mouth 2 (two) times daily.   Cholecalciferol  50 MCG (2000 UT) TABS 1 tab by mouth once daily   [facility] VITAMIN B12 1000 MCG tablet TAKE 1 TABLET BY MOUTH EVERY DAY   cyclobenzaprine  (FLEXERIL ) 5 MG tablet Take 1 tablet (5 mg total) by mouth 3 (three) times daily as needed.   furosemide  (LASIX ) 20 MG tablet TAKE 1 TABLET BY MOUTH EVERY DAY   glipiZIDE  (GLUCOTROL  XL) 2.5 MG 24 hr tablet TAKE 1 TABLET BY MOUTH DAILY WITH BREAKFAST   glucose blood (ONETOUCH VERIO) test strip Use to check sugars 1-2 times per day   Insulin  Pen Needle 32G X 4 MM MISC Use to administer Victoza  everyday once daily E11.9   JARDIANCE  25 MG TABS tablet TAKE 1 TABLET (25 MG TOTAL) BY MOUTH DAILY.   Lancets (ONETOUCH ULTRASOFT) lancets Use as instructed   latanoprost  (XALATAN ) 0.005 % ophthalmic solution Place 1 drop into both eyes at bedtime.    levothyroxine  (SYNTHROID ) 75 MCG tablet TAKE 1 TABLET BY MOUTH EVERY DAY   losartan  (COZAAR ) 100 MG tablet TAKE 1 TABLET BY MOUTH EVERY DAY   metFORMIN  (GLUCOPHAGE -XR) 500 MG 24 hr tablet TAKE 1 TABLET BY MOUTH EVERY DAY WITH BREAKFAST   metoprolol  succinate (TOPROL -XL) 100 MG 24 hr tablet TAKE 1 TABLET BY MOUTH DAILY. TAKE WITH OR IMMEDIATELY FOLLOWING A MEAL.   minoxidil  (LONITEN ) 10 MG tablet TAKE 1 TAB IN THE MORNING & 1/2 TAB IN THE EVENING   omeprazole  (PRILOSEC) 40 MG capsule TAKE 1 CAPSULE BY MOUTH EVERY DAY   rosuvastatin  (CRESTOR ) 10 MG tablet TAKE 1 TABLET BY MOUTH EVERY DAY   Semaglutide , 1 MG/DOSE, 4 MG/3ML SOPN Inject 1 mg as directed once a week.   verapamil  (CALAN -SR) 240 MG CR tablet TAKE 1 TABLET BY MOUTH EVERY DAY   Blood Glucose Monitoring Suppl (ONETOUCH VERIO IQ SYSTEM) w/Device  KIT Use to check sugars 1-2 times per day (Patient not taking: Reported on 04/07/2024)   No facility-administered encounter medications on file as of 04/07/2024.    History: Past Medical History:  Diagnosis Date   Atypical chest pain    LHC 08-03-01 nl coronaries and lv fn, lvedp 30.  cardiac workup by Dr. Okey 5/09 neg cardiolite for ischemia, rec rsik reduction   Chicken pox    Diverticulosis    colonoscopy 9/01........SABRADr. Debrah   DM (diabetes mellitus) [facility])    GERD (gastroesophageal reflux disease)    Healthcare maintenance    pneumovax 09/2006, Td 04/2004, CPX June 12, 2010   Hypertension    Microcytic anemia    Morbid obesity (HCC)    all time high 310 2009.  target wt = 208 for BMI <30.  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)   Renal insufficiency    baseline 1.6 October 24, 2009 > 1.3 June 12, 2010   Sleep apnea    Thyroid  disease    Past Surgical History:  Procedure Laterality Date   KNEE SURGERY     TOTAL KNEE ARTHROPLASTY Right 08/10/2019   Procedure: RIGHT TOTAL KNEE ARTHROPLASTY;  Surgeon:  Dalldorf, Peter, MD;  Location: [facility];  Service: Orthopedics;  Laterality: Right;   Family History  Problem Relation Age of Onset   Diabetes Mother    Alcohol abuse Father    Throat cancer Brother        smoker   Alcohol abuse Brother    Colon cancer Neg Hx    Esophageal cancer Neg Hx    Liver cancer Neg Hx    Pancreatic cancer Neg Hx    Rectal cancer Neg Hx    Stomach cancer Neg Hx    Social History   Occupational History   Occupation: Midwife for [employer]: [employer]  Tobacco Use   Smoking status: Former    Current packs/day: 0.00    Average packs/day: 0.3 packs/day for 1 year (0.3 ttl pk-yrs)    Types: Cigarettes    Start date: 04/08/1984    Quit date: 04/08/1985    Years since quitting: 39.0   Smokeless tobacco:  Never  Vaping Use   Vaping status: Never Used  Substance and Sexual Activity   Alcohol use: Yes    Alcohol/week: 1.0 standard drink of alcohol    Types: 1 Cans of beer per week    Comment: occasional   Drug use: No   Sexual activity: Yes   Tobacco Counseling Counseling given: Yes  SDOH Screenings   Food Insecurity: No Food Insecurity (04/07/2024)  Housing: Unknown (04/07/2024)  Transportation Needs: No Transportation Needs (04/07/2024)  Utilities: Not At Risk (04/07/2024)  Alcohol Screen: Low Risk (01/10/2023)  Depression (PHQ2-9): Low Risk (04/07/2024)  Financial Resource Strain: Medium Risk (01/10/2023)  Physical Activity: Sufficiently Active (04/07/2024)  Social Connections: Moderately Integrated (04/07/2024)  Stress: No Stress Concern Present (04/07/2024)  Tobacco Use: Medium Risk (04/07/2024)  Health Literacy: Adequate Health Literacy (04/07/2024)   See flowsheets for full screening details  Depression Screen PHQ 2 & 9 Depression Scale- Over the past 2 weeks, how often have you been bothered by any of the following problems? Little interest or pleasure in doing things: 0 Feeling down, depressed, or hopeless (PHQ Adolescent also includes...irritable): 0 PHQ-2 Total Score: 0 Trouble falling or staying asleep, or sleeping too much: 0 Feeling tired or having little energy: 0 Poor appetite or overeating (PHQ Adolescent also includes...weight loss): 0 Feeling bad about yourself - or that you are a failure or have let yourself or your family down: 0 Trouble concentrating on things, such as reading the newspaper or watching television (PHQ Adolescent also includes...like school work): 0 Moving or speaking so slowly that other people could have noticed. Or the opposite - being so fidgety or restless that you have been moving around a lot more than usual: 0 Thoughts that you would be better off dead, or of hurting yourself in some way: 0 PHQ-9 Total Score: 0 If you checked off  any problems, how difficult have these problems made it for you to do your work, take care of things at home, or get along with other people?: Not difficult at all  Depression Treatment Depression Interventions/Treatment : EYV7-0 Score <4 Follow-up Not Indicated     Goals Addressed               This Visit's Progress     Patient Stated (pt-stated)        Patient stated he plans to stay active              Objective:    Today's  Vitals   04/07/24 0824  Weight: 252 lb (114.3 kg)  Height: 5' 11 (1.803 m)   Body mass index is 35.15 kg/m.  Hearing/Vision screen Hearing Screening - Comments:: Denies hearing difficulties   Vision Screening - Comments:: Wears eyeglasses for reading - up to date with routine eye exams with Ozell Bertin Immunizations and Health Maintenance Health Maintenance  Topic Date Due   OPHTHALMOLOGY EXAM  08/19/2023   HEMOGLOBIN A1C  03/11/2024   DTaP/Tdap/Td (2 - Td or Tdap) 07/31/2024   Diabetic kidney evaluation - eGFR measurement  09/09/2024   Diabetic kidney evaluation - Urine ACR  09/09/2024   FOOT EXAM  09/09/2024   Medicare Annual Wellness (AWV)  04/07/2025   Colonoscopy  08/23/2027   Pneumococcal Vaccine: 50+ Years  Completed   Influenza Vaccine  Completed   Hepatitis C Screening  Completed   Meningococcal B Vaccine  Aged Out   COVID-19 Vaccine  Discontinued   Zoster Vaccines- Shingrix  Discontinued        Assessment/Plan:  This is a routine wellness examination for Matthew Keller.  Hemoglobin A1C: ordered today  Patient Care Team: Norleen Lynwood ORN, MD as PCP - General (Internal Medicine) Bertin Ozell, MD as Consulting Physician (Ophthalmology)  I have personally reviewed and noted the following in the patients chart:   Medical and social history Use of alcohol, tobacco or illicit drugs  Current medications and supplements including opioid prescriptions. Functional ability and status Nutritional status Physical  activity Advanced directives List of other physicians Hospitalizations, surgeries, and ER visits in previous 12 months Vitals Screenings to include cognitive, depression, and falls Referrals and appointments  Orders Placed This Encounter  Procedures   Hemoglobin A1c    Standing Status:   Future    Expiration Date:   04/07/2025   In addition, I have reviewed and discussed with patient certain preventive protocols, quality metrics, and best practice recommendations. A written personalized care plan for preventive services as well as general preventive health recommendations were provided to patient.   Matthew Keller, CMA   04/07/2024   Return in 1 year (on 04/07/2025).  After Visit Summary: (Declined) Due to this being a telephonic visit, with patients personalized plan was offered to patient but patient Declined AVS at this time   Nurse Notes: scheduled 6-mth Diabetes f/u appt w/PCP (also needs Glucometer) for 04/2024 "

## 2024-04-21 ENCOUNTER — Ambulatory Visit: Admitting: Internal Medicine

## 2024-04-21 ENCOUNTER — Encounter: Payer: Self-pay | Admitting: Internal Medicine

## 2024-04-21 VITALS — BP 136/80 | HR 60 | Temp 98.3°F | Ht 71.0 in | Wt 270.0 lb

## 2024-04-21 DIAGNOSIS — R809 Proteinuria, unspecified: Secondary | ICD-10-CM

## 2024-04-21 DIAGNOSIS — E1122 Type 2 diabetes mellitus with diabetic chronic kidney disease: Secondary | ICD-10-CM | POA: Diagnosis not present

## 2024-04-21 DIAGNOSIS — Z125 Encounter for screening for malignant neoplasm of prostate: Secondary | ICD-10-CM

## 2024-04-21 DIAGNOSIS — E213 Hyperparathyroidism, unspecified: Secondary | ICD-10-CM

## 2024-04-21 DIAGNOSIS — Z0000 Encounter for general adult medical examination without abnormal findings: Secondary | ICD-10-CM

## 2024-04-21 DIAGNOSIS — E1159 Type 2 diabetes mellitus with other circulatory complications: Secondary | ICD-10-CM

## 2024-04-21 DIAGNOSIS — E039 Hypothyroidism, unspecified: Secondary | ICD-10-CM

## 2024-04-21 DIAGNOSIS — E785 Hyperlipidemia, unspecified: Secondary | ICD-10-CM

## 2024-04-21 DIAGNOSIS — Z7984 Long term (current) use of oral hypoglycemic drugs: Secondary | ICD-10-CM

## 2024-04-21 DIAGNOSIS — I10 Essential (primary) hypertension: Secondary | ICD-10-CM

## 2024-04-21 DIAGNOSIS — N1831 Chronic kidney disease, stage 3a: Secondary | ICD-10-CM

## 2024-04-21 DIAGNOSIS — Z0001 Encounter for general adult medical examination with abnormal findings: Secondary | ICD-10-CM

## 2024-04-21 DIAGNOSIS — E559 Vitamin D deficiency, unspecified: Secondary | ICD-10-CM

## 2024-04-21 DIAGNOSIS — Z7985 Long-term (current) use of injectable non-insulin antidiabetic drugs: Secondary | ICD-10-CM

## 2024-04-21 LAB — URINALYSIS, ROUTINE W REFLEX MICROSCOPIC
Bilirubin Urine: NEGATIVE
Hgb urine dipstick: NEGATIVE
Ketones, ur: NEGATIVE
Leukocytes,Ua: NEGATIVE
Nitrite: NEGATIVE
RBC / HPF: NONE SEEN
Specific Gravity, Urine: 1.02 (ref 1.000–1.030)
Total Protein, Urine: 30 — AB
Urine Glucose: >=1000 — AB
Urobilinogen, UA: 1 (ref 0.0–1.0)
WBC, UA: NONE SEEN
pH: 6 (ref 5.0–8.0)

## 2024-04-21 LAB — CBC WITH DIFFERENTIAL/PLATELET
Basophils Absolute: 0 K/uL (ref 0.0–0.1)
Basophils Relative: 0.5 % (ref 0.0–3.0)
Eosinophils Absolute: 0.3 K/uL (ref 0.0–0.7)
Eosinophils Relative: 3.8 % (ref 0.0–5.0)
HCT: 40.1 % (ref 39.0–52.0)
Hemoglobin: 13.4 g/dL (ref 13.0–17.0)
Lymphocytes Relative: 22.7 % (ref 12.0–46.0)
Lymphs Abs: 1.5 K/uL (ref 0.7–4.0)
MCHC: 33.5 g/dL (ref 30.0–36.0)
MCV: 76.1 fl — ABNORMAL LOW (ref 78.0–100.0)
Monocytes Absolute: 0.5 K/uL (ref 0.1–1.0)
Monocytes Relative: 7.7 % (ref 3.0–12.0)
Neutro Abs: 4.4 K/uL (ref 1.4–7.7)
Neutrophils Relative %: 65.3 % (ref 43.0–77.0)
Platelets: 216 K/uL (ref 150.0–400.0)
RBC: 5.26 Mil/uL (ref 4.22–5.81)
RDW: 15.9 % — ABNORMAL HIGH (ref 11.5–15.5)
WBC: 6.7 K/uL (ref 4.0–10.5)

## 2024-04-21 LAB — BASIC METABOLIC PANEL WITH GFR
BUN: 13 mg/dL (ref 6–23)
CO2: 30 meq/L (ref 19–32)
Calcium: 10 mg/dL (ref 8.4–10.5)
Chloride: 106 meq/L (ref 96–112)
Creatinine, Ser: 1.41 mg/dL (ref 0.40–1.50)
GFR: 48.88 mL/min — ABNORMAL LOW
Glucose, Bld: 71 mg/dL (ref 70–99)
Potassium: 4.4 meq/L (ref 3.5–5.1)
Sodium: 140 meq/L (ref 135–145)

## 2024-04-21 LAB — MICROALBUMIN / CREATININE URINE RATIO
Creatinine,U: 174.8 mg/dL
Microalb Creat Ratio: 70 mg/g — ABNORMAL HIGH (ref 0.0–30.0)
Microalb, Ur: 12.2 mg/dL — ABNORMAL HIGH (ref 0.7–1.9)

## 2024-04-21 LAB — HEPATIC FUNCTION PANEL
ALT: 17 U/L (ref 3–53)
AST: 19 U/L (ref 5–37)
Albumin: 4.1 g/dL (ref 3.5–5.2)
Alkaline Phosphatase: 65 U/L (ref 39–117)
Bilirubin, Direct: 0.3 mg/dL (ref 0.1–0.3)
Total Bilirubin: 1.1 mg/dL (ref 0.2–1.2)
Total Protein: 6.7 g/dL (ref 6.0–8.3)

## 2024-04-21 LAB — LIPID PANEL
Cholesterol: 119 mg/dL (ref 28–200)
HDL: 47.4 mg/dL
LDL Cholesterol: 61 mg/dL (ref 10–99)
NonHDL: 71.21
Total CHOL/HDL Ratio: 3
Triglycerides: 51 mg/dL (ref 10.0–149.0)
VLDL: 10.2 mg/dL (ref 0.0–40.0)

## 2024-04-21 LAB — PSA: PSA: 0.28 ng/mL (ref 0.10–4.00)

## 2024-04-21 LAB — TSH: TSH: 6.42 u[IU]/mL — ABNORMAL HIGH (ref 0.35–5.50)

## 2024-04-21 LAB — HEMOGLOBIN A1C: Hgb A1c MFr Bld: 6 % (ref 4.6–6.5)

## 2024-04-21 MED ORDER — ACCU-CHEK GUIDE TEST VI STRP: ORAL_STRIP | 12 refills | Status: AC

## 2024-04-21 MED ORDER — VERAPAMIL HCL ER 240 MG PO TBCR: 240.0000 mg | EXTENDED_RELEASE_TABLET | Freq: Every day | ORAL | 1 refills | Status: AC

## 2024-04-21 MED ORDER — GLIPIZIDE ER 2.5 MG PO TB24: 2.5000 mg | ORAL_TABLET | Freq: Every day | ORAL | 3 refills | Status: AC

## 2024-04-21 MED ORDER — ACCU-CHEK GUIDE ME W/DEVICE KIT: PACK | 0 refills | Status: AC

## 2024-04-21 MED ORDER — METFORMIN HCL ER 500 MG PO TB24: ORAL_TABLET | ORAL | 3 refills | Status: AC

## 2024-04-21 MED ORDER — LOSARTAN POTASSIUM 100 MG PO TABS: ORAL_TABLET | ORAL | 3 refills | Status: AC

## 2024-04-21 MED ORDER — EMPAGLIFLOZIN 25 MG PO TABS: 25.0000 mg | ORAL_TABLET | Freq: Every day | ORAL | 3 refills | Status: AC

## 2024-04-21 MED ORDER — SEMAGLUTIDE (1 MG/DOSE) 4 MG/3ML ~~LOC~~ SOPN: 1.0000 mg | PEN_INJECTOR | SUBCUTANEOUS | 11 refills | Status: AC

## 2024-04-21 MED ORDER — FUROSEMIDE 20 MG PO TABS: 20.0000 mg | ORAL_TABLET | Freq: Every day | ORAL | 3 refills | Status: AC

## 2024-04-21 MED ORDER — LEVOTHYROXINE SODIUM 75 MCG PO TABS: 75.0000 ug | ORAL_TABLET | Freq: Every day | ORAL | 3 refills | Status: DC

## 2024-04-21 MED ORDER — MINOXIDIL 10 MG PO TABS: ORAL_TABLET | ORAL | 3 refills | Status: AC

## 2024-04-21 MED ORDER — ROSUVASTATIN CALCIUM 10 MG PO TABS: 10.0000 mg | ORAL_TABLET | Freq: Every day | ORAL | 3 refills | Status: AC

## 2024-04-21 MED ORDER — OMEPRAZOLE 40 MG PO CPDR: 40.0000 mg | DELAYED_RELEASE_CAPSULE | Freq: Every day | ORAL | 3 refills | Status: AC

## 2024-04-21 NOTE — Progress Notes (Signed)
 Patient ID: ROCKFORD LEINEN, male   DOB: 03-21-1951, 74 y.o.   MRN: [id number]         Chief Complaint:: wellness exam and low vit d, dm with ckd3a, low thyroid ,htn, hld,         HPI:  TEX CONROY is a 74 y.o. male here for wellness'; pt plans to call for optho, o/w up to date                        Also Pt denies chest pain, increased sob or doe, wheezing, orthopnea, PND, increased LE swelling, palpitations, dizziness or syncope.   Pt denies polydipsia, polyuria, or new focal neuro s/s.    Pt denies fever, wt loss, night sweats, loss of appetite, or other constitutional symptoms  Does have recurring right sciatica.     Wt Readings from Last 3 Encounters:  04/21/24 270 lb (122.5 kg)  04/07/24 252 lb (114.3 kg)  09/10/23 271 lb (122.9 kg)   BP Readings from Last 3 Encounters:  04/21/24 136/80  09/10/23 134/82  11/04/22 122/78   Immunization History  Administered Date(s) Administered   Fluad Quad(high Dose 65+) 12/16/2018, 02/08/2020, 03/07/2021, 12/20/2021   Fluad Trivalent(High Dose 65+) 01/19/2024   INFLUENZA, HIGH DOSE SEASONAL PF 06/21/2015, 01/06/2017, 12/31/2017, 12/25/2022   Influenza Whole 01/06/2009   Influenza-Unspecified 01/06/2014   PFIZER Comirnaty(Gray Top)Covid-19 Tri-Sucrose Vaccine 07/21/2020   PFIZER(Purple Top)SARS-COV-2 Vaccination 04/28/2019, 05/17/2019, 12/14/2019   PNEUMOCOCCAL CONJUGATE-20 03/07/2021, 12/25/2022   Pfizer Covid-19 Vaccine Bivalent Booster 42yrs & up 03/16/2021   Pfizer(Comirnaty)Fall Seasonal Vaccine 12 years and older 01/23/2022, 01/22/2024   Pneumococcal Conjugate-13 11/07/2014   Pneumococcal Polysaccharide-23 11/17/2013   Tdap 08/01/2014   Zoster Recombinant(Shingrix) 08/02/2019   Health Maintenance Due  Topic Date Due   OPHTHALMOLOGY EXAM  08/19/2023      Past Medical History:  Diagnosis Date   Atypical chest pain    LHC 08-03-01 nl coronaries and lv fn, lvedp 30.  cardiac workup by Dr. Okey 5/09 neg cardiolite for ischemia, rec  rsik reduction   Chicken pox    Diverticulosis    colonoscopy 9/01........SABRADr. Debrah   DM (diabetes mellitus) [facility])    GERD (gastroesophageal reflux disease)    Healthcare maintenance    pneumovax 09/2006, Td 04/2004, CPX June 12, 2010   Hypertension    Microcytic anemia    Morbid obesity (HCC)    all time high 310 2009.  target wt = 208 for BMI <30.  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 His smoking use included cigarettes. He started smoking about 40 years ago. He has a 0.3 pack-year smoking history. He has never used smokeless tobacco. He reports current alcohol use of about 1.0 standard drink of alcohol per week. He reports that he does not use drugs. family history includes Alcohol abuse in his brother and father; Diabetes in  his mother; Throat cancer in his brother. Allergies[1] Medications Ordered Prior to Encounter[2]      ROS:  All others reviewed and negative.  Objective        PE:  BP 136/80 (BP Location: Right Arm, Patient Position: Sitting, Cuff Size: Normal)   Pulse 60   Temp 98.3 F (36.8 C) (Oral)   Ht 5' 11 (1.803 m)   Wt 270 lb (122.5 kg)   SpO2 99%   BMI 37.66 kg/m                 Constitutional: Pt appears in NAD               HENT: Head: NCAT.                Right Ear: External ear normal.                 Left Ear: External ear normal.                Eyes: . Pupils are equal, round, and reactive to light. Conjunctivae and EOM are  normal               Nose: without d/c or deformity               Neck: Neck supple. Gross normal ROM               Cardiovascular: Normal rate and regular rhythm.                 Pulmonary/Chest: Effort normal and breath sounds without rales or wheezing.                Abd:  Soft, NT, ND, + BS, no organomegaly               Neurological: Pt is alert. At baseline orientation, motor grossly intact               Skin: Skin is warm. No rashes, no other new lesions, LE edema - none                Psychiatric: Pt behavior is normal without agitation   Micro: none  Cardiac tracings I have personally interpreted today:  none  Pertinent Radiological findings (summarize): none   Lab Results  Component Value Date   WBC 6.7 04/21/2024   HGB 13.4 04/21/2024   HCT 40.1 04/21/2024   PLT 216.0 04/21/2024   GLUCOSE 71 04/21/2024   CHOL 119 04/21/2024   TRIG 51.0 04/21/2024   HDL 47.40 04/21/2024   LDLCALC 61 04/21/2024   ALT 17 04/21/2024   AST 19 04/21/2024   NA 140 04/21/2024   K 4.4 04/21/2024   CL 106 04/21/2024   CREATININE 1.41 04/21/2024   BUN 13 04/21/2024   CO2 30 04/21/2024   TSH 6.42 (H) 04/21/2024   PSA 0.28 04/21/2024   INR 1.0 08/03/2019   HGBA1C 6.0 04/21/2024   MICROALBUR 12.2 (H) 04/21/2024   Assessment/Plan:  CHRISTOPHR CALIX is a 74 y.o. Black or African American [2] male with  has a past medical history of Atypical chest pain, Chicken pox, Diverticulosis, DM (diabetes mellitus) (HCC), GERD (gastroesophageal reflux disease), Healthcare maintenance, Hypertension, Microcytic anemia, Morbid obesity (HCC), OSA (obstructive sleep apnea), Renal insufficiency, Sleep apnea, and Thyroid  disease.  Vitamin D  deficiency Last vitamin D  Lab Results  Component Value Date   VD25OH 31.59 09/10/2023   Low, to start oral replacement  Hypothyroidism Lab Results  Component Value Date   TSH 6.42 (H) 04/21/2024   uncontrolled, pt to increase levothyroxine  to 88 mcg  qd   Essential hypertension BP Readings from Last 3 Encounters:  04/21/24 136/80  09/10/23 134/82  11/04/22 122/78   Stable, pt to continue medical treatment losartan  100 mg every day, calan  SR 240 qd   Dyslipidemia Lab Results  Component Value Date   LDLCALC 61 04/21/2024   Stable, pt to continue current statin crestor  10 mg qd   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  Lab Results  Component Value Date   HGBA1C 6.0 04/21/2024   Stable, pt to continue current medical treatment jardiance  25 mg every day, glucotrol  2.5 mg every day, metformin  ER 500 mg - 1 every day, ozempic  1 mg weekly   Proteinuria Noted on UA  - now for 24 hr urine total protein, consider nephrology referrall  Encounter for well adult exam with abnormal findings Age and sex appropriate education and counseling updated with regular exercise and diet Referrals for preventative services - pt states will call for eye exam soon Immunizations addressed - none needed Smoking counseling  - none needed Evidence for depression or other mood disorder - none significant Most recent labs reviewed. I have personally reviewed and have noted: 1) the patient's medical and social history 2) The patient's current medications and supplements 3) The patient's height, weight, and BMI have been recorded in the chart   Followup: Return in about 6 months (around 10/19/2024).  Lynwood Rush, MD 04/24/2024 7:34 PM [facility]     [1] No Known Allergies [2]  Current Outpatient Medications on File Prior to Visit  Medication Sig Dispense Refill   acetaminophen  (TYLENOL ) 325 MG tablet Take 650 mg by mouth every 6 (six) hours as needed for moderate pain.      aspirin  81 MG chewable tablet Chew 1 tablet (81 mg total) by mouth 2 (two) times daily. 30 tablet 0   Cholecalciferol  50 MCG (2000 UT) TABS 1 tab by mouth once daily 30 tablet 99    CVS VITAMIN B12 1000 MCG tablet TAKE 1 TABLET BY MOUTH EVERY DAY 30 tablet 0   cyclobenzaprine  (FLEXERIL ) 5 MG tablet Take 1 tablet (5 mg total) by mouth 3 (three) times daily as needed. 40 tablet 1   Insulin  Pen Needle 32G X 4 MM MISC Use to administer Victoza  everyday once daily E11.9 90 each 3   latanoprost  (XALATAN ) 0.005 % ophthalmic solution Place 1 drop into both eyes at bedtime.      No current facility-administered medications on file prior to visit.

## 2024-04-21 NOTE — Patient Instructions (Signed)
 Please remember to call for you yearly eye exam  Please continue all other medications as before, and refills have been done if requested.  Please have the pharmacy call with any other refills you may need.  Please continue your efforts at being more active, low cholesterol diet, and weight control.  You are otherwise up to date with prevention measures today.  Please keep your appointments with your specialists as you may have planned  Please go to the LAB at the blood drawing area for the tests to be done - including the 24 hr urine for protein  You will be contacted by phone if any changes need to be made immediately.  Otherwise, you will receive a letter about your results with an explanation, but please check with MyChart first.  Please make an Appointment to return in 6 months, or sooner if needed

## 2024-04-22 ENCOUNTER — Other Ambulatory Visit: Payer: Self-pay | Admitting: Internal Medicine

## 2024-04-22 ENCOUNTER — Ambulatory Visit: Payer: Self-pay | Admitting: Internal Medicine

## 2024-04-22 MED ORDER — LEVOTHYROXINE SODIUM 88 MCG PO TABS: 88.0000 ug | ORAL_TABLET | Freq: Every day | ORAL | 3 refills | Status: AC

## 2024-04-24 ENCOUNTER — Encounter: Payer: Self-pay | Admitting: Internal Medicine

## 2024-04-24 ENCOUNTER — Other Ambulatory Visit: Payer: Self-pay | Admitting: Internal Medicine

## 2024-04-24 DIAGNOSIS — N069 Isolated proteinuria with unspecified morphologic lesion: Secondary | ICD-10-CM

## 2024-04-24 DIAGNOSIS — R809 Proteinuria, unspecified: Secondary | ICD-10-CM | POA: Insufficient documentation

## 2024-04-24 LAB — EXTRA SPECIMEN

## 2024-04-24 LAB — PROTEIN, URINE, 24 HOUR: Protein, 24H Urine: 429 mg/(24.h) — ABNORMAL HIGH (ref 0–149)

## 2024-04-24 NOTE — Assessment & Plan Note (Signed)
 Noted on UA  - now for 24 hr urine total protein, consider nephrology referrall

## 2024-04-24 NOTE — Assessment & Plan Note (Signed)
 Ckd3a  Lab Results  Component Value Date   HGBA1C 6.0 04/21/2024   Stable, pt to continue current medical treatment jardiance  25 mg every day, glucotrol  2.5 mg every day, metformin  ER 500 mg - 1 every day, ozempic  1 mg weekly

## 2024-04-24 NOTE — Assessment & Plan Note (Signed)
 Last vitamin D  Lab Results  Component Value Date   VD25OH 31.59 09/10/2023   Low, to start oral replacement

## 2024-04-24 NOTE — Assessment & Plan Note (Addendum)
 Lab Results  Component Value Date   TSH 6.42 (H) 04/21/2024   uncontrolled, pt to increase levothyroxine  to 88 mcg qd

## 2024-04-24 NOTE — Assessment & Plan Note (Signed)
 BP Readings from Last 3 Encounters:  04/21/24 136/80  09/10/23 134/82  11/04/22 122/78   Stable, pt to continue medical treatment losartan  100 mg every day, calan  SR 240 qd

## 2024-04-24 NOTE — Assessment & Plan Note (Signed)
 Lab Results  Component Value Date   LDLCALC 61 04/21/2024   Stable, pt to continue current statin crestor  10 mg qd

## 2024-04-24 NOTE — Assessment & Plan Note (Signed)
Age and sex appropriate education and counseling updated with regular exercise and diet Referrals for preventative services - pt states will call for eye exam soon Immunizations addressed - none needed Smoking counseling  - none needed Evidence for depression or other mood disorder - none significant Most recent labs reviewed. I have personally reviewed and have noted: 1) the patient's medical and social history 2) The patient's current medications and supplements 3) The patient's height, weight, and BMI have been recorded in the chart

## 2025-04-12 ENCOUNTER — Ambulatory Visit
